# Patient Record
Sex: Female | Born: 1955
Health system: Southern US, Community
[De-identification: ages and names within clinical notes are randomized; demographics above are authoritative.]

## PROBLEM LIST (undated history)

## (undated) DIAGNOSIS — M1710 Unilateral primary osteoarthritis, unspecified knee: Secondary | ICD-10-CM

## (undated) DIAGNOSIS — C439 Malignant melanoma of skin, unspecified: Secondary | ICD-10-CM

## (undated) DIAGNOSIS — E7800 Pure hypercholesterolemia, unspecified: Secondary | ICD-10-CM

## (undated) DIAGNOSIS — R002 Palpitations: Secondary | ICD-10-CM

## (undated) DIAGNOSIS — I10 Essential (primary) hypertension: Secondary | ICD-10-CM

## (undated) DIAGNOSIS — J189 Pneumonia, unspecified organism: Secondary | ICD-10-CM

## (undated) DIAGNOSIS — R011 Cardiac murmur, unspecified: Secondary | ICD-10-CM

## (undated) DIAGNOSIS — N952 Postmenopausal atrophic vaginitis: Secondary | ICD-10-CM

## (undated) DIAGNOSIS — M1990 Unspecified osteoarthritis, unspecified site: Secondary | ICD-10-CM

## (undated) DIAGNOSIS — E538 Deficiency of other specified B group vitamins: Secondary | ICD-10-CM

## (undated) DIAGNOSIS — M8580 Other specified disorders of bone density and structure, unspecified site: Secondary | ICD-10-CM

## (undated) HISTORY — DX: Unilateral primary osteoarthritis, unspecified knee: M17.10

## (undated) HISTORY — DX: Postmenopausal atrophic vaginitis: N95.2

## (undated) HISTORY — DX: Malignant melanoma of skin, unspecified: C43.9

## (undated) HISTORY — DX: Palpitations: R00.2

## (undated) HISTORY — DX: Pure hypercholesterolemia, unspecified: E78.00

## (undated) HISTORY — DX: Essential (primary) hypertension: I10

## (undated) HISTORY — PX: JOINT REPLACEMENT: SHX530

---
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
## 1999-07-10 ENCOUNTER — Other Ambulatory Visit: Admission: RE | Admit: 1999-07-10 | Discharge: 1999-07-10 | Payer: Self-pay | Admitting: Obstetrics and Gynecology

## 2000-12-05 ENCOUNTER — Emergency Department (HOSPITAL_COMMUNITY): Admission: EM | Admit: 2000-12-05 | Discharge: 2000-12-05 | Payer: Self-pay | Admitting: *Deleted

## 2000-12-11 ENCOUNTER — Ambulatory Visit (HOSPITAL_BASED_OUTPATIENT_CLINIC_OR_DEPARTMENT_OTHER): Admission: RE | Admit: 2000-12-11 | Discharge: 2000-12-11 | Payer: Self-pay | Admitting: Orthopedic Surgery

## 2002-12-25 ENCOUNTER — Encounter: Admission: RE | Admit: 2002-12-25 | Discharge: 2002-12-25 | Payer: Self-pay | Admitting: Obstetrics and Gynecology

## 2002-12-25 ENCOUNTER — Encounter: Payer: Self-pay | Admitting: Obstetrics and Gynecology

## 2003-01-04 ENCOUNTER — Encounter: Admission: RE | Admit: 2003-01-04 | Discharge: 2003-01-04 | Payer: Self-pay | Admitting: Obstetrics and Gynecology

## 2003-01-04 ENCOUNTER — Encounter: Payer: Self-pay | Admitting: Obstetrics and Gynecology

## 2004-03-10 ENCOUNTER — Encounter: Admission: RE | Admit: 2004-03-10 | Discharge: 2004-03-10 | Payer: Self-pay | Admitting: Obstetrics and Gynecology

## 2005-12-03 HISTORY — PX: WRIST SURGERY: SHX841

## 2007-01-03 ENCOUNTER — Encounter (INDEPENDENT_AMBULATORY_CARE_PROVIDER_SITE_OTHER): Payer: Self-pay | Admitting: Specialist

## 2007-01-03 ENCOUNTER — Observation Stay (HOSPITAL_COMMUNITY): Admission: EM | Admit: 2007-01-03 | Discharge: 2007-01-04 | Payer: Self-pay | Admitting: Emergency Medicine

## 2007-04-11 DIAGNOSIS — D229 Melanocytic nevi, unspecified: Secondary | ICD-10-CM

## 2007-04-11 HISTORY — DX: Melanocytic nevi, unspecified: D22.9

## 2007-12-04 HISTORY — PX: KNEE SURGERY: SHX244

## 2007-12-04 HISTORY — PX: APPENDECTOMY: SHX54

## 2008-04-07 ENCOUNTER — Emergency Department (HOSPITAL_COMMUNITY): Admission: EM | Admit: 2008-04-07 | Discharge: 2008-04-08 | Payer: Self-pay | Admitting: Emergency Medicine

## 2008-12-02 ENCOUNTER — Ambulatory Visit (HOSPITAL_BASED_OUTPATIENT_CLINIC_OR_DEPARTMENT_OTHER): Admission: RE | Admit: 2008-12-02 | Discharge: 2008-12-02 | Payer: Self-pay | Admitting: Orthopedic Surgery

## 2010-08-08 ENCOUNTER — Ambulatory Visit: Payer: Self-pay | Admitting: Cardiology

## 2010-10-10 ENCOUNTER — Ambulatory Visit: Payer: Self-pay | Admitting: Cardiology

## 2010-11-22 ENCOUNTER — Ambulatory Visit: Payer: Self-pay | Admitting: Cardiology

## 2011-04-17 NOTE — Op Note (Signed)
NAMESHALIAH, WANN           ACCOUNT NO.:  [id number]   MEDICAL RECORD NO.:  [id number]          PATIENT TYPE:  AMB   LOCATION:  DSC                          FACILITY:  [facility]   PHYSICIAN:  Loreta Ave, M.D. DATE OF BIRTH:  [birth year]   DATE OF PROCEDURE:  12/02/2008  DATE OF DISCHARGE:                               OPERATIVE REPORT   PREOPERATIVE DIAGNOSIS:  Left knee medial meniscus tear with  tricompartmental chondromalacia.   POSTOPERATIVE DIAGNOSIS:  Left knee medial meniscus tear with  tricompartmental chondromalacia with grade 4 changes over half of the  patellofemoral joint, portions of the medial compartment as well as  lateral tibial plateau.   PROCEDURE:  Left knee exam under anesthesia, arthroscopy, partial medial  meniscectomy, and tricompartmental chondroplasty.   SURGEON:  Loreta Ave, MD   ASSISTANT:  Genene Churn. Barry Dienes, [place]   ANESTHESIA:  Knee block with sedation.   SPECIMENS:  None.   CULTURES:  None.   COMPLICATIONS:  None.   DRESSING:  Sterile compressive.   PROCEDURE:  The patient was brought to the operating room and placed on  the operating table in the supine position.  After adequate anesthesia  had been obtained, knee was examined.  Good motion, good stability, good  patellofemoral tracking.  Tourniquet and leg holder were applied.  Leg  was prepped and draped in the usual sterile fashion.  Three portals were  created, one superolateral, one each medial and lateral parapatellar.  Inflow catheter was introduced.  The standard arthroscope was introduced  and knee inspected.  Good patellofemoral tracking.  Diffuse grade 3  changes.  Chondroplasty.  Unfortunately, grade 4 over half of the  patellofemoral joint, medial trochlea, and medial patella.  Cruciate  ligaments were intact.  Lateral meniscus was not torn.  Lateral femoral  condyle looked good.  Some focal deep grade 3, almost grade 4 changes on  half of the plateau was debrided.   Medial compartment revealed complete  detachment of posterior horn of medial meniscus.  Once all stress out,  we removed the most of posterior half, tapered into remaining meniscus.  Grade 3 change throughout compartment and debrided.  Some focal grade 4  changes on the tibia and femur.  Because of the diffuse nature,  microfracture was not indicated.  At completion, all recesses were  examined and all loose fragments were removed.  Instruments and fluid  were  removed.  Portals of knee were injected with Marcaine.  Portals were  closed with 4-0 nylon.  Sterile compressive dressing was applied.  Anesthesia was reversed.  Brought to the recovery room.  Tolerated the  surgery well.  No complications.      Loreta Ave, M.D.  Electronically Signed     DFM/MEDQ  D:  12/02/2008  T:  12/03/2008  Job:  [id number]

## 2011-04-20 NOTE — Op Note (Signed)
Elizabeth Armstrong, Elizabeth Armstrong           ACCOUNT NO.:  [id number]   MEDICAL RECORD NO.:  [id number]          PATIENT TYPE:  INP   LOCATION:  0098                         FACILITY:  [facility]   PHYSICIAN:  Velora Heckler, MD      DATE OF BIRTH:  01-08-1956   DATE OF PROCEDURE:  01/03/2007  DATE OF DISCHARGE:                               OPERATIVE REPORT   PREOPERATIVE DIAGNOSIS:  Acute appendicitis.   POSTOPERATIVE DIAGNOSIS:  Acute appendicitis.   PROCEDURE:  Laparoscopic appendectomy.   SURGEON:  Velora Heckler, M.D., FACS   ANESTHESIA:  General per Dr. Sherrian Divers   ESTIMATED BLOOD LOSS:  Minimal.   PREPARATION:  Betadine.   COMPLICATIONS:  None.   INDICATIONS:  The patient is a 55 year old white female from [place],  [place].  She presents to the emergency department with a five  day history of abdominal discomfort.  The patient had been previously  evaluated on the day of admission by her primary care physician.  She  was sent to [facility] Radiology where a CT scan of the abdomen and  pelvis showed signs of acute appendicitis.  The patient was referred to  [facility] Emergency Department for general surgical evaluation and  management.  The patient was seen and evaluated in the emergency  department.  Physical examination and radiographic findings were  consistent with acute appendicitis.  The patient was prepared and  brought to the operating room.   BODY OF REPORT:  The procedure is done in OR number 1 at the [facility].  The patient is brought to the operating room and placed in a  supine position on the operating room table.  Following administration  of general anesthesia, the patient is prepped and draped in the usual  strict aseptic fashion.  After ascertaining that an adequate level of  anesthesia had been obtained, an infraumbilical incision was made with a  #15 blade.  Dissection was carried down to the fascia.  The fascia is  incised in  the midline and the peritoneal cavity is entered cautiously.  A 0 Vicryl pursestring suture is placed in the fascia.  A Hassan cannula  was introduced and secured with a pursestring suture.  The abdomen was  insufflated with carbon dioxide.  The laparoscope was introduced.  An  operative port is placed in the epigastrium and a second operative port  in the left lower quadrant.  Adhesions to the anterior abdominal wall  were transected with the harmonic scalpel.  The cecum was mobilized.  The small bowel was mobilized off of an inflammatory mass in the right  lower quadrant.  The appendix is exposed.  The appendix was turgid,  distended, consistent with acute appendicitis.  There is no sign of  perforation.  The appendix is mobilized from its lateral peritoneal  adhesions.  It is gently elevated.  The mesoappendix is taken down using  the harmonic scalpel.  Dissection was carried down to the base of the  appendix.  Good visualization of the base of the appendix at its  junction with the cecal wall was obtained.  An endo-GIA stapler was then  inserted and placed across the base of the appendix and fired.  The  appendix was retrieved and placed into an EndoCatch bag and withdrawn  through the left lower quadrant port without difficulty.  The right  lower quadrant was copiously irrigated with warm saline which was  evacuated.  The staple line was inspected.  It is properly positioned on  the cecal wall.  There is no residual appendix.  There is good  hemostasis along the staple line.  Fluid was evacuated from the  peritoneal cavity.  The ports were removed under direct vision and good  hemostasis was noted at all port sites.  Pneumoperitoneum was released  when all ports were removed.  The 0 Vicryl pursestring suture is tied  securely.  The port sites are anesthetized with local anesthetic.  The  wounds were closed with interrupted 4-0 Vicryl subcuticular sutures.  The wounds were washed and  dried and Benzoin and Steri-Strips are  applied.  Sterile dressings were applied.  The patient is awakened from  anesthesia and brought to the recovery room in stable condition.  The  patient tolerated the procedure well.      Velora Heckler, MD  Electronically Signed     TMG/MEDQ  D:  01/03/2007  T:  01/03/2007  Job:  [id number]   cc:   Gloriajean Dell. Andrey Campanile, M.D.  Fax: [phone]

## 2011-04-20 NOTE — Consult Note (Signed)
NAMEJADAH, BOBAK NO.:  [id number]   MEDICAL RECORD NO.:  [id number]          PATIENT TYPE:  EMS   LOCATION:  ED                           FACILITY:  [facility]   PHYSICIAN:  Velora Heckler, MD      DATE OF BIRTH:  [birth year]   DATE OF CONSULTATION:  01/03/2007  DATE OF DISCHARGE:                                 CONSULTATION   CHIEF COMPLAINT:  Abdominal pain, acute appendicitis.   HISTORY OF PRESENT ILLNESS:  Elizabeth Armstrong is a 55 year old white  female from [place], [place].  She presents at the request of  Dr. Benedetto Goad for evaluation of acute appendicitis.  The patient  reports a 5-day illness with mild abdominal discomfort and mild  diarrhea.  She was evaluated, today, by her primary physician.  She was  sent to [facility] Radiology where a CT scan abdomen and pelvis was  obtained.  By report, findings were consistent with acute appendicitis  with a thickened appendix measuring up to 18 mm in diameter.  The  patient was directed to [facility] Emergency Department for general  surgical evaluation and management.  The patient denies any nausea or  vomiting.  She denies any fever or chills.  She has had mild diarrhea.  She localizes significant pain to the right lower quadrant.   PAST MEDICAL HISTORY:  1. Status post cesarean section.  2. Status post ORIF right wrist by Dr. Mckinley Jewel.   MEDICATIONS:  None.   ALLERGIES:  None known.   SOCIAL HISTORY:  The patient is married.  She is to children.  She lives  in [place].  She works as a Transport planner for [employer].  She does not smoke.  She drinks a glass of wine a day.   FAMILY HISTORY:  Notable for coronary artery disease in both parents   REVIEW OF SYSTEMS:  A 15-system review discussed with the patient  without significant other findings.   PHYSICAL EXAMINATION:  GENERAL:  A 55 year old thin white female in no  acute distress on a stretcher in the emergency  department.  VITAL SIGNS:  Temperature 97.8, pulse 65, respirations 20, blood  pressure 136/91, O2 saturation 98% room air.  HEENT:  Shows her to be normocephalic, atraumatic.  Sclerae clear  conjunctiva are clear.  Pupils equal and reactive.  Dentition good.  Mucous membranes moist.  Voice normal.  NECK:  Anterior examination of the neck shows it to be symmetric;  palpation shows no thyroid nodules.  No lymphadenopathy, no tenderness.  LUNGS:  Clear to auscultation bilaterally without rales, rhonchi, or  wheeze.  CARDIAC EXAM:  Shows regular rate and rhythm without murmur.  Peripheral  pulses are full.  EXTREMITIES:  Nontender without edema.  Abdomen is soft without  distension bowel sounds are present.  However, there is marked  tenderness to both percussion and palpation of the right lower quadrant.  There is referred tenderness to the right lower quadrant.  There is  guarding.  There is rebound tenderness.  There is no sign of hernia.  There are no prior surgical wounds, except consistent  with cesarean  section.   LABORATORY STUDIES:  White count 9.1, hemoglobin 13.1, hematocrit 37.8%,  platelet count 321,000.  Differential is normal with 73% neutrophils,  19% lymphocytes, electrolytes are normal.  Prothrombin time is normal at  13.9 with an INR of 1.1, and a PTT of 35.   EKG:  Shows normal sinus rhythm without acute changes.   IMPRESSION:  Acute appendicitis.   PLAN:  1. Admission to [facility].  2. Initiation of intravenous antibiotics.  3. To operating room for appendectomy.  4. Routine postoperative care.   I discussed with the patient the indications for surgery.  I explained  laparoscopic technique versus open surgery.  I quoted her an  approximately a 90% chance of success by laparoscopic technique; and  approximately a 10% chance to conversion to open surgery.  I explained  the need for perioperative antibiotics.  We discussed the hospital stay  to be  expected; and her recovery following surgery.  She understands,  and wishes to proceed.  Will make arrangements with the operating room  immediately.      Velora Heckler, MD  Electronically Signed     TMG/MEDQ  D:  01/03/2007  T:  01/03/2007  Job:  [id number]   cc:   Gloriajean Dell. Andrey Campanile, M.D.  Fax: [phone]

## 2011-06-01 ENCOUNTER — Other Ambulatory Visit: Payer: Self-pay | Admitting: *Deleted

## 2011-09-07 LAB — POCT I-STAT, CHEM 8
BUN: 18 mg/dL (ref 6–23)
Calcium, Ion: 1.17 mmol/L (ref 1.12–1.32)
Chloride: 103 mEq/L (ref 96–112)
HCT: 42 % (ref 36.0–46.0)
Sodium: 139 mEq/L (ref 135–145)

## 2011-09-07 LAB — POCT HEMOGLOBIN-HEMACUE: Hemoglobin: 14 g/dL (ref 12.0–15.0)

## 2011-10-04 ENCOUNTER — Other Ambulatory Visit: Payer: Self-pay | Admitting: Cardiology

## 2011-10-31 ENCOUNTER — Telehealth: Payer: Self-pay | Admitting: Cardiology

## 2011-10-31 NOTE — Telephone Encounter (Signed)
Pt Called asking if Records have been sent to Her primary Doc, I have NO ROI,and have Checked [place] Card Chart No ROI in there neither, I have called Pt back LMOVM to return my call so we can get ROI Signed 10/31/11/km

## 2011-10-31 NOTE — Telephone Encounter (Signed)
All Cardiac (LOV,EKG) faxed to [facility] [place] @ [phone]   10/31/11/km

## 2011-11-03 ENCOUNTER — Other Ambulatory Visit: Payer: Self-pay | Admitting: Cardiology

## 2012-01-16 ENCOUNTER — Other Ambulatory Visit: Payer: Self-pay | Admitting: Cardiology

## 2012-02-11 ENCOUNTER — Other Ambulatory Visit: Payer: Self-pay | Admitting: Cardiology

## 2012-04-11 ENCOUNTER — Encounter: Payer: Self-pay | Admitting: *Deleted

## 2012-08-20 ENCOUNTER — Encounter: Payer: Self-pay | Admitting: *Deleted

## 2012-10-25 ENCOUNTER — Encounter (HOSPITAL_BASED_OUTPATIENT_CLINIC_OR_DEPARTMENT_OTHER): Payer: Self-pay | Admitting: *Deleted

## 2012-10-25 ENCOUNTER — Emergency Department (HOSPITAL_BASED_OUTPATIENT_CLINIC_OR_DEPARTMENT_OTHER)
Admission: EM | Admit: 2012-10-25 | Discharge: 2012-10-25 | Disposition: A | Discharge status: Home / Self Care | Payer: PRIVATE HEALTH INSURANCE | Attending: Emergency Medicine | Admitting: Emergency Medicine

## 2012-10-25 DIAGNOSIS — I10 Essential (primary) hypertension: Secondary | ICD-10-CM | POA: Insufficient documentation

## 2012-10-25 DIAGNOSIS — E7800 Pure hypercholesterolemia, unspecified: Secondary | ICD-10-CM | POA: Insufficient documentation

## 2012-10-25 DIAGNOSIS — Z23 Encounter for immunization: Secondary | ICD-10-CM | POA: Insufficient documentation

## 2012-10-25 DIAGNOSIS — Y929 Unspecified place or not applicable: Secondary | ICD-10-CM | POA: Insufficient documentation

## 2012-10-25 DIAGNOSIS — IMO0001 Reserved for inherently not codable concepts without codable children: Secondary | ICD-10-CM

## 2012-10-25 DIAGNOSIS — W268XXA Contact with other sharp object(s), not elsewhere classified, initial encounter: Secondary | ICD-10-CM | POA: Insufficient documentation

## 2012-10-25 DIAGNOSIS — Z79899 Other long term (current) drug therapy: Secondary | ICD-10-CM | POA: Insufficient documentation

## 2012-10-25 DIAGNOSIS — Y9301 Activity, walking, marching and hiking: Secondary | ICD-10-CM | POA: Insufficient documentation

## 2012-10-25 DIAGNOSIS — S61209A Unspecified open wound of unspecified finger without damage to nail, initial encounter: Secondary | ICD-10-CM | POA: Insufficient documentation

## 2012-10-25 MED ORDER — LIDOCAINE-EPINEPHRINE 2 %-1:100000 IJ SOLN
INTRAMUSCULAR | Status: AC
  Administered 2012-10-25: 15:00:00
  Filled 2012-10-25: qty 1

## 2012-10-25 MED ORDER — TETANUS-DIPHTH-ACELL PERTUSSIS 5-2.5-18.5 LF-MCG/0.5 IM SUSP
0.5000 mL | Freq: Once | INTRAMUSCULAR | Status: AC
  Administered 2012-10-25: 0.5 mL via INTRAMUSCULAR
  Filled 2012-10-25: qty 0.5

## 2012-10-25 NOTE — ED Provider Notes (Signed)
History     CSN: [id number]  Arrival date & time 10/25/12  1415   First MD Initiated Contact with Patient 10/25/12 1421      Chief Complaint  Patient presents with  . Laceration    (Consider location/radiation/quality/duration/timing/severity/associated sxs/prior treatment) HPI Pt presents with complaints of a R index finger laceration that occurred within one hour of presentation. Pt states she was walking her dog when she lost control of the dog, the leash slipped, and cut her R index finger. She states pain is minimal at time of assessment. She denies any other complaints. She is unsure of when she last had a tetanus vaccine.   Past Medical History  Diagnosis Date  . Hypercholesteremia   . HTN (hypertension)     Past Surgical History  Procedure Date  . Cesarean section   . Knee surgery 2009    left  . Wrist surgery 2007    right  . Appendectomy 2009    Family History  Problem Relation Age of Onset  . Heart disease      History  Substance Use Topics  . Smoking status: Never Smoker   . Smokeless tobacco: Not on file  . Alcohol Use: 1.8 oz/week    3 Glasses of wine per week    OB History    Grav Para Term Preterm Abortions TAB SAB Ect Mult Living                  Review of Systems  All other systems reviewed and are negative.    Allergies  Review of patient's allergies indicates no known allergies.  Home Medications   Current Outpatient Rx  Name  Route  Sig  Dispense  Refill  . ATORVASTATIN CALCIUM 10 MG PO TABS      TAKE 1 TABLET BY MOUTH EVERY DAY   30 tablet   5   . BYSTOLIC 5 MG PO TABS      TAKE 1 TABLET EVERY DAY   30 tablet   1     FURTHER REFILLS BY PCP   . HYDROCHLOROTHIAZIDE 25 MG PO TABS   Oral   Take 25 mg by mouth daily.         Marland Kitchen ONE-DAILY MULTI VITAMINS PO TABS   Oral   Take 1 tablet by mouth daily.           BP 141/82  Pulse 60  Temp 97.9 F (36.6 C) (Oral)  Resp 20  Ht 5\' 4"  (1.626 m)  Wt 155 lb  (70.308 kg)  BMI 26.61 kg/m2  SpO2 99%  Physical Exam  Constitutional: She is oriented to person, place, and time. She appears well-developed and well-nourished. No distress.  HENT:  Head: Normocephalic and atraumatic.  Eyes: Pupils are equal, round, and reactive to light.  Neck: Normal range of motion. No tracheal deviation present.  Cardiovascular: Normal rate and regular rhythm.   No murmur heard. Pulmonary/Chest: Effort normal. She has no wheezes. She has no rales.  Abdominal: Soft. Bowel sounds are normal. She exhibits no distension. There is no tenderness.  Musculoskeletal: Normal range of motion. She exhibits no edema.  Neurological: She is alert and oriented to person, place, and time.  Skin:       2cm horizontal laceration along PIP of R index finger. No associated erythema or edema. No underlying connective tissue visible.   Psychiatric: She has a normal mood and affect. Her behavior is normal.    ED Course  Procedures (  including critical care time)  LACERATION REPAIR Performed by: Yanira Tolsma R Authorized by: Kaytelynn Scripter R Consent: Verbal consent obtained. Risks and benefits: risks, benefits and alternatives were discussed Consent given by: patient Patient identity confirmed: provided demographic data Prepped and Draped in normal sterile fashion Wound explored  Laceration Location: R index finger at PIP  Laceration Length: 2cm  No Foreign Bodies seen or palpated  Anesthesia: local infiltration  Local anesthetic: lidocaine 2% with epinephrine  Anesthetic total: 5 ml  Irrigation method: syringe Amount of cleaning: standard  Skin closure: 5-0 ethilon  Number of sutures: 5  Technique: simple interrupted stitch  Patient tolerance: Patient tolerated the procedure well with no immediate complications.  Labs Reviewed - No data to display No results found.   1. Laceration of second finger, right       MDM  Pt's laceration was sutured as  described above. Pt received Tdap vaccinatino as she was unsure as to when her last tetanus vaccine took place. Pt instructed to fu with PCP in 7-10 days for suture removal. Pt instructed to return to ED if she experiences significant swelling, erythema, worsening pain, fever, or other signs of infection.         Elfredia Nevins, MD 10/25/12 1520

## 2012-10-25 NOTE — ED Notes (Signed)
Pt states she was walking the dog with a retractable leash and it cut her right index finger. Approx 2 cm lac to same. Bleeding controlled.

## 2012-10-26 NOTE — ED Provider Notes (Signed)
I reviewed the resident's note and I agree with the findings and plan.     Nelia Shi, MD 10/26/12 [phone]

## 2012-11-05 ENCOUNTER — Other Ambulatory Visit: Payer: Self-pay

## 2012-11-05 MED ORDER — ATORVASTATIN CALCIUM 10 MG PO TABS: 10.0000 mg | ORAL_TABLET | Freq: Every day | ORAL | Status: DC

## 2012-11-06 ENCOUNTER — Other Ambulatory Visit: Payer: Self-pay

## 2012-11-06 MED ORDER — ATORVASTATIN CALCIUM 10 MG PO TABS: 10.0000 mg | ORAL_TABLET | Freq: Every day | ORAL | Status: DC

## 2013-11-05 LAB — HM DEXA SCAN

## 2014-01-15 ENCOUNTER — Other Ambulatory Visit: Payer: Self-pay | Admitting: Physician Assistant

## 2014-01-15 DIAGNOSIS — D039 Melanoma in situ, unspecified: Secondary | ICD-10-CM

## 2014-01-15 DIAGNOSIS — C4491 Basal cell carcinoma of skin, unspecified: Secondary | ICD-10-CM

## 2014-01-15 HISTORY — DX: Melanoma in situ, unspecified: D03.9

## 2014-01-15 HISTORY — DX: Basal cell carcinoma of skin, unspecified: C44.91

## 2014-04-01 LAB — HM MAMMOGRAPHY

## 2014-10-20 ENCOUNTER — Encounter: Payer: Self-pay | Admitting: Family

## 2014-11-19 ENCOUNTER — Encounter: Payer: Self-pay | Admitting: Family

## 2014-11-19 ENCOUNTER — Ambulatory Visit (INDEPENDENT_AMBULATORY_CARE_PROVIDER_SITE_OTHER): Payer: PRIVATE HEALTH INSURANCE | Admitting: Family

## 2014-11-19 VITALS — BP 118/80 | HR 67 | Ht 64.0 in | Wt 132.0 lb

## 2014-11-19 DIAGNOSIS — E7800 Pure hypercholesterolemia, unspecified: Secondary | ICD-10-CM

## 2014-11-19 DIAGNOSIS — M179 Osteoarthritis of knee, unspecified: Secondary | ICD-10-CM

## 2014-11-19 DIAGNOSIS — R300 Dysuria: Secondary | ICD-10-CM

## 2014-11-19 DIAGNOSIS — IMO0002 Reserved for concepts with insufficient information to code with codable children: Secondary | ICD-10-CM

## 2014-11-19 DIAGNOSIS — M1990 Unspecified osteoarthritis, unspecified site: Secondary | ICD-10-CM

## 2014-11-19 DIAGNOSIS — Z0000 Encounter for general adult medical examination without abnormal findings: Secondary | ICD-10-CM

## 2014-11-19 DIAGNOSIS — M1710 Unilateral primary osteoarthritis, unspecified knee: Secondary | ICD-10-CM

## 2014-11-19 DIAGNOSIS — I10 Essential (primary) hypertension: Secondary | ICD-10-CM

## 2014-11-19 HISTORY — DX: Essential (primary) hypertension: I10

## 2014-11-19 HISTORY — DX: Reserved for concepts with insufficient information to code with codable children: IMO0002

## 2014-11-19 HISTORY — DX: Unspecified osteoarthritis, unspecified site: M19.90

## 2014-11-19 HISTORY — DX: Pure hypercholesterolemia, unspecified: E78.00

## 2014-11-19 LAB — POCT URINALYSIS DIPSTICK
BILIRUBIN UA: NEGATIVE
Glucose, UA: NEGATIVE
KETONES UA: NEGATIVE
Nitrite, UA: NEGATIVE
PH UA: 5.5
Protein, UA: NEGATIVE
SPEC GRAV UA: 1.025
Urobilinogen, UA: 0.2

## 2014-11-19 LAB — CBC WITH DIFFERENTIAL/PLATELET
BASOS PCT: 0.8 % (ref 0.0–3.0)
Basophils Absolute: 0.1 10*3/uL (ref 0.0–0.1)
EOS PCT: 2.2 % (ref 0.0–5.0)
Eosinophils Absolute: 0.2 10*3/uL (ref 0.0–0.7)
HEMATOCRIT: 41.3 % (ref 36.0–46.0)
HEMOGLOBIN: 13.6 g/dL (ref 12.0–15.0)
LYMPHS ABS: 2.4 10*3/uL (ref 0.7–4.0)
Lymphocytes Relative: 33.7 % (ref 12.0–46.0)
MCHC: 32.9 g/dL (ref 30.0–36.0)
MCV: 99.3 fl (ref 78.0–100.0)
MONOS PCT: 9 % (ref 3.0–12.0)
Monocytes Absolute: 0.7 10*3/uL (ref 0.1–1.0)
NEUTROS ABS: 3.9 10*3/uL (ref 1.4–7.7)
Neutrophils Relative %: 54.3 % (ref 43.0–77.0)
Platelets: 282 10*3/uL (ref 150.0–400.0)
RBC: 4.15 Mil/uL (ref 3.87–5.11)
RDW: 15.2 % (ref 11.5–15.5)
WBC: 7.2 10*3/uL (ref 4.0–10.5)

## 2014-11-19 LAB — COMPREHENSIVE METABOLIC PANEL
ALT: 12 U/L (ref 0–35)
AST: 19 U/L (ref 0–37)
Albumin: 4.4 g/dL (ref 3.5–5.2)
Alkaline Phosphatase: 81 U/L (ref 39–117)
BUN: 20 mg/dL (ref 6–23)
CALCIUM: 9.6 mg/dL (ref 8.4–10.5)
CHLORIDE: 104 meq/L (ref 96–112)
CO2: 26 meq/L (ref 19–32)
CREATININE: 0.7 mg/dL (ref 0.4–1.2)
GFR: 92.06 mL/min (ref >60.00)
Glucose, Bld: 102 mg/dL — ABNORMAL HIGH (ref 70–99)
Potassium: 3.9 mEq/L (ref 3.5–5.1)
Sodium: 139 mEq/L (ref 135–145)
Total Bilirubin: 0.8 mg/dL (ref 0.2–1.2)
Total Protein: 6.8 g/dL (ref 6.0–8.3)

## 2014-11-19 LAB — LIPID PANEL
Cholesterol: 215 mg/dL — ABNORMAL HIGH (ref 0–200)
HDL: 90.7 mg/dL (ref >39.00)
LDL Cholesterol: 112 mg/dL — ABNORMAL HIGH (ref 0–99)
NONHDL: 124.3
Total CHOL/HDL Ratio: 2
Triglycerides: 64 mg/dL (ref 0.0–149.0)
VLDL: 12.8 mg/dL (ref 0.0–40.0)

## 2014-11-19 NOTE — Addendum Note (Signed)
Addended by: Elmer Picker on: 11/19/2014 01:40 PM   Modules accepted: Orders

## 2014-11-19 NOTE — Patient Instructions (Signed)
Exercise to Stay Healthy Exercise helps you become and stay healthy. EXERCISE IDEAS AND TIPS Choose exercises that:  You enjoy.  Fit into your day. You do not need to exercise really hard to be healthy. You can do exercises at a slow or medium level and stay healthy. You can:  Stretch before and after working out.  Try yoga, Pilates, or tai chi.  Lift weights.  Walk fast, swim, jog, run, climb stairs, bicycle, dance, or rollerskate.  Take aerobic classes. Exercises that burn about 150 calories:  Running 1  miles in 15 minutes.  Playing volleyball for 45 to 60 minutes.  Washing and waxing a car for 45 to 60 minutes.  Playing touch football for 45 minutes.  Walking 1  miles in 35 minutes.  Pushing a stroller 1  miles in 30 minutes.  Playing basketball for 30 minutes.  Raking leaves for 30 minutes.  Bicycling 5 miles in 30 minutes.  Walking 2 miles in 30 minutes.  Dancing for 30 minutes.  Shoveling snow for 15 minutes.  Swimming laps for 20 minutes.  Walking up stairs for 15 minutes.  Bicycling 4 miles in 15 minutes.  Gardening for 30 to 45 minutes.  Jumping rope for 15 minutes.  Washing windows or floors for 45 to 60 minutes. Document Released: 12/22/2010 Document Revised: 02/11/2012 Document Reviewed: 12/22/2010 [facility] Patient Information 2015 [place], [place]. This information is not intended to replace advice given to you by your health care provider. Make sure you discuss any questions you have with your health care provider.

## 2014-11-19 NOTE — Progress Notes (Signed)
Pre visit review using our clinic review tool, if applicable. No additional management support is needed unless otherwise documented below in the visit note. 

## 2014-11-19 NOTE — Progress Notes (Signed)
Subjective:    Patient ID: Elizabeth Armstrong, female    DOB: February 09, 1956, 58 y.o.   MRN: [id number]  HPI  58 year old white female, nonsmoker with a history of osteoarthritis of the knees, status post bilateral knee replacement is in today as a new patient for complete physical exam. She has a history of hypertension and hyperlipidemia. Not taking any statin medications currently. Does not routinely exercise. See gynecology for Pap smear and pelvic exams. Mammogram up-to-date.  Review of Systems  Constitutional: Negative.   HENT: Negative.   Eyes: Negative.   Respiratory: Negative.   Cardiovascular: Negative.   Gastrointestinal: Negative.   Endocrine: Negative.   Genitourinary: Negative.   Musculoskeletal: Negative.   Skin: Negative.   Allergic/Immunologic: Negative.   Neurological: Negative.   Hematological: Negative.   Psychiatric/Behavioral: Negative.    Past Medical History  Diagnosis Date  . Hypercholesteremia   . HTN (hypertension)     History   Social History  . Marital Status: Married    Spouse Name: N/A    Number of Children: 1  . Years of Education: N/A   Occupational History  . Tree surgeon    Social History Main Topics  . Smoking status: Never Smoker   . Smokeless tobacco: Not on file  . Alcohol Use: 1.8 oz/week    3 Glasses of wine per week  . Drug Use: Not on file  . Sexual Activity: Not on file   Other Topics Concern  . Not on file   Social History Narrative    Past Surgical History  Procedure Laterality Date  . Cesarean section    . Knee surgery  2009    left  . Wrist surgery  2007    right  . Appendectomy  2009    Family History  Problem Relation Age of Onset  . Heart disease      No Known Allergies  Current Outpatient Prescriptions on File Prior to Visit  Medication Sig Dispense Refill  . BYSTOLIC 5 MG tablet TAKE 1 TABLET EVERY DAY 30 tablet 1   No current facility-administered medications on file prior to visit.     BP 118/80 mmHg  Pulse 67  Ht 5\' 4"  (1.626 m)  Wt 132 lb (59.875 kg)  BMI 22.65 kg/m2chart    Objective:   Physical Exam  Constitutional: She is oriented to person, place, and time. She appears well-developed and well-nourished.  HENT:  Right Ear: External ear normal.  Left Ear: External ear normal.  Nose: Nose normal.  Mouth/Throat: Oropharynx is clear and moist.  Eyes: Pupils are equal, round, and reactive to light.  Neck: Normal range of motion. Neck supple. No thyromegaly present.  Cardiovascular: Normal rate, regular rhythm and normal heart sounds.   Pulmonary/Chest: Effort normal and breath sounds normal.  Abdominal: Soft. Bowel sounds are normal.  Musculoskeletal: Normal range of motion.  Neurological: She is alert and oriented to person, place, and time. She has normal reflexes.  Skin: Skin is warm and dry.  Psychiatric: She has a normal mood and affect.          Assessment & Plan:  Elizabeth Armstrong was seen today for establish care.  Diagnoses and associated orders for this visit:  Preventative health care - CMP - Lipid Panel - CBC with Differential - TSH - POC Urinalysis Dipstick - EKG 12-Lead  Essential hypertension - CMP - Lipid Panel - CBC with Differential - TSH - POC Urinalysis Dipstick - EKG 12-Lead  Pure hypercholesterolemia -  CMP - Lipid Panel - CBC with Differential - TSH - POC Urinalysis Dipstick - EKG 12-Lead  Osteoarthrosis, unspecified whether generalized or localized, involving lower leg - CMP - Lipid Panel - CBC with Differential - TSH - POC Urinalysis Dipstick    encouraged healthy diet and exercise. We'll follow-up  pending labs, in 6 months and sooner as needed.

## 2014-11-21 LAB — URINE CULTURE
Colony Count: NO GROWTH
ORGANISM ID, BACTERIA: NO GROWTH

## 2014-11-22 ENCOUNTER — Telehealth: Payer: Self-pay | Admitting: Family

## 2014-11-22 ENCOUNTER — Other Ambulatory Visit: Payer: PRIVATE HEALTH INSURANCE

## 2014-11-22 DIAGNOSIS — E038 Other specified hypothyroidism: Secondary | ICD-10-CM

## 2014-11-22 NOTE — Telephone Encounter (Signed)
emmi mailed  °

## 2014-11-23 LAB — TSH: TSH: 0.666 u[IU]/mL (ref 0.350–4.500)

## 2015-01-10 ENCOUNTER — Other Ambulatory Visit: Payer: Self-pay | Admitting: Family Medicine

## 2015-01-11 ENCOUNTER — Telehealth: Payer: Self-pay | Admitting: Family

## 2015-01-11 NOTE — Telephone Encounter (Signed)
I reviewed chart and it looks like she is pretty healthy and had complete physical in December so can make exception. Padonda advised follow up in 6 months at CPE in december. Ok to schedule follow up/transfer visit with me in June if she is ok with this or if no concerns can schedule CPE/new pt visit in December 2016 if she prefers.

## 2015-01-11 NOTE — Telephone Encounter (Signed)
Pt would like to est with dr Maudie Mercury however she does not want to make an appt until she has a problem or needs a cpx. Pt pays out of pocket. Pt is aware must est by April 2016. Please advise

## 2015-01-12 NOTE — Telephone Encounter (Signed)
Pt has been sch

## 2015-08-01 ENCOUNTER — Other Ambulatory Visit: Payer: Self-pay | Admitting: Family

## 2015-08-25 ENCOUNTER — Ambulatory Visit (INDEPENDENT_AMBULATORY_CARE_PROVIDER_SITE_OTHER): Payer: BLUE CROSS/BLUE SHIELD | Admitting: Family Medicine

## 2015-08-25 ENCOUNTER — Encounter: Payer: Self-pay | Admitting: Family Medicine

## 2015-08-25 VITALS — BP 132/88 | HR 68 | Temp 98.1°F | Ht 65.0 in | Wt 142.2 lb

## 2015-08-25 DIAGNOSIS — E7800 Pure hypercholesterolemia, unspecified: Secondary | ICD-10-CM

## 2015-08-25 DIAGNOSIS — I10 Essential (primary) hypertension: Secondary | ICD-10-CM

## 2015-08-25 DIAGNOSIS — R002 Palpitations: Secondary | ICD-10-CM

## 2015-08-25 DIAGNOSIS — C439 Malignant melanoma of skin, unspecified: Secondary | ICD-10-CM

## 2015-08-25 DIAGNOSIS — Z0000 Encounter for general adult medical examination without abnormal findings: Secondary | ICD-10-CM | POA: Diagnosis not present

## 2015-08-25 DIAGNOSIS — M1710 Unilateral primary osteoarthritis, unspecified knee: Secondary | ICD-10-CM

## 2015-08-25 DIAGNOSIS — N952 Postmenopausal atrophic vaginitis: Secondary | ICD-10-CM

## 2015-08-25 DIAGNOSIS — IMO0002 Reserved for concepts with insufficient information to code with codable children: Secondary | ICD-10-CM

## 2015-08-25 DIAGNOSIS — M179 Osteoarthritis of knee, unspecified: Secondary | ICD-10-CM | POA: Diagnosis not present

## 2015-08-25 HISTORY — DX: Postmenopausal atrophic vaginitis: N95.2

## 2015-08-25 HISTORY — DX: Palpitations: R00.2

## 2015-08-25 HISTORY — DX: Malignant melanoma of skin, unspecified: C43.9

## 2015-08-25 LAB — BASIC METABOLIC PANEL
BUN: 15 mg/dL (ref 6–23)
CALCIUM: 9.4 mg/dL (ref 8.4–10.5)
CO2: 26 meq/L (ref 19–32)
CREATININE: 0.79 mg/dL (ref 0.40–1.20)
Chloride: 103 mEq/L (ref 96–112)
GFR: 78.55 mL/min (ref >60.00)
GLUCOSE: 95 mg/dL (ref 70–99)
Potassium: 4.3 mEq/L (ref 3.5–5.1)
Sodium: 139 mEq/L (ref 135–145)

## 2015-08-25 LAB — LIPID PANEL
CHOLESTEROL: 215 mg/dL — AB (ref 0–200)
HDL: 100.6 mg/dL (ref >39.00)
LDL Cholesterol: 103 mg/dL — ABNORMAL HIGH (ref 0–99)
NONHDL: 114.51
TRIGLYCERIDES: 56 mg/dL (ref 0.0–149.0)
Total CHOL/HDL Ratio: 2
VLDL: 11.2 mg/dL (ref 0.0–40.0)

## 2015-08-25 MED ORDER — ESTROGENS, CONJUGATED 0.625 MG/GM VA CREA: 1.0000 | TOPICAL_CREAM | VAGINAL | Status: DC

## 2015-08-25 MED ORDER — NEBIVOLOL HCL 5 MG PO TABS: 5.0000 mg | ORAL_TABLET | Freq: Every day | ORAL | Status: DC

## 2015-08-25 NOTE — Progress Notes (Signed)
Pre visit review using our clinic review tool, if applicable. No additional management support is needed unless otherwise documented below in the visit note. 

## 2015-08-25 NOTE — Progress Notes (Signed)
HPI:  Here to establish a care and for CPE. Insisted on CPE even though done in last 1 year and reports not an issue for her insurance.  -Concerns and/or follow up today:  HTN/Palpitations/Hx mild HLD: -chronic -meds: bystolic, used to see cardiologist with remote eval with normal echo and stress test per her report -great diet and regular exercise -denies: CP, SOB, DOE, swelling, palpitations  Vaginal Atrophy: -uses premarin 1x per month  Hx Melanoma skin ca: -sees dermatologist at least 2x per year for this  OA knees and back: -sees Dr. Arvella Nigh at [facility] per her report -s/p bilat knee replacement -takes celebrex daily but doesn't think needs to  -Diet: variety of foods, balance and well rounded  -Exercise: regular exercise  -Taking folic acid, vitamin D or calcium: no  -Diabetes and Dyslipidemia Screening: FASTING today  -Vaccines: wants flu shot today, wants to check on cost of shingles vaccine  -pap history: done 10/2014 - reports normal  -FDLMP: n/a  -sexual activity: yes, female partner, no new partners  -wants STI testing (Hep C if born [birth year]): no, agreed to hep c screening  -FH breast, colon or ovarian ca: see FH Last mammogram: in last year, does yearly, had breast exam less then 1 year ago Last colon cancer screening: done, reports told to repeat in 5 year   -Alcohol, Tobacco, drug use: see social history  Review of Systems - no fevers, unintentional weight loss, vision loss, hearing loss, chest pain, sob, hemoptysis, melena, hematochezia, hematuria, genital discharge, changing or concerning skin lesions, bleeding, bruising, loc, thoughts of self harm or SI  Past Medical History  Diagnosis Date  . Hypercholesteremia   . HTN (hypertension)   . Osteoarthrosis, unspecified whether generalized or localized, involving lower leg 11/19/2014    of knees, s/p knee replacements bilat, sees [facility]  . Melanoma of skin 08/25/2015    sees dermatologist at  least twice per year, Dr. Denna Haggard  . Palpitations 08/25/2015  . Vaginal atrophy 08/25/2015    Past Surgical History  Procedure Laterality Date  . Cesarean section    . Knee surgery  2009    left  . Wrist surgery  2007    right  . Appendectomy  2009    Family History  Problem Relation Age of Onset  . Heart disease      Social History   Social History  . Marital Status: Married    Spouse Name: N/A  . Number of Children: 1  . Years of Education: N/A   Occupational History  . Tree surgeon    Social History Main Topics  . Smoking status: Never Smoker   . Smokeless tobacco: None  . Alcohol Use: 1.8 oz/week    3 Glasses of wine per week  . Drug Use: None  . Sexual Activity: Not Asked   Other Topics Concern  . 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  conjugated estrogens (PREMARIN) vaginal cream, Place 1 Applicatorful vaginally every 30 (thirty) days., Disp: 30 g, Rfl: 0 .  Magnesium 500 MG TABS, Take by mouth daily., Disp: , Rfl:  .  nebivolol (BYSTOLIC) 5 MG tablet, Take 1 tablet (5 mg total) by mouth  daily., Disp: 90 tablet, Rfl: 3  EXAM:  Filed Vitals:   08/25/15 0810  BP: 132/88  Pulse: 68  Temp: 98.1 F (36.7 C)    GENERAL: vitals reviewed and listed below, alert, oriented, appears well hydrated and in no acute distress  HEENT: head atraumatic, PERRLA, normal appearance of eyes, ears, nose and mouth. moist mucus membranes.  NECK: supple, no masses or lymphadenopathy  LUNGS: clear to auscultation bilaterally, no rales, rhonchi or wheeze  CV: HRRR, no peripheral edema or cyanosis, normal pedal pulses  BREAST: declined, done in last 1 year  ABDOMEN: bowel sounds normal, soft, non tender to palpation, no masses, no rebound or guarding  GU:  declined, done in last 1 year  RECTAL: declined  SKIN: declined, seeing derm this week  MS: normal gait, moves all extremities normally  NEURO: CN II-XII grossly intact, normal muscle strength and sensation to light touch on extremities  PSYCH: normal affect, pleasant and cooperative  ASSESSMENT AND PLAN:  Discussed the following assessment and plan:  Visit for preventive health examination - Plan: Hep C Antibody -Discussed and advised all [place] preventive services health task force level A and B recommendations for age, sex and risks.  -Advised at least 150 minutes of exercise per week and a healthy diet low in saturated fats and sweets and consisting of fresh fruits and vegetables, lean meats such as fish and white chicken and whole grains.  -FASTING labs, studies and vaccines per orders this encounter  Essential hypertension - Plan: Basic metabolic panel  Pure hypercholesterolemia - Plan: Lipid Panel  Osteoarthrosis, unspecified whether generalized or localized, involving lower leg  Melanoma of skin  Vaginal atrophy  Palpitations    Orders Placed This Encounter  Procedures  . Lipid Panel  . Basic metabolic panel  . Hep C Antibody    Patient advised to return to clinic immediately if symptoms worsen or persist or new concerns.  Patient Instructions  BEFORE YOU LEAVE: -flu shot -labs -schedule physical exam next year  -We have ordered labs or studies at this visit. It can take up to 1-2 weeks for results and processing. We will contact you with instructions IF your results are abnormal. Normal results will be released to your [facility]. If you have not heard from [place] or can not find your results in [facility] in 2 weeks please contact our office.  We recommend the following healthy lifestyle measures: - eat a healthy diet consisting of lots of vegetables, fruits, beans, nuts, seeds, healthy meats such as white chicken and fish and whole grains.  - avoid fried foods,  fast food, processed foods, sodas, red meet and other fattening foods.  - get a least 150 minutes of aerobic exercise per week.       No Follow-up on file.  Colin Benton R.

## 2015-08-25 NOTE — Patient Instructions (Addendum)
BEFORE YOU LEAVE: -flu shot -labs -schedule physical exam next year  -We have ordered labs or studies at this visit. It can take up to 1-2 weeks for results and processing. We will contact you with instructions IF your results are abnormal. Normal results will be released to your [facility]. If you have not heard from [place] or can not find your results in [facility] in 2 weeks please contact our office.  We recommend the following healthy lifestyle measures: - eat a healthy diet consisting of lots of vegetables, fruits, beans, nuts, seeds, healthy meats such as white chicken and fish and whole grains.  - avoid fried foods, fast food, processed foods, sodas, red meet and other fattening foods.  - get a least 150 minutes of aerobic exercise per week.

## 2015-08-26 LAB — HEPATITIS C ANTIBODY: HCV Ab: NEGATIVE

## 2015-08-29 ENCOUNTER — Other Ambulatory Visit: Payer: Self-pay | Admitting: Family

## 2015-08-31 NOTE — Telephone Encounter (Signed)
Received a refill request for Celebrex. I only see this medication on the patient's historical med list? Okay to refill?

## 2015-09-20 ENCOUNTER — Other Ambulatory Visit: Payer: Self-pay | Admitting: *Deleted

## 2015-11-15 ENCOUNTER — Other Ambulatory Visit: Payer: Self-pay | Admitting: Physician Assistant

## 2015-11-22 ENCOUNTER — Ambulatory Visit: Payer: PRIVATE HEALTH INSURANCE | Admitting: Family Medicine

## 2015-12-15 ENCOUNTER — Other Ambulatory Visit: Payer: Self-pay | Admitting: Physician Assistant

## 2016-02-04 ENCOUNTER — Other Ambulatory Visit: Payer: Self-pay | Admitting: Family Medicine

## 2016-02-27 ENCOUNTER — Ambulatory Visit (INDEPENDENT_AMBULATORY_CARE_PROVIDER_SITE_OTHER): Payer: BLUE CROSS/BLUE SHIELD | Admitting: *Deleted

## 2016-02-27 DIAGNOSIS — Z23 Encounter for immunization: Secondary | ICD-10-CM | POA: Diagnosis not present

## 2016-04-02 ENCOUNTER — Other Ambulatory Visit: Payer: Self-pay | Admitting: *Deleted

## 2016-04-02 NOTE — Telephone Encounter (Signed)
[facility] [phone] Rx request Celecoxib 200 mg, take one capsule by mouth twice daily, #60

## 2016-04-03 NOTE — Telephone Encounter (Signed)
Notification faxed to pharmacy

## 2016-04-03 NOTE — Telephone Encounter (Signed)
I believe her orthopedic doc prescribes this? I do not usually advise for regular long term use due to side effects and risks. Would suggest tylenol [id number] mg up to 3 times daily as needed for pain.

## 2016-08-18 ENCOUNTER — Other Ambulatory Visit: Payer: Self-pay | Admitting: Family Medicine

## 2016-09-05 ENCOUNTER — Ambulatory Visit (INDEPENDENT_AMBULATORY_CARE_PROVIDER_SITE_OTHER): Payer: BLUE CROSS/BLUE SHIELD

## 2016-09-05 DIAGNOSIS — Z23 Encounter for immunization: Secondary | ICD-10-CM

## 2016-09-17 ENCOUNTER — Other Ambulatory Visit: Payer: Self-pay | Admitting: Family Medicine

## 2016-09-18 ENCOUNTER — Other Ambulatory Visit: Payer: Self-pay | Admitting: *Deleted

## 2016-09-18 MED ORDER — NEBIVOLOL HCL 5 MG PO TABS: 5.0000 mg | ORAL_TABLET | Freq: Every day | ORAL | 0 refills | Status: DC

## 2016-09-18 NOTE — Telephone Encounter (Signed)
Rx done. 

## 2016-10-18 ENCOUNTER — Other Ambulatory Visit: Payer: Self-pay | Admitting: Family Medicine

## 2016-11-12 ENCOUNTER — Encounter: Payer: BLUE CROSS/BLUE SHIELD | Admitting: Family Medicine

## 2016-11-16 ENCOUNTER — Other Ambulatory Visit: Payer: Self-pay | Admitting: Family Medicine

## 2016-12-07 ENCOUNTER — Other Ambulatory Visit: Payer: Self-pay | Admitting: Physician Assistant

## 2016-12-12 ENCOUNTER — Other Ambulatory Visit: Payer: Self-pay | Admitting: Family Medicine

## 2016-12-31 ENCOUNTER — Encounter: Payer: Self-pay | Admitting: Family Medicine

## 2016-12-31 ENCOUNTER — Ambulatory Visit (INDEPENDENT_AMBULATORY_CARE_PROVIDER_SITE_OTHER): Payer: BLUE CROSS/BLUE SHIELD | Admitting: Family Medicine

## 2016-12-31 VITALS — BP 132/88 | HR 74 | Temp 98.1°F | Ht 65.0 in | Wt 148.3 lb

## 2016-12-31 DIAGNOSIS — J069 Acute upper respiratory infection, unspecified: Secondary | ICD-10-CM | POA: Diagnosis not present

## 2016-12-31 MED ORDER — BENZONATATE 100 MG PO CAPS: 100.0000 mg | ORAL_CAPSULE | Freq: Two times a day (BID) | ORAL | 0 refills | Status: DC | PRN

## 2016-12-31 NOTE — Progress Notes (Signed)
HPI:  URI: -started: 6 days ago -symptoms:nasal congestion, sore throat, cough, mild diarrhea -denies:fever, SOB, NVD, tooth pain -has tried: tylenol and sudafed -sick contacts/travel/risks: no reported flu, strep or tick exposure -has physical in a few days  ROS: See pertinent positives and negatives per HPI.  Past Medical History:  Diagnosis Date  . HTN (hypertension)   . Hypercholesteremia   . Melanoma of skin ([place]) 08/25/2015   sees dermatologist at least twice per year, Dr. Denna Haggard  . Osteoarthrosis, unspecified whether generalized or localized, involving lower leg 11/19/2014   of knees, s/p knee replacements bilat, sees [facility]  . Palpitations 08/25/2015  . Vaginal atrophy 08/25/2015    Past Surgical History:  Procedure Laterality Date  . APPENDECTOMY  2009  . CESAREAN SECTION    . KNEE SURGERY  2009   left  . WRIST SURGERY  2007   right    Family History  Problem Relation Age of Onset  . Heart disease      Social History   Social History  . Marital status: Married    Spouse name: N/A  . Number of children: 1  . Years of education: N/A   Occupational History  . Tree surgeon    Social History Main Topics  . Smoking status: Never Smoker  . Smokeless tobacco: Never Used  . Alcohol use 1.8 oz/week    3 Glasses of wine per week  . Drug use: Unknown  . Sexual activity: Not Asked   Other Topics Concern  . 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  BYSTOLIC 5 MG tablet, TAKE 1 TABLET (5 MG TOTAL) BY MOUTH DAILY., Disp: 30 tablet, Rfl: 0 .  Magnesium 500 MG TABS, Take by mouth daily., Disp: , Rfl:  .  PREMARIN vaginal cream, INSERT 1 APPLICATORFUL VAGINALLY EVERY 30 (THIRTY) DAYS., Disp: 30 g, Rfl: 1 .  benzonatate (TESSALON) 100 MG capsule,  Take 1 capsule (100 mg total) by mouth 2 (two) times daily as needed for cough., Disp: 20 capsule, Rfl: 0  EXAM:  Vitals:   12/31/16 1105  BP: 132/88  Pulse: 74  Temp: 98.1 F (36.7 C)    Body mass index is 24.68 kg/m.  GENERAL: vitals reviewed and listed above, alert, oriented, appears well hydrated and in no acute distress  HEENT: atraumatic, conjunttiva clear, no obvious abnormalities on inspection of external nose and ears, normal appearance of ear canals and TMs, clear nasal congestion, mild post oropharyngeal erythema with PND, no tonsillar edema or exudate, no sinus TTP  NECK: no obvious masses on inspection  LUNGS: clear to auscultation bilaterally, no wheezes, rales or rhonchi, good air movement  CV: HRRR, no peripheral edema  MS: moves all extremities without noticeable abnormality  PSYCH: pleasant and cooperative, no obvious depression or anxiety  ASSESSMENT AND PLAN:  Discussed the following assessment and plan:  Upper respiratory tract infection, unspecified type  -given HPI and exam findings today, a serious infection or illness is unlikely. We discussed potential etiologies, with VURI being most likely, and advised supportive care and monitoring. We discussed treatment side effects, likely course, antibiotic misuse, transmission, and signs of developing a serious illness. -of course, we advised  to return or notify a doctor immediately if symptoms worsen or persist or new concerns arise.    Patient Instructions  INSTRUCTIONS FOR UPPER RESPIRATORY INFECTION:  -plenty of rest and fluids  -nasal saline wash 2-3 times daily (use prepackaged nasal saline or bottled/distilled water if making your own)   -can use tylenol (in no history of liver disease) or ibuprofen (if no history of kidney disease, bowel bleeding or significant heart disease) as directed for aches and sorethroat  -in the winter time, using a humidifier at night is helpful (please follow  cleaning instructions)  -if you are taking a cough medication - use only as directed, may also try a teaspoon of honey to coat the throat and throat lozenges.   -for sore throat, salt water gargles can help  -follow up if you have fevers, facial pain, tooth pain, difficulty breathing or are worsening or symptoms persist longer then expected  Upper Respiratory Infection, Adult An upper respiratory infection (URI) is also known as the common cold. It is often caused by a type of germ (virus). Colds are easily spread (contagious). You can pass it to others by kissing, coughing, sneezing, or drinking out of the same glass. Usually, you get better in 1 to 3  weeks.  However, the cough can last for even longer. HOME CARE   Only take medicine as told by your doctor. Follow instructions provided above.  Drink enough water and fluids to keep your pee (urine) clear or pale yellow.  Get plenty of rest.  Return to work when your temperature is < 100 for 24 hours or as told by your doctor. You may use a face mask and wash your hands to stop your cold from spreading. GET HELP RIGHT AWAY IF:   After the first few days, you feel you are getting worse.  You have questions about your medicine.  You have chills, shortness of breath, or red spit (mucus).  You have pain in the face for more then 1-2 days, especially when you bend forward.  You have a fever, puffy (swollen) neck, pain when you swallow, or white spots in the back of your throat.  You have a bad headache, ear pain, sinus pain, or chest pain.  You have a high-pitched whistling sound when you breathe in and out (wheezing).  You cough up blood.  You have sore muscles or a stiff neck. MAKE SURE YOU:   Understand these instructions.  Will watch your condition.  Will get help right away if you are not doing well or get worse. Document Released: 05/07/2008 Document Revised: 02/11/2012 Document Reviewed: 02/24/2014 [facility] Patient  Information 2015 [place], [place]. This information is not intended to replace advice given to you by your health care provider. Make sure you discuss any questions you have with your health care provider.    Colin Benton R., DO

## 2016-12-31 NOTE — Patient Instructions (Signed)
INSTRUCTIONS FOR UPPER RESPIRATORY INFECTION:  -plenty of rest and fluids  -nasal saline wash 2-3 times daily (use prepackaged nasal saline or bottled/distilled water if making your own)   -can use tylenol (in no history of liver disease) or ibuprofen (if no history of kidney disease, bowel bleeding or significant heart disease) as directed for aches and sorethroat  -in the winter time, using a humidifier at night is helpful (please follow cleaning instructions)  -if you are taking a cough medication - use only as directed, may also try a teaspoon of honey to coat the throat and throat lozenges.   -for sore throat, salt water gargles can help  -follow up if you have fevers, facial pain, tooth pain, difficulty breathing or are worsening or symptoms persist longer then expected  Upper Respiratory Infection, Adult An upper respiratory infection (URI) is also known as the common cold. It is often caused by a type of germ (virus). Colds are easily spread (contagious). You can pass it to others by kissing, coughing, sneezing, or drinking out of the same glass. Usually, you get better in 1 to 3  weeks.  However, the cough can last for even longer. HOME CARE   Only take medicine as told by your doctor. Follow instructions provided above.  Drink enough water and fluids to keep your pee (urine) clear or pale yellow.  Get plenty of rest.  Return to work when your temperature is < 100 for 24 hours or as told by your doctor. You may use a face mask and wash your hands to stop your cold from spreading. GET HELP RIGHT AWAY IF:   After the first few days, you feel you are getting worse.  You have questions about your medicine.  You have chills, shortness of breath, or red spit (mucus).  You have pain in the face for more then 1-2 days, especially when you bend forward.  You have a fever, puffy (swollen) neck, pain when you swallow, or white spots in the back of your throat.  You have a bad  headache, ear pain, sinus pain, or chest pain.  You have a high-pitched whistling sound when you breathe in and out (wheezing).  You cough up blood.  You have sore muscles or a stiff neck. MAKE SURE YOU:   Understand these instructions.  Will watch your condition.  Will get help right away if you are not doing well or get worse. Document Released: 05/07/2008 Document Revised: 02/11/2012 Document Reviewed: 02/24/2014 [employer] Patient Information 2015 [employer]. This information is not intended to replace advice given to you by your health care provider. Make sure you discuss any questions you have with your health care provider.  

## 2016-12-31 NOTE — Progress Notes (Signed)
Pre visit review using our clinic review tool, if applicable. No additional management support is needed unless otherwise documented below in the visit note. 

## 2017-01-04 ENCOUNTER — Encounter: Payer: Self-pay | Admitting: Family Medicine

## 2017-01-04 ENCOUNTER — Ambulatory Visit (INDEPENDENT_AMBULATORY_CARE_PROVIDER_SITE_OTHER)
Admission: RE | Admit: 2017-01-04 | Discharge: 2017-01-04 | Disposition: A | Discharge status: Home / Self Care | Payer: BLUE CROSS/BLUE SHIELD | Source: Ambulatory Visit | Attending: Family Medicine | Admitting: Family Medicine

## 2017-01-04 ENCOUNTER — Ambulatory Visit (INDEPENDENT_AMBULATORY_CARE_PROVIDER_SITE_OTHER): Payer: BLUE CROSS/BLUE SHIELD | Admitting: Family Medicine

## 2017-01-04 VITALS — BP 122/80 | HR 52 | Temp 97.9°F | Ht 65.25 in | Wt 149.4 lb

## 2017-01-04 DIAGNOSIS — R059 Cough, unspecified: Secondary | ICD-10-CM

## 2017-01-04 DIAGNOSIS — M8580 Other specified disorders of bone density and structure, unspecified site: Secondary | ICD-10-CM | POA: Insufficient documentation

## 2017-01-04 DIAGNOSIS — R05 Cough: Secondary | ICD-10-CM

## 2017-01-04 DIAGNOSIS — J988 Other specified respiratory disorders: Secondary | ICD-10-CM | POA: Diagnosis not present

## 2017-01-04 HISTORY — DX: Other specified disorders of bone density and structure, unspecified site: M85.80

## 2017-01-04 MED ORDER — DOXYCYCLINE HYCLATE 100 MG PO CAPS: 100.0000 mg | ORAL_CAPSULE | Freq: Two times a day (BID) | ORAL | 0 refills | Status: DC

## 2017-01-04 NOTE — Patient Instructions (Signed)
BEFORE YOU LEAVE: -xray sheet -follow up: 1) reschedule physical to any time that is good for her in next 2 months  Go get the chest xray  Start the doxycycline (antibiotic)  Seek care if worsening, new concerns or symptoms persist despite treatment.

## 2017-01-04 NOTE — Progress Notes (Signed)
HPI:  Here for physical but prefers to change to sick visit as is not feeling well: -started: 10 days ago -symptoms:nasal congestion, sore throat, cough - now worsening with thick yellow mucus, sinus pain, upper tooth pain, bilat lowe rrib pain with coughing and deep breathing, wheezy feeling at times -denies:fever, SOB, NVD -has tried: tessalon, nyquil, delsum -sick contacts/travel/risks: no reported flu, strep or tick exposure  ROS: See pertinent positives and negatives per HPI.  Past Medical History:  Diagnosis Date  . HTN (hypertension)   . Hypercholesteremia   . Melanoma of skin ([place]) 08/25/2015   sees dermatologist at least twice per year, Dr. Denna Haggard  . Osteoarthrosis, unspecified whether generalized or localized, involving lower leg 11/19/2014   of knees, s/p knee replacements bilat, sees [facility]  . Palpitations 08/25/2015  . Vaginal atrophy 08/25/2015    Past Surgical History:  Procedure Laterality Date  . APPENDECTOMY  2009  . CESAREAN SECTION    . KNEE SURGERY  2009   left  . WRIST SURGERY  2007   right    Family History  Problem Relation Age of Onset  . Heart disease      Social History   Social History  . Marital status: Married    Spouse name: N/A  . Number of children: 1  . Years of education: N/A   Occupational History  . Tree surgeon    Social History Main Topics  . Smoking status: Never Smoker  . Smokeless tobacco: Never Used  . Alcohol use 1.8 oz/week    3 Glasses of wine per week  . Drug use: Unknown  . Sexual activity: Not Asked   Other Topics Concern  . 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  benzonatate (TESSALON) 100 MG capsule, Take 1 capsule (100 mg total) by mouth 2 (two) times daily as needed for cough., Disp: 20  capsule, Rfl: 0 .  BYSTOLIC 5 MG tablet, TAKE 1 TABLET (5 MG TOTAL) BY MOUTH DAILY., Disp: 30 tablet, Rfl: 0 .  Magnesium 500 MG TABS, Take by mouth daily., Disp: , Rfl:  .  PREMARIN vaginal cream, INSERT 1 APPLICATORFUL VAGINALLY EVERY 30 (THIRTY) DAYS., Disp: 30 g, Rfl: 1 .  doxycycline (VIBRAMYCIN) 100 MG capsule, Take 1 capsule (100 mg total) by mouth 2 (two) times daily., Disp: 20 capsule, Rfl: 0  EXAM:  Vitals:   01/04/17 1130  BP: 122/80  Pulse: (!) 52  Temp: 97.9 F (36.6 C)    Body mass index is 24.67 kg/m.  GENERAL: vitals reviewed and listed above, alert, oriented, appears well hydrated and in no acute distress  HEENT: atraumatic, conjunttiva clear, no obvious abnormalities on inspection of external nose and ears, normal appearance of ear canals and TMs, thick nasal congestion, mild post oropharyngeal erythema with PND, no tonsillar edema or exudate, no sinus TTP  NECK: no obvious masses on inspection  LUNGS: clear to auscultation bilaterally, no wheezes, rales or rhonchi, good air movement  CV: HRRR, no peripheral edema  MS: moves all extremities without noticeable abnormality  PSYCH: pleasant and cooperative, no obvious depression or anxiety  ASSESSMENT AND PLAN:  Discussed the following assessment and plan:  Cough -  Plan: DG Chest 2 View  Respiratory infection  We discussed potential etiologies, with VURI and now with 2ndary bacterial sinusitis and/or bronchitis being most likely. We discussed treatment side effects, likely course, return precuations and signs of developing a serious illness. Opted for CXR to eval for LRI. Doxy should cover this as well - may add prednisone if bronchitis or wheezing continues - no wheezing on exam. -she plans to reschedule physical. -of course, we advised to return or notify a doctor immediately if symptoms worsen or persist or new concerns arise.    Patient Instructions  BEFORE YOU LEAVE: -xray sheet -follow up: 1)  reschedule physical to any time that is good for her in next 2 months  Go get the chest xray  Start the doxycycline (antibiotic)  Seek care if worsening, new concerns or symptoms persist despite treatment.    Colin Benton R., DO

## 2017-01-04 NOTE — Progress Notes (Signed)
Pre visit review using our clinic review tool, if applicable. No additional management support is needed unless otherwise documented below in the visit note. 

## 2017-01-10 ENCOUNTER — Other Ambulatory Visit: Payer: Self-pay | Admitting: *Deleted

## 2017-01-10 DIAGNOSIS — M8580 Other specified disorders of bone density and structure, unspecified site: Secondary | ICD-10-CM

## 2017-01-13 ENCOUNTER — Other Ambulatory Visit: Payer: Self-pay | Admitting: Family Medicine

## 2017-03-06 NOTE — Progress Notes (Deleted)
HPI:  Here for CPE:  -Concerns and/or follow up today: last CPE 08/2015 HTN/Palpitations/Hx mild HLD: -chronic -meds: bystolic, used to see cardiologist with remote eval with normal echo and stress test per her report  Vaginal Atrophy: -uses premarin 1x per month  Hx Melanoma skin ca: -sees dermatologist at least 2x per year for this  OA knees and back: -sees Dr. Arvella Nigh at [facility] per her report -s/p bilat knee replacement  -Diet: variety of foods, balance and well rounded, larger portion sizes  -Exercise: no regular exercise  -Taking folic acid, vitamin D or calcium: no  -Diabetes and Dyslipidemia Screening:  -Hx of HTN: no  -Vaccines: UTD  -pap history:  -FDLMP:  -sexual activity: yes, female partner, no new partners  -wants STI testing (Hep C if born [birth year]-65): no  -FH breast, colon or ovarian ca: see FH Last mammogram: Last colon cancer screening:  Breast Ca Risk Assessment: -[email or web address].it  Genetic Counseling Screen: Http://www.breastcancergenesscreen.org/startScreen.aspx  FRAX (50-65):  DEXA (>/= 65):   -Alcohol, Tobacco, drug use: see social history  Review of Systems - no fevers, unintentional weight loss, vision loss, hearing loss, chest pain, sob, hemoptysis, melena, hematochezia, hematuria, genital discharge, changing or concerning skin lesions, bleeding, bruising, loc, thoughts of self harm or SI  Past Medical History:  Diagnosis Date  . HTN (hypertension)   . Hypercholesteremia   . Melanoma of skin ([place]) 08/25/2015   sees dermatologist at least twice per year, Dr. Denna Haggard  . Osteoarthrosis, unspecified whether generalized or localized, involving lower leg 11/19/2014   of knees, s/p knee replacements bilat, sees [facility]  . Palpitations 08/25/2015  . Vaginal atrophy 08/25/2015    Past Surgical History:  Procedure Laterality Date  . APPENDECTOMY  2009  . CESAREAN SECTION    . KNEE SURGERY  2009   left  . WRIST  SURGERY  2007   right    Family History  Problem Relation Age of Onset  . Heart disease      Social History   Social History  . Marital status: Married    Spouse name: N/A  . Number of children: 1  . Years of education: N/A   Occupational History  . Tree surgeon    Social History Main Topics  . Smoking status: Never Smoker  . Smokeless tobacco: Never Used  . Alcohol use 1.8 oz/week    3 Glasses of wine per week  . Drug use: Unknown  . Sexual activity: Not on file   Other Topics Concern  . 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  benzonatate (TESSALON) 100 MG capsule, Take 1 capsule (100 mg total) by mouth 2 (two) times daily as needed for cough., Disp: 20 capsule, Rfl: 0 .  BYSTOLIC 5 MG tablet, TAKE 1 TABLET BY MOUTH DAILY, Disp: 30 tablet, Rfl: 5 .  doxycycline (VIBRAMYCIN) 100 MG capsule, Take 1 capsule (100 mg total) by mouth 2 (two) times daily., Disp: 20 capsule, Rfl: 0 .  Magnesium 500 MG TABS, Take by mouth daily., Disp: , Rfl:  .  PREMARIN vaginal cream, INSERT 1 APPLICATORFUL VAGINALLY EVERY 30 (THIRTY) DAYS., Disp: 30 g, Rfl: 1  EXAM:  There were no  vitals filed for this visit.  GENERAL: vitals reviewed and listed below, alert, oriented, appears well hydrated and in no acute distress  HEENT: head atraumatic, PERRLA, normal appearance of eyes, ears, nose and mouth. moist mucus membranes.  NECK: supple, no masses or lymphadenopathy  LUNGS: clear to auscultation bilaterally, no rales, rhonchi or wheeze  CV: HRRR, no peripheral edema or cyanosis, normal pedal pulses  ABDOMEN: bowel sounds normal, soft, non tender to palpation, no masses, no rebound or guarding  SKIN: no rash or abnormal lesions  MS: normal gait, moves all extremities  normally  NEURO: normal gait, speech and thought processing grossly intact, muscle tone grossly intact throughout  PSYCH: normal affect, pleasant and cooperative  ASSESSMENT AND PLAN:  Discussed the following assessment and plan:  There are no diagnoses linked to this encounter.  -Discussed and advised all [place] preventive services health task force level A and B recommendations for age, sex and risks.  -Advised at least 150 minutes of exercise per week and a healthy diet with avoidance of (less then 1 serving per week) processed foods, white starches, red meat, fast foods and sweets and consisting of: * 5-9 servings of fresh fruits and vegetables (not corn or potatoes) *nuts and seeds, beans *olives and olive oil *lean meats such as fish and white chicken  *whole grains  -labs, studies and vaccines per orders this encounter  No orders of the defined types were placed in this encounter.   Patient advised to return to clinic immediately if symptoms worsen or persist or new concerns.  There are no Patient Instructions on file for this visit.  No Follow-up on file.  Colin Benton R., DO

## 2017-03-07 ENCOUNTER — Other Ambulatory Visit: Payer: Self-pay | Admitting: Physician Assistant

## 2017-03-07 ENCOUNTER — Encounter: Payer: BLUE CROSS/BLUE SHIELD | Admitting: Family Medicine

## 2017-03-07 DIAGNOSIS — C4491 Basal cell carcinoma of skin, unspecified: Secondary | ICD-10-CM

## 2017-03-07 HISTORY — DX: Basal cell carcinoma of skin, unspecified: C44.91

## 2017-03-07 LAB — HM DEXA SCAN

## 2017-03-07 LAB — HM MAMMOGRAPHY

## 2017-03-12 ENCOUNTER — Encounter: Payer: Self-pay | Admitting: Family Medicine

## 2017-03-12 DIAGNOSIS — M810 Age-related osteoporosis without current pathological fracture: Secondary | ICD-10-CM

## 2017-03-15 ENCOUNTER — Telehealth: Payer: Self-pay | Admitting: Family Medicine

## 2017-03-15 NOTE — Telephone Encounter (Signed)
Received results of bone density.  Now has osteoporosis.  Updated in chart.  Papers given to Dr. Maudie Mercury.  Message left for me that stated to advise the pt to take VitD3 1000 in daily.  Take calcium 1200 mg daily.  If she is willing to consider medication than come in for an appointment to discuss.  Will call patient.

## 2017-03-19 ENCOUNTER — Encounter: Payer: Self-pay | Admitting: Family Medicine

## 2017-03-20 NOTE — Telephone Encounter (Signed)
Spoke to the pt and informed her of results.  She reports that she has been taking Vitamin D 500 units daily.  Advised her to increase to 1000 units daily.  Also advise that she start calcium 1200 mg daily.  Pt has an appt on 03/21/17 and will discuss medication at that time.  Vitamin D and calcium added to the medication list.

## 2017-03-20 NOTE — Progress Notes (Signed)
HPI:  Here for CPE: Rescheduled when I was sick. Sees dermatologist for skin checks  -Concerns and/or follow up today:  VURI illness last week, now much better.  Osteoporosis, mild comp rx: -dexa 03/2017 with osteoporosis -she does not want to take medications -did start Vit D3 1000 and is trying to get more consistent wt bearing exercise -agrees to monitoring and considering medication if not improving  HTN/Palpitations/Hx mild HLD: -chronic -meds: bystolic, used to see cardiologist with remote eval with normal echo and stress test per her report  Vaginal Atrophy: -uses premarin 1x per month  Hx Melanoma skin ca: -sees dermatologist at least 2x per year for this  OA knees and back: -sees [facility]. [facility]ella [facility] at [facility] per her report -s/p bilat knee replacement  -Diabetes and Dyslipidemia Screening: fasting for labs  -Hx of HTN: no  -Vaccines: UTD  -pap history: 10/2014 per her report and normal - wishes to do today  -FDLMP: n/a  -sexual activity: yes, female partner, no new partners  -wants STI testing (Hep C if born 30-65): no  -FH breast, colon or ovarian ca: see FH Last mammogram: done 2018 Last colon cancer screening: due in December per pt  -Alcohol, Tobacco, drug use: see social history  Review of Systems - no fevers, unintentional weight loss, vision loss, hearing loss, chest pain, sob, hemoptysis, melena, hematochezia, hematuria, genital discharge, changing or concerning skin lesions, bleeding, bruising, loc, thoughts of self harm or SI  Past Medical History:  Diagnosis Date  . HTN (hypertension)   . Hypercholesteremia   . Melanoma of skin ([place]) 08/25/2015   sees dermatologist at least twice per year, Dr. Denna Haggard  . Osteoarthrosis, unspecified whether generalized or localized, involving lower leg 11/19/2014   of knees, s/p knee replacements bilat, sees [facility]  . Palpitations 08/25/2015  . Vaginal atrophy 08/25/2015    Past Surgical History:   Procedure Laterality Date  . APPENDECTOMY  2009  . CESAREAN SECTION    . KNEE SURGERY  2009   left  . WRIST SURGERY  2007   right    Family History  Problem Relation Age of Onset  . Heart disease      Social History   Social History  . Marital status: Married    Spouse name: N/A  . Number of children: 1  . Years of education: N/A   Occupational History  . Tree surgeon    Social History Main Topics  . Smoking status: Never Smoker  . Smokeless tobacco: Never Used  . Alcohol use 1.8 oz/week    3 Glasses of wine per week  . Drug use: Unknown  . Sexual activity: Not Asked   Other Topics Concern  . 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  BYSTOLIC 5 MG tablet, TAKE 1 TABLET BY MOUTH DAILY, Disp: 30 tablet, Rfl: 5 .  CALCIUM PO, Take 1,200 mg by mouth daily., Disp: , Rfl:  .  Cholecalciferol (VITAMIN D3 PO), Take 1,000 Units by mouth daily., Disp: , Rfl:  .  Magnesium 500 MG TABS, Take by mouth daily., Disp: , Rfl:  .  PREMARIN vaginal cream, INSERT 1 APPLICATORFUL VAGINALLY EVERY 30 (THIRTY) DAYS., Disp: 30 g,  Rfl: 1  EXAM:  Vitals:   03/21/17 0727  BP: 120/82  Pulse: 62  Temp: 98.2 F (36.8 C)    GENERAL: vitals reviewed and listed below, alert, oriented, appears well hydrated and in no acute distress  HEENT: head atraumatic, PERRLA, normal appearance of eyes, ears, nose and mouth. moist mucus membranes.  NECK: supple, no masses or lymphadenopathy  LUNGS: clear to auscultation bilaterally, no rales, rhonchi or wheeze  CV: HRRR, no peripheral edema or cyanosis, normal pedal pulses  BREAST: normal appearance - no skin lesions or discharge noted on inspection of both breasts, on palpation of both breast and axillary region no suspicious lesions  appreciated today  GU: normal appearance of external genitalia - no lesions or masses appreciated, normal appearing vaginal mucosa - no abnormal discharge, normal appearance of cervix - no lesions or abnormal discharge observed  RECTAL: deferred  ABDOMEN: bowel sounds normal, soft, non tender to palpation, no masses, no rebound or guarding  SKIN: no rash or abnormal lesions  MS: normal gait, moves all extremities normally  NEURO: normal gait, speech and thought processing grossly intact, muscle tone grossly intact throughout  PSYCH: normal affect, pleasant and cooperative  ASSESSMENT AND PLAN:  Discussed the following assessment and plan:  Encounter for preventive health examination - Plan: Hemoglobin A1c  Essential hypertension - Plan: Basic metabolic panel, CBC  Pure hypercholesterolemia - Plan: Lipid panel  Melanoma of skin (HCC)  Osteoporosis, unspecified osteoporosis type, unspecified pathological fracture presence  -discussed dexa and tx/eval options. She prefers to do vit d, adequate cal and exercise. Advised recheck in 2 years. She declined medications at this time.  -Discussed and advised all [place] preventive services health task force level A and B recommendations for age, sex and risks.  -Advised at least 150 minutes of exercise per week and a healthy diet with avoidance of (less then 1 serving per week) processed foods, white starches, red meat, fast foods and sweets and consisting of: * 5-9 servings of fresh fruits and vegetables (not corn or potatoes) *nuts and seeds, beans *olives and olive oil *lean meats such as fish and white chicken  *whole grains  -pap today  -fasting labs, studies and vaccines per orders this encounter  Orders Placed This Encounter  Procedures  . Lipid panel  . Hemoglobin A1c  . Basic metabolic panel  . CBC    Patient advised to return to clinic immediately if symptoms worsen or persist or new concerns.  Patient Instructions   BEFORE YOU LEAVE: -follow up: 3-4 months -labs  Vit D3 1000 IU daily. Adequate dietary calcium - 1200mg  daily. Wt bearing exercise.  We have ordered labs and a pap smear. It can take up to 1-2 weeks for results and processing. IF results require follow up or explanation, we will call you with instructions. Clinically stable results will be released to your [facility]. If you have not heard from [place] or cannot find your results in [facility] in 2 weeks please contact our office at [phone].  If you are not yet signed up for [facility], please consider signing up.   We recommend the following healthy lifestyle for LIFE: 1) Small portions.   Tip: eat off of a salad plate instead of a dinner plate.  Tip: if you need more or a snack choose fruits, veggies and/or a handful of nuts or seeds.  2) Eat a healthy clean diet.  * Tip: Avoid (less then 1 serving per week): processed foods, sweets, sweetened drinks, white  starches (rice, flour, bread, potatoes, pasta, etc), red meat, fast foods, butter  *Tip: CHOOSE instead   * 5-9 servings per day of fresh or frozen fruits and vegetables (but not corn, potatoes, bananas, canned or dried fruit)   *nuts and seeds, beans   *olives and olive oil   *small portions of lean meats such as fish and white chicken    *small portions of whole grains  3)Get at least 150 minutes of sweaty aerobic exercise per week.  4)Reduce stress - consider counseling, meditation and relaxation to balance other aspects of your life.  WE NOW OFFER   [facility] FAST TRACK!!!  SAME DAY Appointments for ACUTE CARE  Such as: Sprains, Injuries, cuts, abrasions, rashes, muscle pain, joint pain, back pain Colds, flu, sore throats, headache, allergies, cough, fever  Ear pain, sinus and eye infections Abdominal pain, nausea, vomiting, diarrhea, upset stomach Animal/insect bites  3 Easy Ways to Schedule: Walk-In Scheduling Call in scheduling Mychart Sign-up:  [email or web address]                  No Follow-up on file.  Colin Benton R., DO

## 2017-03-21 ENCOUNTER — Other Ambulatory Visit (HOSPITAL_COMMUNITY)
Admission: RE | Admit: 2017-03-21 | Discharge: 2017-03-21 | Disposition: A | Discharge status: Home / Self Care | Payer: BLUE CROSS/BLUE SHIELD | Source: Ambulatory Visit | Attending: Family Medicine | Admitting: Family Medicine

## 2017-03-21 ENCOUNTER — Ambulatory Visit (INDEPENDENT_AMBULATORY_CARE_PROVIDER_SITE_OTHER): Payer: BLUE CROSS/BLUE SHIELD | Admitting: Family Medicine

## 2017-03-21 ENCOUNTER — Encounter: Payer: Self-pay | Admitting: Family Medicine

## 2017-03-21 VITALS — BP 120/82 | HR 62 | Temp 98.2°F | Ht 65.0 in | Wt 147.6 lb

## 2017-03-21 DIAGNOSIS — C439 Malignant melanoma of skin, unspecified: Secondary | ICD-10-CM | POA: Diagnosis not present

## 2017-03-21 DIAGNOSIS — I10 Essential (primary) hypertension: Secondary | ICD-10-CM

## 2017-03-21 DIAGNOSIS — Z0000 Encounter for general adult medical examination without abnormal findings: Secondary | ICD-10-CM | POA: Diagnosis not present

## 2017-03-21 DIAGNOSIS — E7800 Pure hypercholesterolemia, unspecified: Secondary | ICD-10-CM | POA: Diagnosis not present

## 2017-03-21 DIAGNOSIS — M810 Age-related osteoporosis without current pathological fracture: Secondary | ICD-10-CM

## 2017-03-21 DIAGNOSIS — R7989 Other specified abnormal findings of blood chemistry: Secondary | ICD-10-CM

## 2017-03-21 DIAGNOSIS — Z124 Encounter for screening for malignant neoplasm of cervix: Secondary | ICD-10-CM | POA: Insufficient documentation

## 2017-03-21 LAB — LIPID PANEL
Cholesterol: 225 mg/dL — ABNORMAL HIGH (ref 0–200)
HDL: 77.4 mg/dL (ref >39.00)
LDL Cholesterol: 122 mg/dL — ABNORMAL HIGH (ref 0–99)
NonHDL: 147.22
Total CHOL/HDL Ratio: 3
Triglycerides: 125 mg/dL (ref 0.0–149.0)
VLDL: 25 mg/dL (ref 0.0–40.0)

## 2017-03-21 LAB — CBC
HCT: 43 % (ref 36.0–46.0)
Hemoglobin: 14.4 g/dL (ref 12.0–15.0)
MCHC: 33.4 g/dL (ref 30.0–36.0)
MCV: 100.6 fl — AB (ref 78.0–100.0)
PLATELETS: 201 10*3/uL (ref 150.0–400.0)
RBC: 4.27 Mil/uL (ref 3.87–5.11)
RDW: 13.8 % (ref 11.5–15.5)
WBC: 3 10*3/uL — ABNORMAL LOW (ref 4.0–10.5)

## 2017-03-21 LAB — BASIC METABOLIC PANEL
BUN: 13 mg/dL (ref 6–23)
CALCIUM: 9.7 mg/dL (ref 8.4–10.5)
CHLORIDE: 102 meq/L (ref 96–112)
CO2: 29 meq/L (ref 19–32)
CREATININE: 0.81 mg/dL (ref 0.40–1.20)
GFR: 75.93 mL/min (ref >60.00)
Glucose, Bld: 96 mg/dL (ref 70–99)
Potassium: 4.2 mEq/L (ref 3.5–5.1)
Sodium: 139 mEq/L (ref 135–145)

## 2017-03-21 LAB — HEMOGLOBIN A1C: HEMOGLOBIN A1C: 5.2 % (ref 4.6–6.5)

## 2017-03-21 MED ORDER — ESTROGENS, CONJUGATED 0.625 MG/GM VA CREA: TOPICAL_CREAM | VAGINAL | 11 refills | Status: DC

## 2017-03-21 MED ORDER — NEBIVOLOL HCL 5 MG PO TABS: 5.0000 mg | ORAL_TABLET | Freq: Every day | ORAL | 11 refills | Status: DC

## 2017-03-21 NOTE — Patient Instructions (Signed)
BEFORE YOU LEAVE: -follow up: 3-4 months -labs  Vit D3 1000 IU daily. Adequate dietary calcium - 1200mg  daily. Wt bearing exercise.  We have ordered labs and a pap smear. It can take up to 1-2 weeks for results and processing. IF results require follow up or explanation, we will call you with instructions. Clinically stable results will be released to your [facility]. If you have not heard from [place] or cannot find your results in [facility] in 2 weeks please contact our office at [phone].  If you are not yet signed up for [facility], please consider signing up.   We recommend the following healthy lifestyle for LIFE: 1) Small portions.   Tip: eat off of a salad plate instead of a dinner plate.  Tip: if you need more or a snack choose fruits, veggies and/or a handful of nuts or seeds.  2) Eat a healthy clean diet.  * Tip: Avoid (less then 1 serving per week): processed foods, sweets, sweetened drinks, white starches (rice, flour, bread, potatoes, pasta, etc), red meat, fast foods, butter  *Tip: CHOOSE instead   * 5-9 servings per day of fresh or frozen fruits and vegetables (but not corn, potatoes, bananas, canned or dried fruit)   *nuts and seeds, beans   *olives and olive oil   *small portions of lean meats such as fish and white chicken    *small portions of whole grains  3)Get at least 150 minutes of sweaty aerobic exercise per week.  4)Reduce stress - consider counseling, meditation and relaxation to balance other aspects of your life.  WE NOW OFFER   [facility] FAST TRACK!!!  SAME DAY Appointments for ACUTE CARE  Such as: Sprains, Injuries, cuts, abrasions, rashes, muscle pain, joint pain, back pain Colds, flu, sore throats, headache, allergies, cough, fever  Ear pain, sinus and eye infections Abdominal pain, nausea, vomiting, diarrhea, upset stomach Animal/insect bites  3 Easy Ways to Schedule: Walk-In Scheduling Call in scheduling Mychart Sign-up:  [email or web address]

## 2017-03-21 NOTE — Progress Notes (Signed)
Pre visit review using our clinic review tool, if applicable. No additional management support is needed unless otherwise documented below in the visit note. 

## 2017-03-21 NOTE — Addendum Note (Signed)
Addended by: Agnes Lawrence on: 03/21/2017 08:13 AM   Modules accepted: Orders

## 2017-03-22 ENCOUNTER — Ambulatory Visit (INDEPENDENT_AMBULATORY_CARE_PROVIDER_SITE_OTHER): Payer: BLUE CROSS/BLUE SHIELD | Admitting: Family Medicine

## 2017-03-22 ENCOUNTER — Encounter: Payer: Self-pay | Admitting: Family Medicine

## 2017-03-22 VITALS — BP 120/80 | HR 89 | Temp 98.1°F | Ht 65.0 in | Wt 150.5 lb

## 2017-03-22 DIAGNOSIS — R059 Cough, unspecified: Secondary | ICD-10-CM

## 2017-03-22 DIAGNOSIS — R05 Cough: Secondary | ICD-10-CM | POA: Diagnosis not present

## 2017-03-22 LAB — CYTOLOGY - PAP
Adequacy: ABSENT
Diagnosis: NEGATIVE
HPV: NOT DETECTED

## 2017-03-22 MED ORDER — BENZONATATE 100 MG PO CAPS: 100.0000 mg | ORAL_CAPSULE | Freq: Two times a day (BID) | ORAL | 0 refills | Status: DC

## 2017-03-22 MED ORDER — PREDNISONE 10 MG PO TABS: ORAL_TABLET | ORAL | 0 refills | Status: DC

## 2017-03-22 NOTE — Patient Instructions (Signed)
BEFORE YOU LEAVE: -xray sheet  Start the prednisone and the cough medication (tessalon) as needed.  Get the CXR and let us know if not improving over the next few days.  I hope you are feeling better soon! Seek care promptly if worsening or new concerns.

## 2017-03-22 NOTE — Progress Notes (Signed)
Pre visit review using our clinic review tool, if applicable. No additional management support is needed unless otherwise documented below in the visit note. 

## 2017-03-22 NOTE — Progress Notes (Signed)
HPI:  Acute visit for cough and congestion: -started: 1 week ago with low grade fever, congestion, cough - fevers resolved, persistent fatigue and cough - clear sputum, clear nasal congestion, intermittent SOB -denies:persistent fever, body aches, NVD, tooth pain -has tried: nothing -sick contacts/travel/risks: no reported flu, strep or tick exposure  ROS: See pertinent positives and negatives per HPI.  Past Medical History:  Diagnosis Date  . HTN (hypertension)   . Hypercholesteremia   . Melanoma of skin ([place]) 08/25/2015   sees dermatologist at least twice per year, Dr. Denna Haggard  . Osteoarthrosis, unspecified whether generalized or localized, involving lower leg 11/19/2014   of knees, s/p knee replacements bilat, sees [facility]  . Palpitations 08/25/2015  . Vaginal atrophy 08/25/2015    Past Surgical History:  Procedure Laterality Date  . APPENDECTOMY  2009  . CESAREAN SECTION    . KNEE SURGERY  2009   left  . WRIST SURGERY  2007   right    Family History  Problem Relation Age of Onset  . Heart disease      Social History   Social History  . Marital status: Married    Spouse name: N/A  . Number of children: 1  . Years of education: N/A   Occupational History  . Tree surgeon    Social History Main Topics  . Smoking status: Never Smoker  . Smokeless tobacco: Never Used  . Alcohol use 1.8 oz/week    3 Glasses of wine per week  . Drug use: Unknown  . Sexual activity: Not Asked   Other Topics Concern  . 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  CALCIUM PO, Take 1,200 mg by mouth daily., Disp: , Rfl:  .  Cholecalciferol (VITAMIN D3 PO), Take 1,000 Units by mouth daily., Disp: , Rfl:  .  conjugated estrogens (PREMARIN) vaginal cream, INSERT 1  APPLICATORFUL VAGINALLY EVERY 30 (THIRTY) DAYS., Disp: 30 g, Rfl: 11 .  Magnesium 500 MG TABS, Take by mouth daily., Disp: , Rfl:  .  nebivolol (BYSTOLIC) 5 MG tablet, Take 1 tablet (5 mg total) by mouth daily., Disp: 30 tablet, Rfl: 11 .  benzonatate (TESSALON) 100 MG capsule, Take 1 capsule (100 mg total) by mouth 2 (two) times daily., Disp: 20 capsule, Rfl: 0 .  predniSONE (DELTASONE) 10 MG tablet, 40mg  (4 tabs) x 2 days, then 20mg  (2 tabs) x 2 days, then 10mg  (1 tab) x 2 days, Disp: 14 tablet, Rfl: 0  EXAM:  Vitals:   03/22/17 1318  BP: 120/80  Pulse: 89  Temp: 98.1 F (36.7 C)    Body mass index is 25.04 kg/m.  GENERAL: vitals reviewed and listed above, alert, oriented, appears well hydrated and in no acute distress  HEENT: atraumatic, conjunttiva clear, no obvious abnormalities on inspection of external nose and ears, normal appearance of ear canals and TMs, clear nasal congestion, mild post oropharyngeal erythema with PND, no tonsillar edema or exudate, no sinus TTP  NECK: no obvious masses on inspection  LUNGS: clear to auscultation bilaterally, no wheezes, rales or rhonchi, good air movement  CV: HRRR, no peripheral edema  MS: moves all extremities without noticeable abnormality  PSYCH: pleasant and  cooperative, no obvious depression or anxiety  ASSESSMENT AND PLAN:  Discussed the following assessment and plan:  Cough - Plan: DG Chest 2 View  -given HPI and exam findings today, a serious infection or illness is unlikely. We discussed potential etiologies, with VURI being most likely, and possible bronchitis/RAD vs other. Opted for prednisone, cough medication and CXR if not improving. We discussed treatment side effects, likely course, antibiotic misuse, transmission, and signs of developing a serious illness. -of course, we advised to return or notify a doctor immediately if symptoms worsen or persist or new concerns arise.    Patient Instructions  BEFORE YOU  LEAVE: -xray sheet  Start the prednisone and the cough medication (tessalon) as needed.  Get the CXR and let us know if not improving over the next few days.  I hope you are feeling better soon! Seek care promptly if worsening or new concerns.      Colin Benton R., DO

## 2017-03-22 NOTE — Addendum Note (Signed)
Addended by: Agnes Lawrence on: 03/22/2017 12:01 PM   Modules accepted: Orders

## 2017-05-01 ENCOUNTER — Other Ambulatory Visit: Payer: BLUE CROSS/BLUE SHIELD

## 2017-07-08 ENCOUNTER — Other Ambulatory Visit: Payer: Self-pay | Admitting: Family Medicine

## 2017-08-22 ENCOUNTER — Encounter: Payer: Self-pay | Admitting: Family Medicine

## 2017-09-16 DIAGNOSIS — Z23 Encounter for immunization: Secondary | ICD-10-CM | POA: Diagnosis not present

## 2017-11-13 ENCOUNTER — Other Ambulatory Visit: Payer: Self-pay | Admitting: Physician Assistant

## 2017-11-13 DIAGNOSIS — C4492 Squamous cell carcinoma of skin, unspecified: Secondary | ICD-10-CM

## 2017-11-13 HISTORY — DX: Squamous cell carcinoma of skin, unspecified: C44.92

## 2017-12-12 ENCOUNTER — Encounter: Payer: Self-pay | Admitting: Family Medicine

## 2017-12-30 ENCOUNTER — Other Ambulatory Visit: Payer: Self-pay | Admitting: Family Medicine

## 2018-02-17 NOTE — Progress Notes (Signed)
HPI:  Using dictation device. Unfortunately this device frequently misinterprets words/phrases.  Elizabeth Armstrong is a pleasant 62 y.o. here for follow up and acute issue. Chronic medical problems summarized below were reviewed for changes.  She has not been in some time, and she has her physical set up next month.  Her main concern is a rash on her breast, that has been going on for a few weeks.  It is itchy.  She has tried hydrocortisone cream on it.  She also has noticed that her blood pressure has been running higher than usual in the 170s-180s over 90s.  She occasionally feels mild palpitations.  She is on Bystolic for this.  Her insurance is telling her she may need to change his medication, but for now she wishes to continue it.  Initially she prefers to only treat her blood pressure with lifestyle changes and no changes to her medicines.  However she then agrees to add a medication after discussion of risk and benefits and also work on lifestyle changes.  She has had increased stress, poor diet and poor activity level.  She feels she is doing better now and can get back on track with a healthier lifestyle.  Denies HA, vision changes, CP, SOB, DOE, treatment intolerance or new symptoms. Due for labs and colon ca screening, CPE april  Osteoporosis, mild comp rx: -dexa 03/2017 with osteoporosis -she does not want to take medications -tx:Vit D3 1000 and is trying to get more consistent wt bearing exercise  HTN/Palpitations/Hx mild HLD: -chronic -meds: bystolic, used to see cardiologist with remote eval with normal echo and stress test per her report  Vaginal Atrophy: -uses premarin 1x per month  Hx Melanoma skin ca: -sees dermatologist at least 2x per year for this  OA knees and back: -se[facility]. [facility]ella [facility] per her report -s/p bilat knee replacement  ROS: See pertinent positives and negatives per HPI.  Past Medical History:  Diagnosis Date  . HTN (hypertension)   .  Hypercholesteremia   . Melanoma of skin ([place]) 08/25/2015   sees dermatologist at least twice per year, Dr. Denna Haggard  . Osteoarthrosis, unspecified whether generalized or localized, involving lower leg 11/19/2014   of knees, s/p knee replacements bilat, sees [facility]  . Palpitations 08/25/2015  . Vaginal atrophy 08/25/2015    Past Surgical History:  Procedure Laterality Date  . APPENDECTOMY  2009  . CESAREAN SECTION    . KNEE SURGERY  2009   left  . WRIST SURGERY  2007   right    Family History  Problem Relation Age of Onset  . Heart disease Unknown     SOCIAL HX: See HPI   Current Outpatient Medications:  .  BYSTOLIC 5 MG tablet, TAKE 1 TABLET BY MOUTH EVERY DAY, Disp: 30 tablet, Rfl: 3 .  Cholecalciferol (VITAMIN D3 PO), Take 1,000 Units by mouth daily., Disp: , Rfl:  .  conjugated estrogens (PREMARIN) vaginal cream, INSERT 1 APPLICATORFUL VAGINALLY EVERY 30 (THIRTY) DAYS., Disp: 30 g, Rfl: 11 .  Magnesium 500 MG TABS, Take by mouth daily., Disp: , Rfl:  .  nebivolol (BYSTOLIC) 5 MG tablet, Take 1 tablet (5 mg total) by mouth daily., Disp: 30 tablet, Rfl: 11 .  hydrochlorothiazide (HYDRODIURIL) 25 MG tablet, Take 1 tablet (25 mg total) by mouth daily., Disp: 90 tablet, Rfl: 3  EXAM:  Vitals:   02/18/18 0817 02/18/18 0826  BP: (!) 140/102 (!) 150/90  Pulse: 61   Temp: 97.6 F (  36.4 C)   SpO2: 98%     Body mass index is 25.16 kg/m.  GENERAL: vitals reviewed and listed above, alert, oriented, appears well hydrated and in no acute distress  HEENT: atraumatic, conjunttiva clear, no obvious abnormalities on inspection of external nose and ears  NECK: no obvious masses on inspection  LUNGS: clear to auscultation bilaterally, no wheezes, rales or rhonchi, good air movement  CV: HRRR, no peripheral edema  MS: moves all extremities without noticeable abnormality  SKIN: Erythematous papular rash in the inframamillary creases bilaterally  PSYCH: pleasant and  cooperative, no obvious depression or anxiety  ASSESSMENT AND PLAN:  Discussed the following assessment and plan:  Essential hypertension - Plan: Basic metabolic panel Palpitations -discussed tx options -she wants to cont the bystolic at current dose -discussed other medication options and risks and she opted to try adding hctz, check bmp today -lifestyle recs discussed at length and advise stress management, healthy diet and regular aerobic ecuers -labs recheck and follow up at CPE in 1 month  Rash -looks like mild yeast rash -opted to try treatment with topical antifungal    -Patient advised to return or notify a doctor immediately if symptoms worsen or persist or new concerns arise.  Patient Instructions  BEFORE YOU LEAVE: -lab -follow up: AS SCHEDULED for physical in April  START the new blood pressure medication - hydrochlorothiazide and take every day, once daily in the morning  Continue the bystolic  Clotrimazole or lotrimin cream twice daily for the rash  We have ordered labs or studies at this visit. It can take up to 1-2 weeks for results and processing. IF results require follow up or explanation, we will call you with instructions. Clinically stable results will be released to your [facility]. If you have not heard from [place] or cannot find your results in [facility] in 2 weeks please contact our office at [phone].  If you are not yet signed up for [facility], please consider signing up.   We recommend the following healthy lifestyle for LIFE: 1) Small portions. But, make sure to get regular (at least 3 per day), healthy meals and small healthy snacks if needed.  2) Eat a healthy clean diet.   TRY TO EAT: -at least 5-7 servings of low sugar, colorful, and nutrient rich vegetables per day (not corn, potatoes or bananas.) -berries are the best choice if you wish to eat fruit (only eat small amounts if trying to reduce weight)  -lean meets (fish, white meat of chicken  or [place]) -vegan proteins for some meals - beans or tofu, whole grains, nuts and seeds -Replace bad fats with good fats - good fats include: fish, nuts and seeds, canola oil, olive oil -small amounts of low fat or non fat dairy -small amounts of100 % whole grains - check the lables -drink plenty of water  AVOID: -SUGAR, sweets, anything with added sugar, corn syrup or sweeteners - must read labels as even foods advertised as "healthy" often are loaded with sugar -if you must have a sweetener, small amounts of stevia may be best -sweetened beverages and artificially sweetened beverages -simple starches (rice, bread, potatoes, pasta, chips, etc - small amounts of 100% whole grains are ok) -red meat, pork, butter -fried foods, fast food, processed food, excessive dairy, eggs and coconut.  3)Get at least 150 minutes of sweaty aerobic exercise per week.  4)Reduce stress - consider counseling, meditation and relaxation to balance other aspects of your life.  Hannah R Kim, DO   

## 2018-02-18 ENCOUNTER — Ambulatory Visit (INDEPENDENT_AMBULATORY_CARE_PROVIDER_SITE_OTHER): Payer: BLUE CROSS/BLUE SHIELD | Admitting: Family Medicine

## 2018-02-18 ENCOUNTER — Encounter: Payer: Self-pay | Admitting: Family Medicine

## 2018-02-18 VITALS — BP 150/90 | HR 61 | Temp 97.6°F | Ht 65.0 in | Wt 151.2 lb

## 2018-02-18 DIAGNOSIS — R002 Palpitations: Secondary | ICD-10-CM | POA: Diagnosis not present

## 2018-02-18 DIAGNOSIS — R21 Rash and other nonspecific skin eruption: Secondary | ICD-10-CM | POA: Diagnosis not present

## 2018-02-18 DIAGNOSIS — I10 Essential (primary) hypertension: Secondary | ICD-10-CM | POA: Diagnosis not present

## 2018-02-18 LAB — BASIC METABOLIC PANEL
BUN: 12 mg/dL (ref 6–23)
CHLORIDE: 98 meq/L (ref 96–112)
CO2: 29 mEq/L (ref 19–32)
Calcium: 9.9 mg/dL (ref 8.4–10.5)
Creatinine, Ser: 0.72 mg/dL (ref 0.40–1.20)
GFR: 86.72 mL/min (ref >60.00)
Glucose, Bld: 120 mg/dL — ABNORMAL HIGH (ref 70–99)
POTASSIUM: 4.1 meq/L (ref 3.5–5.1)
SODIUM: 135 meq/L (ref 135–145)

## 2018-02-18 MED ORDER — HYDROCHLOROTHIAZIDE 25 MG PO TABS: 25.0000 mg | ORAL_TABLET | Freq: Every day | ORAL | 3 refills | Status: DC

## 2018-02-18 NOTE — Patient Instructions (Signed)
BEFORE YOU LEAVE: -lab -follow up: AS SCHEDULED for physical in April  START the new blood pressure medication - hydrochlorothiazide and take every day, once daily in the morning  Continue the bystolic  Clotrimazole or lotrimin cream twice daily for the rash  We have ordered labs or studies at this visit. It can take up to 1-2 weeks for results and processing. IF results require follow up or explanation, we will call you with instructions. Clinically stable results will be released to your [facility]. If you have not heard from [place] or cannot find your results in [facility] in 2 weeks please contact our office at [phone].  If you are not yet signed up for [facility], please consider signing up.   We recommend the following healthy lifestyle for LIFE: 1) Small portions. But, make sure to get regular (at least 3 per day), healthy meals and small healthy snacks if needed.  2) Eat a healthy clean diet.   TRY TO EAT: -at least 5-7 servings of low sugar, colorful, and nutrient rich vegetables per day (not corn, potatoes or bananas.) -berries are the best choice if you wish to eat fruit (only eat small amounts if trying to reduce weight)  -lean meets (fish, white meat of chicken or [place]) -vegan proteins for some meals - beans or tofu, whole grains, nuts and seeds -Replace bad fats with good fats - good fats include: fish, nuts and seeds, canola oil, olive oil -small amounts of low fat or non fat dairy -small amounts of100 % whole grains - check the lables -drink plenty of water  AVOID: -SUGAR, sweets, anything with added sugar, corn syrup or sweeteners - must read labels as even foods advertised as "healthy" often are loaded with sugar -if you must have a sweetener, small amounts of stevia may be best -sweetened beverages and artificially sweetened beverages -simple starches (rice, bread, potatoes, pasta, chips, etc - small amounts of 100% whole grains are ok) -red meat, pork, butter -fried  foods, fast food, processed food, excessive dairy, eggs and coconut.  3)Get at least 150 minutes of sweaty aerobic exercise per week.  4)Reduce stress - consider counseling, meditation and relaxation to balance other aspects of your life.

## 2018-03-11 ENCOUNTER — Ambulatory Visit (INDEPENDENT_AMBULATORY_CARE_PROVIDER_SITE_OTHER): Payer: BLUE CROSS/BLUE SHIELD | Admitting: Family Medicine

## 2018-03-11 ENCOUNTER — Encounter: Payer: Self-pay | Admitting: Family Medicine

## 2018-03-11 VITALS — BP 120/72 | HR 66 | Temp 98.3°F | Ht 64.0 in | Wt 155.3 lb

## 2018-03-11 DIAGNOSIS — Z1331 Encounter for screening for depression: Secondary | ICD-10-CM

## 2018-03-11 DIAGNOSIS — I10 Essential (primary) hypertension: Secondary | ICD-10-CM | POA: Diagnosis not present

## 2018-03-11 DIAGNOSIS — M810 Age-related osteoporosis without current pathological fracture: Secondary | ICD-10-CM | POA: Diagnosis not present

## 2018-03-11 DIAGNOSIS — Z0000 Encounter for general adult medical examination without abnormal findings: Secondary | ICD-10-CM | POA: Diagnosis not present

## 2018-03-11 DIAGNOSIS — E7800 Pure hypercholesterolemia, unspecified: Secondary | ICD-10-CM

## 2018-03-11 DIAGNOSIS — C439 Malignant melanoma of skin, unspecified: Secondary | ICD-10-CM | POA: Diagnosis not present

## 2018-03-11 DIAGNOSIS — R002 Palpitations: Secondary | ICD-10-CM

## 2018-03-11 NOTE — Patient Instructions (Signed)
BEFORE YOU LEAVE: -labs -follow up: 3-4 months  Talk with your dermatologist about pelvic exams for your history of melanoma.  We can do these, or we can have a gynecologist see you.  Get your colon cancer screening as planned.  We have ordered labs or studies at this visit. It can take up to 1-2 weeks for results and processing. IF results require follow up or explanation, we will call you with instructions. Clinically stable results will be released to your [facility]. If you have not heard from [place] or cannot find your results in [facility] in 2 weeks please contact our office at [phone].  If you are not yet signed up for [facility], please consider signing up.   Preventive Care 40-64 Years, Female Preventive care refers to lifestyle choices and visits with your health care provider that can promote health and wellness. What does preventive care include?  A yearly physical exam. This is also called an annual well check.  Dental exams once or twice a year.  Routine eye exams. Ask your health care provider how often you should have your eyes checked.  Personal lifestyle choices, including: ? Daily care of your teeth and gums. ? Regular physical activity. ? Eating a healthy diet. ? Avoiding tobacco and drug use. ? Limiting alcohol use. ? Practicing safe sex. ? Taking low-dose aspirin daily starting at age 44. ? Taking vitamin and mineral supplements as recommended by your health care provider. What happens during an annual well check? The services and screenings done by your health care provider during your annual well check will depend on your age, overall health, lifestyle risk factors, and family history of disease. Counseling Your health care provider may ask you questions about your:  Alcohol use.  Tobacco use.  Drug use.  Emotional well-being.  Home and relationship well-being.  Sexual activity.  Eating habits.  Work and work Statistician.  Method of birth  control.  Menstrual cycle.  Pregnancy history.  Screening You may have the following tests or measurements:  Height, weight, and BMI.  Blood pressure.  Lipid and cholesterol levels. These may be checked every 5 years, or more frequently if you are over 40 years old.  Skin check.  Lung cancer screening. You may have this screening every year starting at age 82 if you have a 30-pack-year history of smoking and currently smoke or have quit within the past 15 years.  Fecal occult blood test (FOBT) of the stool. You may have this test every year starting at age 86.  Flexible sigmoidoscopy or colonoscopy. You may have a sigmoidoscopy every 5 years or a colonoscopy every 10 years starting at age 24.  Hepatitis C blood test.  Hepatitis B blood test.  Sexually transmitted disease (STD) testing.  Diabetes screening. This is done by checking your blood sugar (glucose) after you have not eaten for a while (fasting). You may have this done every 1-3 years.  Mammogram. This may be done every 1-2 years. Talk to your health care provider about when you should start having regular mammograms. This may depend on whether you have a family history of breast cancer.  BRCA-related cancer screening. This may be done if you have a family history of breast, ovarian, tubal, or peritoneal cancers.  Pelvic exam and Pap test. This may be done every 3 years starting at age 64. Starting at age 96, this may be done every 5 years if you have a Pap test in combination with an HPV test.  Bone density scan. This is done to screen for osteoporosis. You may have this scan if you are at high risk for osteoporosis.  Discuss your test results, treatment options, and if necessary, the need for more tests with your health care provider. Vaccines Your health care provider may recommend certain vaccines, such as:  Influenza vaccine. This is recommended every year.  Tetanus, diphtheria, and acellular pertussis (Tdap,  Td) vaccine. You may need a Td booster every 10 years.  Varicella vaccine. You may need this if you have not been vaccinated.  Zoster vaccine. You may need this after age 65.  Measles, mumps, and rubella (MMR) vaccine. You may need at least one dose of MMR if you were born in 1957 or later. You may also need a second dose.  Pneumococcal 13-valent conjugate (PCV13) vaccine. You may need this if you have certain conditions and were not previously vaccinated.  Pneumococcal polysaccharide (PPSV23) vaccine. You may need one or two doses if you smoke cigarettes or if you have certain conditions.  Meningococcal vaccine. You may need this if you have certain conditions.  Hepatitis A vaccine. You may need this if you have certain conditions or if you travel or work in places where you may be exposed to hepatitis A.  Hepatitis B vaccine. You may need this if you have certain conditions or if you travel or work in places where you may be exposed to hepatitis B.  Haemophilus influenzae type b (Hib) vaccine. You may need this if you have certain conditions.  Talk to your health care provider about which screenings and vaccines you need and how often you need them. This information is not intended to replace advice given to you by your health care provider. Make sure you discuss any questions you have with your health care provider. Document Released: 12/16/2015 Document Revised: 08/08/2016 Document Reviewed: 09/20/2015 Elsevier Interactive Patient Education  [employer].

## 2018-03-11 NOTE — Progress Notes (Signed)
HPI:  Using dictation device. Unfortunately this device frequently misinterprets words/phrases.  Here for CPE: Due for ? Pelvic/breast for melanoma, colon ca screening?, HIV, labs, ROS  -Concerns and/or follow up today:  Chronic medical problems summarized below were reviewed for changes.  Reports she has been doing well for the most part.  She is getting some physical therapy for a posterior tibialis tendinitis.  The rash that she had has mostly cleared up, still mild, but she had been at the beach.  She plans to see her dermatologist if this does not resolve in the next few weeks.  She is getting at least 150 minutes of aerobic exercise per week and is eating a healthy diet per her report.  She sees [facility], Dr. Onalee Hua APP for her melanoma and sees them on a regular basis.  She will be seeing her later this week.   Osteoporosis, mild comp rx: -dexa 03/2017 with osteoporosis -she does not want to take medications -did start Vit D3 1000 and is trying to get more consistent wt bearing exercise -agrees to monitoring and considering medication if not improving  HTN/Palpitations/Hx mild HLD: -chronic -meds: bystolic, used to see cardiologist with remote eval with normal echo and stress test per her report  Vaginal Atrophy: -uses premarin 1x per month  Hx Melanoma skin ca: -sees dermatologist at least 2x per year for this  OA knees and back: -sees Dr. Arvella Nigh at [facility] per her report -s/p bilat knee replacement   -Diet: variety of foods, balance and well rounded, larger portion sizes -Exercise: no regular exercise -Taking folic acid, vitamin D or calcium: no -Diabetes and Dyslipidemia Screening: Not fasting, but opted to do labs today -Vaccines: see vaccine section EPIC -pap history: 03/2017 normal -FDLMP: see nursing notes -sexual activity: yes, female partner, no new partners -wants STI testing (Hep C if born [birth year]): no -FH breast, colon or ovarian ca: see FH Last  mammogram: 03/2017 Last colon cancer screening: This was supposed to happen in December, however her gastroenterology office rescheduled the appointment to next month, may 2019 Breast Ca Risk Assessment: see family history and pt history DEXA (>/= 65): 2018, osteopenia  -Alcohol, Tobacco, drug use: see social history  Review of Systems - no fevers, unintentional weight loss, vision loss, hearing loss, chest pain, sob, hemoptysis, melena, hematochezia, hematuria, genital discharge, changing or concerning skin lesions, bleeding, bruising, loc, thoughts of self harm or SI  Past Medical History:  Diagnosis Date  . HTN (hypertension)   . Hypercholesteremia   . Melanoma of skin ([place]) 08/25/2015   sees dermatologist at least twice per year, Dr. Denna Haggard  . Osteoarthrosis, unspecified whether generalized or localized, involving lower leg 11/19/2014   of knees, s/p knee replacements bilat, sees [facility]  . Palpitations 08/25/2015  . Vaginal atrophy 08/25/2015    Past Surgical History:  Procedure Laterality Date  . APPENDECTOMY  2009  . CESAREAN SECTION    . KNEE SURGERY  2009   left  . WRIST SURGERY  2007   right    Family History  Problem Relation Age of Onset  . Heart disease Unknown     Social History   Socioeconomic History  . Marital status: Married    Spouse name: Not on file  . Number of children: 1  . Years of education: Not on file  . Highest education level: Not on file  Occupational History  . Occupation: Tree surgeon  Social Needs  . Financial resource strain: Not on file  .  Food insecurity:    Worry: Not on file    Inability: Not on file  . Transportation needs:    Medical: Not on file    Non-medical: Not on file  Tobacco Use  . Smoking status: Never Smoker  . Smokeless tobacco: Never Used  Substance and Sexual Activity  . Alcohol use: Yes    Alcohol/week: 1.8 oz    Types: 3 Glasses of wine per week  . Drug use: Not on file  . Sexual activity: Not on  file  Lifestyle  . Physical activity:    Days per week: Not on file    Minutes per session: Not on file  . Stress: Not on file  Relationships  . Social connections:    Talks on phone: Not on file    Gets together: Not on file    Attends religious service: Not on file    Active member of club or organization: Not on file    Attends meetings of clubs or organizations: Not on file    Relationship status: Not on file  Other Topics Concern  . Not on file  Social History Narrative   Work or School: Nurse, adult Situation: lives with husband      Spiritual Beliefs: catholic      Lifestyle: very active with cycling and tennis; healthy diet        Current Outpatient Medications:  .  BYSTOLIC 5 MG tablet, TAKE 1 TABLET BY MOUTH EVERY DAY, Disp: 30 tablet, Rfl: 3 .  Cholecalciferol (VITAMIN D3 PO), Take 1,000 Units by mouth daily., Disp: , Rfl:  .  conjugated estrogens (PREMARIN) vaginal cream, INSERT 1 APPLICATORFUL VAGINALLY EVERY 30 (THIRTY) DAYS., Disp: 30 g, Rfl: 11 .  hydrochlorothiazide (HYDRODIURIL) 25 MG tablet, Take 1 tablet (25 mg total) by mouth daily., Disp: 90 tablet, Rfl: 3 .  Magnesium 500 MG TABS, Take by mouth daily., Disp: , Rfl:  .  nebivolol (BYSTOLIC) 5 MG tablet, Take 1 tablet (5 mg total) by mouth daily., Disp: 30 tablet, Rfl: 11  EXAM:  Vitals:   03/11/18 1452  BP: 120/72  Pulse: 66  Temp: 98.3 F (36.8 C)    GENERAL: vitals reviewed and listed below, alert, oriented, appears well hydrated and in no acute distress  HEENT: head atraumatic, PERRLA, normal appearance of eyes, ears, nose and mouth. moist mucus membranes.  NECK: supple, no masses or lymphadenopathy  LUNGS: clear to auscultation bilaterally, no rales, rhonchi or wheeze  CV: HRRR, no peripheral edema or cyanosis, normal pedal pulses  ABDOMEN: bowel sounds normal, soft, non tender to palpation, no masses, no rebound or guarding  GU/BREAST: Declined, offered pelvic given her history  of melanoma, she prefers to discuss with her dermatologist and not do this today  SKIN: no rash or abnormal lesions  MS: normal gait, moves all extremities normally  NEURO: normal gait, speech and thought processing grossly intact, muscle tone grossly intact throughout  PSYCH: normal affect, pleasant and cooperative  ASSESSMENT AND PLAN:  Discussed the following assessment and plan:  PREVENTIVE EXAM: -Discussed and advised all [place] preventive services health task force level A and B recommendations for age, sex and risks. -Advised at least 150 minutes of exercise per week and a healthy diet with avoidance of (less then 1 serving per week) processed foods, white starches, red meat, fast foods and sweets and consisting of: * 5-9 servings of fresh fruits and vegetables (not corn or potatoes) *nuts and seeds,  beans *olives and olive oil *lean meats such as fish and white chicken  *whole grains -labs, studies and vaccines per orders this encounter - Hemoglobin A1c -form completed   2. Screening for depression -negative  3. Essential hypertension -much better, discussed options and she prefers to continue current treatment - Basic metabolic panel - CBC  4. Palpitations -she prefers to continue the bystolic  5. Pure hypercholesterolemia - HDL cholesterol - Cholesterol, total  6. Osteoporosis, unspecified osteoporosis type, unspecified pathological fracture presence -she has opted for vit d and exercise  7. Melanoma of skin ([place]) -sees dermatology -offered/advised pelvic, she opted to discuss with her dermatologist  Patient advised to return to clinic immediately if symptoms worsen or persist or new concerns.  Patient Instructions  BEFORE YOU LEAVE: -labs -follow up: 3-4 months  Talk with your dermatologist about pelvic exams for your history of melanoma.  We can do these, or we can have a gynecologist see you.  Get your colon cancer screening as planned.  We have  ordered labs or studies at this visit. It can take up to 1-2 weeks for results and processing. IF results require follow up or explanation, we will call you with instructions. Clinically stable results will be released to your [facility]. If you have not heard from [place] or cannot find your results in [facility] in 2 weeks please contact our office at [phone].  If you are not yet signed up for [facility], please consider signing up.   Preventive Care 40-64 Years, Female Preventive care refers to lifestyle choices and visits with your health care provider that can promote health and wellness. What does preventive care include?  A yearly physical exam. This is also called an annual well check.  Dental exams once or twice a year.  Routine eye exams. Ask your health care provider how often you should have your eyes checked.  Personal lifestyle choices, including: ? Daily care of your teeth and gums. ? Regular physical activity. ? Eating a healthy diet. ? Avoiding tobacco and drug use. ? Limiting alcohol use. ? Practicing safe sex. ? Taking low-dose aspirin daily starting at age 46. ? Taking vitamin and mineral supplements as recommended by your health care provider. What happens during an annual well check? The services and screenings done by your health care provider during your annual well check will depend on your age, overall health, lifestyle risk factors, and family history of disease. Counseling Your health care provider may ask you questions about your:  Alcohol use.  Tobacco use.  Drug use.  Emotional well-being.  Home and relationship well-being.  Sexual activity.  Eating habits.  Work and work Statistician.  Method of birth control.  Menstrual cycle.  Pregnancy history.  Screening You may have the following tests or measurements:  Height, weight, and BMI.  Blood pressure.  Lipid and cholesterol levels. These may be checked every 5 years, or more frequently if  you are over 25 years old.  Skin check.  Lung cancer screening. You may have this screening every year starting at age 29 if you have a 30-pack-year history of smoking and currently smoke or have quit within the past 15 years.  Fecal occult blood test (FOBT) of the stool. You may have this test every year starting at age 11.  Flexible sigmoidoscopy or colonoscopy. You may have a sigmoidoscopy every 5 years or a colonoscopy every 10 years starting at age 28.  Hepatitis C blood test.  Hepatitis B blood test.  Sexually transmitted disease (STD) testing.  Diabetes screening. This is done by checking your blood sugar (glucose) after you have not eaten for a while (fasting). You may have this done every 1-3 years.  Mammogram. This may be done every 1-2 years. Talk to your health care provider about when you should start having regular mammograms. This may depend on whether you have a family history of breast cancer.  BRCA-related cancer screening. This may be done if you have a family history of breast, ovarian, tubal, or peritoneal cancers.  Pelvic exam and Pap test. This may be done every 3 years starting at age 40. Starting at age 8, this may be done every 5 years if you have a Pap test in combination with an HPV test.  Bone density scan. This is done to screen for osteoporosis. You may have this scan if you are at high risk for osteoporosis.  Discuss your test results, treatment options, and if necessary, the need for more tests with your health care provider. Vaccines Your health care provider may recommend certain vaccines, such as:  Influenza vaccine. This is recommended every year.  Tetanus, diphtheria, and acellular pertussis (Tdap, Td) vaccine. You may need a Td booster every 10 years.  Varicella vaccine. You may need this if you have not been vaccinated.  Zoster vaccine. You may need this after age 34.  Measles, mumps, and rubella (MMR) vaccine. You may need at least one  dose of MMR if you were born in 1957 or later. You may also need a second dose.  Pneumococcal 13-valent conjugate (PCV13) vaccine. You may need this if you have certain conditions and were not previously vaccinated.  Pneumococcal polysaccharide (PPSV23) vaccine. You may need one or two doses if you smoke cigarettes or if you have certain conditions.  Meningococcal vaccine. You may need this if you have certain conditions.  Hepatitis A vaccine. You may need this if you have certain conditions or if you travel or work in places where you may be exposed to hepatitis A.  Hepatitis B vaccine. You may need this if you have certain conditions or if you travel or work in places where you may be exposed to hepatitis B.  Haemophilus influenzae type b (Hib) vaccine. You may need this if you have certain conditions.  Talk to your health care provider about which screenings and vaccines you need and how often you need them. This information is not intended to replace advice given to you by your health care provider. Make sure you discuss any questions you have with your health care provider. Document Released: 12/16/2015 Document Revised: 08/08/2016 Document Reviewed: 09/20/2015 Elsevier Interactive Patient Education  2018 [employer].          No follow-ups on file.  Lucretia Kern, DO

## 2018-03-12 LAB — HDL CHOLESTEROL: HDL: 96.6 mg/dL (ref >39.00)

## 2018-03-12 LAB — BASIC METABOLIC PANEL
BUN: 17 mg/dL (ref 6–23)
CALCIUM: 9.6 mg/dL (ref 8.4–10.5)
CHLORIDE: 94 meq/L — AB (ref 96–112)
CO2: 31 meq/L (ref 19–32)
Creatinine, Ser: 0.81 mg/dL (ref 0.40–1.20)
GFR: 75.69 mL/min (ref >60.00)
GLUCOSE: 88 mg/dL (ref 70–99)
Potassium: 4.1 mEq/L (ref 3.5–5.1)
SODIUM: 134 meq/L — AB (ref 135–145)

## 2018-03-12 LAB — CBC
HEMATOCRIT: 39.6 % (ref 36.0–46.0)
HEMOGLOBIN: 13.7 g/dL (ref 12.0–15.0)
MCHC: 34.6 g/dL (ref 30.0–36.0)
MCV: 101 fl — AB (ref 78.0–100.0)
PLATELETS: 243 10*3/uL (ref 150.0–400.0)
RBC: 3.92 Mil/uL (ref 3.87–5.11)
RDW: 12.9 % (ref 11.5–15.5)
WBC: 6.8 10*3/uL (ref 4.0–10.5)

## 2018-03-12 LAB — CHOLESTEROL, TOTAL: Cholesterol: 245 mg/dL — ABNORMAL HIGH (ref 0–200)

## 2018-03-12 LAB — HEMOGLOBIN A1C: Hgb A1c MFr Bld: 5.2 % (ref 4.6–6.5)

## 2018-03-19 ENCOUNTER — Other Ambulatory Visit (INDEPENDENT_AMBULATORY_CARE_PROVIDER_SITE_OTHER): Payer: BLUE CROSS/BLUE SHIELD

## 2018-03-19 DIAGNOSIS — R7989 Other specified abnormal findings of blood chemistry: Secondary | ICD-10-CM | POA: Diagnosis not present

## 2018-03-19 LAB — CBC WITH DIFFERENTIAL/PLATELET
BASOS ABS: 0.1 10*3/uL (ref 0.0–0.1)
Basophils Relative: 1 % (ref 0.0–3.0)
EOS ABS: 0.1 10*3/uL (ref 0.0–0.7)
Eosinophils Relative: 2.1 % (ref 0.0–5.0)
HEMATOCRIT: 38.9 % (ref 36.0–46.0)
Hemoglobin: 13.4 g/dL (ref 12.0–15.0)
LYMPHS PCT: 34.4 % (ref 12.0–46.0)
Lymphs Abs: 1.9 10*3/uL (ref 0.7–4.0)
MCHC: 34.4 g/dL (ref 30.0–36.0)
MCV: 99.5 fl (ref 78.0–100.0)
MONO ABS: 0.7 10*3/uL (ref 0.1–1.0)
Monocytes Relative: 12.6 % — ABNORMAL HIGH (ref 3.0–12.0)
NEUTROS ABS: 2.7 10*3/uL (ref 1.4–7.7)
Neutrophils Relative %: 49.9 % (ref 43.0–77.0)
PLATELETS: 233 10*3/uL (ref 150.0–400.0)
RBC: 3.91 Mil/uL (ref 3.87–5.11)
RDW: 13 % (ref 11.5–15.5)
WBC: 5.5 10*3/uL (ref 4.0–10.5)

## 2018-03-19 LAB — VITAMIN B12: Vitamin B-12: 138 pg/mL — ABNORMAL LOW (ref 211–911)

## 2018-03-24 ENCOUNTER — Telehealth: Payer: Self-pay | Admitting: Family Medicine

## 2018-03-24 ENCOUNTER — Ambulatory Visit: Payer: Self-pay | Admitting: *Deleted

## 2018-03-24 NOTE — Telephone Encounter (Signed)
Copied from [place] [phone]. Topic: Quick Communication - See Telephone Encounter >> Mar 24, 2018 12:15 PM Rutherford Nail, NT wrote: CRM for notification. See Telephone encounter for: 03/24/18. Patient calling and states that she received her MyChart results and she has a few questions about those.

## 2018-03-24 NOTE — Telephone Encounter (Signed)
Lab  Results  And  Plan of   Care  With   b12   Results   Given to patient    By    Leandra Kern      V[address]  Conference

## 2018-03-24 NOTE — Telephone Encounter (Signed)
Pt notified of results/instructions and verbalized understanding. Discussed B12 levels and possible causes of low B12 including medications, GI surgery, etc. She does not appear to have h/o this, so I am not sure what is causing her B12 deficiency at this time. Patient also asked if any additional workup was indicated at this time for possible cause of B12 deficiency such as Crohn's disease. I advised that in the absence of symptoms, no additional workup is indicated at this time. Patient verbalized understanding and agreed to plan.

## 2018-03-24 NOTE — Telephone Encounter (Signed)
  Reason for Disposition . MODERATE-SEVERE tinnitus (i.e., interferes with work, school, or sleep)  Answer Assessment - Initial Assessment Questions 1. DESCRIPTION: "Describe the sound you are hearing." (e.g., hissing, humming, pounding, ringing)        Ringing in  Ears    2. LOCATION: "One or both ears?" If one, ask: "Which ear?"     r  Ear   3. SEVERITY: "How bad is it?"    - MILD - doesn't interfere with normal activities, only can hear in a quiet room    - MODERATE-SEVERE (Bothersome): interferes with work, school, sleep, or other activities       Bothersome  Mod   4. ONSET: "When did this begin?" "Did it start suddenly or come on gradually?"    Has  Had   For  Years  Sudden increase in last  Several  Weeks    5. PATTERN: "Does this come and go, or has it been constant since it started?"       Always  Constant  Know  Worse   6. HEARING LOSS: "Is your hearing decreased?" (e.g., normal, decreased)        Hearing loss  r  Ear   7. OTHER SYMPTOMS: "Do you have any other symptoms?" (e.g., dizziness, earache)        no 8. PREGNANCY: "Is there any chance you are pregnant?" "When was your last menstrual period?"    n/a  Protocols used: [facility]

## 2018-03-25 ENCOUNTER — Ambulatory Visit (INDEPENDENT_AMBULATORY_CARE_PROVIDER_SITE_OTHER): Payer: BLUE CROSS/BLUE SHIELD | Admitting: Family Medicine

## 2018-03-25 ENCOUNTER — Encounter: Payer: Self-pay | Admitting: Family Medicine

## 2018-03-25 VITALS — BP 100/80 | HR 58 | Temp 98.2°F | Ht 64.0 in | Wt 153.5 lb

## 2018-03-25 DIAGNOSIS — E538 Deficiency of other specified B group vitamins: Secondary | ICD-10-CM

## 2018-03-25 DIAGNOSIS — H6981 Other specified disorders of Eustachian tube, right ear: Secondary | ICD-10-CM

## 2018-03-25 DIAGNOSIS — H9311 Tinnitus, right ear: Secondary | ICD-10-CM

## 2018-03-25 DIAGNOSIS — J302 Other seasonal allergic rhinitis: Secondary | ICD-10-CM

## 2018-03-25 DIAGNOSIS — H6991 Unspecified Eustachian tube disorder, right ear: Secondary | ICD-10-CM

## 2018-03-25 MED ORDER — CYANOCOBALAMIN 1000 MCG/ML IJ SOLN
1000.0000 ug | Freq: Once | INTRAMUSCULAR | Status: AC
  Administered 2018-03-25: 1000 ug via INTRAMUSCULAR

## 2018-03-25 NOTE — Progress Notes (Signed)
HPI:  Using dictation device. Unfortunately this device frequently misinterprets words/phrases.  Acute visit for tinnitus: -R ear -reports for many years, saw ENT in the past, reports was from damage to inner ear from prior infection -tinnitus high pitched, constant, a little worse and she is worried given the b12 def -denies fevers, pain, pulsatile tinnitus -mild allergy issues  ROS: See pertinent positives and negatives per HPI.  Past Medical History:  Diagnosis Date  . HTN (hypertension)   . Hypercholesteremia   . Melanoma of skin ([place]) 08/25/2015   sees dermatologist at least twice per year, Dr. Denna Haggard  . Osteoarthrosis, unspecified whether generalized or localized, involving lower leg 11/19/2014   of knees, s/p knee replacements bilat, sees [facility]  . Palpitations 08/25/2015  . Vaginal atrophy 08/25/2015    Past Surgical History:  Procedure Laterality Date  . APPENDECTOMY  2009  . CESAREAN SECTION    . KNEE SURGERY  2009   left  . WRIST SURGERY  2007   right    Family History  Problem Relation Age of Onset  . Heart disease Unknown     SOCIAL HX: see hpi   Current Outpatient Medications:  .  BYSTOLIC 5 MG tablet, TAKE 1 TABLET BY MOUTH EVERY DAY, Disp: 30 tablet, Rfl: 3 .  Cholecalciferol (VITAMIN D3 PO), Take 1,000 Units by mouth daily., Disp: , Rfl:  .  conjugated estrogens (PREMARIN) vaginal cream, INSERT 1 APPLICATORFUL VAGINALLY EVERY 30 (THIRTY) DAYS., Disp: 30 g, Rfl: 11 .  hydrochlorothiazide (HYDRODIURIL) 25 MG tablet, Take 1 tablet (25 mg total) by mouth daily., Disp: 90 tablet, Rfl: 3 .  Magnesium 500 MG TABS, Take by mouth daily., Disp: , Rfl:  .  nebivolol (BYSTOLIC) 5 MG tablet, Take 1 tablet (5 mg total) by mouth daily., Disp: 30 tablet, Rfl: 11  Current Facility-Administered Medications:  .  cyanocobalamin ((VITAMIN B-12)) injection 1,000 mcg, 1,000 mcg, Intramuscular, Once, Colin Benton R, DO  EXAM:  Vitals:   03/25/18 1628  BP: 100/80   Pulse: (!) 58  Temp: 98.2 F (36.8 C)  SpO2: 98%    Body mass index is 26.35 kg/m.  GENERAL: vitals reviewed and listed above, alert, oriented, appears well hydrated and in no acute distress  HEENT: atraumatic, conjunttiva clear, no obvious abnormalities on inspection of external nose and ears, normal appearance of ear canals and TMs except for scarring R TM with clear effusion, clear nasal congestion, boggy turbinates, mild post oropharyngeal erythema with PND, no tonsillar edema or exudate, no sinus TTP  NECK: no obvious masses on inspection  LUNGS: clear to auscultation bilaterally, no wheezes, rales or rhonchi, good air movement  CV: HRRR, no peripheral edema  MS: moves all extremities without noticeable abnormality  PSYCH: pleasant and cooperative, no obvious depression or anxiety  ASSESSMENT AND PLAN:  Discussed the following assessment and plan:  B12 deficiency - Plan: cyanocobalamin ((VITAMIN B-12)) injection 1,000 mcg  Tinnitus of right ear  Dysfunction of right eustachian tube  Seasonal allergies  -clear MEE on exam, perhaps tinnitus worsened from this, likely 2ndary to ETD/seasonal allergies -opted to try flonase with ENT eval if does not resolve or worsens -discussed b12 deficiency and tx --> injections, followed by oral supplementation, she is getting injection today -Patient advised to return or notify a doctor immediately if symptoms worsen or persist or new concerns arise.  Patient Instructions  flonase 2 sprays each nostril daily for 1 month  See Ear, nose and throat specialist if ear  issues persist  b12 injection     Lucretia Kern, DO

## 2018-03-25 NOTE — Patient Instructions (Addendum)
flonase 2 sprays each nostril daily for 1 month  See Ear, nose and throat specialist if ear issues persist  b12 injection

## 2018-04-04 ENCOUNTER — Ambulatory Visit: Payer: BLUE CROSS/BLUE SHIELD

## 2018-04-07 ENCOUNTER — Ambulatory Visit (INDEPENDENT_AMBULATORY_CARE_PROVIDER_SITE_OTHER): Payer: BLUE CROSS/BLUE SHIELD | Admitting: *Deleted

## 2018-04-07 DIAGNOSIS — E538 Deficiency of other specified B group vitamins: Secondary | ICD-10-CM

## 2018-04-07 MED ORDER — CYANOCOBALAMIN 1000 MCG/ML IJ SOLN
1000.0000 ug | Freq: Once | INTRAMUSCULAR | Status: AC
  Administered 2018-04-07: 1000 ug via INTRAMUSCULAR

## 2018-04-07 NOTE — Progress Notes (Signed)
Per orders of Dr. Kim, injection of B12 given by Chrisette Man J Journey Ratterman. Patient tolerated injection well. 

## 2018-04-08 ENCOUNTER — Encounter: Payer: Self-pay | Admitting: Family Medicine

## 2018-04-08 LAB — HM COLONOSCOPY

## 2018-04-12 ENCOUNTER — Encounter (HOSPITAL_COMMUNITY): Payer: Self-pay

## 2018-04-12 ENCOUNTER — Emergency Department (HOSPITAL_COMMUNITY)
Admission: EM | Admit: 2018-04-12 | Discharge: 2018-04-12 | Disposition: A | Discharge status: Home / Self Care | Payer: BLUE CROSS/BLUE SHIELD | Attending: Emergency Medicine | Admitting: Emergency Medicine

## 2018-04-12 ENCOUNTER — Emergency Department (HOSPITAL_COMMUNITY): Payer: BLUE CROSS/BLUE SHIELD

## 2018-04-12 ENCOUNTER — Other Ambulatory Visit: Payer: Self-pay

## 2018-04-12 DIAGNOSIS — S60222A Contusion of left hand, initial encounter: Secondary | ICD-10-CM | POA: Diagnosis not present

## 2018-04-12 DIAGNOSIS — E7800 Pure hypercholesterolemia, unspecified: Secondary | ICD-10-CM | POA: Diagnosis not present

## 2018-04-12 DIAGNOSIS — I10 Essential (primary) hypertension: Secondary | ICD-10-CM | POA: Diagnosis not present

## 2018-04-12 DIAGNOSIS — X58XXXA Exposure to other specified factors, initial encounter: Secondary | ICD-10-CM | POA: Insufficient documentation

## 2018-04-12 DIAGNOSIS — Y929 Unspecified place or not applicable: Secondary | ICD-10-CM | POA: Diagnosis not present

## 2018-04-12 DIAGNOSIS — Z23 Encounter for immunization: Secondary | ICD-10-CM | POA: Diagnosis not present

## 2018-04-12 DIAGNOSIS — Z4800 Encounter for change or removal of nonsurgical wound dressing: Secondary | ICD-10-CM | POA: Diagnosis present

## 2018-04-12 DIAGNOSIS — S0181XD Laceration without foreign body of other part of head, subsequent encounter: Secondary | ICD-10-CM | POA: Insufficient documentation

## 2018-04-12 DIAGNOSIS — Z5189 Encounter for other specified aftercare: Secondary | ICD-10-CM

## 2018-04-12 DIAGNOSIS — Z79899 Other long term (current) drug therapy: Secondary | ICD-10-CM | POA: Insufficient documentation

## 2018-04-12 DIAGNOSIS — Y9355 Activity, bike riding: Secondary | ICD-10-CM | POA: Diagnosis not present

## 2018-04-12 DIAGNOSIS — Y998 Other external cause status: Secondary | ICD-10-CM | POA: Insufficient documentation

## 2018-04-12 DIAGNOSIS — S0990XA Unspecified injury of head, initial encounter: Secondary | ICD-10-CM | POA: Insufficient documentation

## 2018-04-12 DIAGNOSIS — S0181XA Laceration without foreign body of other part of head, initial encounter: Secondary | ICD-10-CM | POA: Insufficient documentation

## 2018-04-12 MED ORDER — TETANUS-DIPHTH-ACELL PERTUSSIS 5-2.5-18.5 LF-MCG/0.5 IM SUSP
0.5000 mL | Freq: Once | INTRAMUSCULAR | Status: AC
  Administered 2018-04-12: 0.5 mL via INTRAMUSCULAR
  Filled 2018-04-12: qty 0.5

## 2018-04-12 MED ORDER — ACETAMINOPHEN 325 MG PO TABS
650.0000 mg | ORAL_TABLET | Freq: Once | ORAL | Status: AC
  Administered 2018-04-12: 650 mg via ORAL
  Filled 2018-04-12: qty 2
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
## 2018-04-12 NOTE — ED Triage Notes (Addendum)
Pt seen here today. Lac on chin started "gushing" blood while eating dinner. Bleeding controlled in triage.. Pt states she got bleeding to stop with compress.

## 2018-04-12 NOTE — ED Notes (Signed)
Verbalized understanding discharge instructions. In no acute distress.   

## 2018-04-12 NOTE — ED Notes (Signed)
Bed: WA03 Expected date:  Expected time:  Means of arrival:  Comments: 

## 2018-04-12 NOTE — Discharge Instructions (Addendum)
Return for any signs of infection

## 2018-04-12 NOTE — ED Notes (Signed)
Bed: WTR6 Expected date:  Expected time:  Means of arrival:  Comments: 
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
## 2018-04-14 ENCOUNTER — Ambulatory Visit (INDEPENDENT_AMBULATORY_CARE_PROVIDER_SITE_OTHER): Payer: BLUE CROSS/BLUE SHIELD | Admitting: Family Medicine

## 2018-04-14 DIAGNOSIS — E538 Deficiency of other specified B group vitamins: Secondary | ICD-10-CM | POA: Diagnosis not present

## 2018-04-14 MED ORDER — CYANOCOBALAMIN 1000 MCG/ML IJ SOLN
1000.0000 ug | Freq: Once | INTRAMUSCULAR | Status: AC
  Administered 2018-04-14: 1000 ug via INTRAMUSCULAR

## 2018-04-14 NOTE — Progress Notes (Signed)
Per orders of Dr. Banks, injection of Vitamin B 12 given by Megham Dwyer ANN. Patient tolerated injection well. 

## 2018-04-22 ENCOUNTER — Other Ambulatory Visit: Payer: Self-pay | Admitting: Family Medicine

## 2018-04-25 ENCOUNTER — Ambulatory Visit (INDEPENDENT_AMBULATORY_CARE_PROVIDER_SITE_OTHER): Payer: BLUE CROSS/BLUE SHIELD | Admitting: Family Medicine

## 2018-04-25 DIAGNOSIS — E538 Deficiency of other specified B group vitamins: Secondary | ICD-10-CM | POA: Diagnosis not present

## 2018-04-25 MED ORDER — CYANOCOBALAMIN 1000 MCG/ML IJ SOLN
1000.0000 ug | Freq: Once | INTRAMUSCULAR | Status: AC
  Administered 2018-04-25: 1000 ug via INTRAMUSCULAR

## 2018-04-25 NOTE — Progress Notes (Signed)
Per orders of Dr. Volanda Napoleon, injection of Vitamin B 12 given by Aggie Hacker ANN. Patient tolerated injection well.   Dr. Maudie Mercury out of the office today.

## 2018-05-02 ENCOUNTER — Ambulatory Visit (INDEPENDENT_AMBULATORY_CARE_PROVIDER_SITE_OTHER): Payer: BLUE CROSS/BLUE SHIELD | Admitting: Family Medicine

## 2018-05-02 DIAGNOSIS — E538 Deficiency of other specified B group vitamins: Secondary | ICD-10-CM | POA: Diagnosis not present

## 2018-05-02 MED ORDER — CYANOCOBALAMIN 1000 MCG/ML IJ SOLN
1000.0000 ug | Freq: Once | INTRAMUSCULAR | Status: AC
  Administered 2018-05-02: 1000 ug via INTRAMUSCULAR

## 2018-05-02 NOTE — Progress Notes (Signed)
Per orders of Dr. Volanda Napoleon, injection of Vitamin B 12 given by Aggie Hacker ANN. Patient tolerated injection well.   Dr. Maudie Mercury is out of the office today.

## 2018-05-21 ENCOUNTER — Telehealth: Payer: Self-pay | Admitting: *Deleted

## 2018-05-21 NOTE — Telephone Encounter (Signed)
PA for Bystolic initiated via CoverMyMeds. Awaiting determination. Key: [id number]

## 2018-05-26 NOTE — Telephone Encounter (Signed)
PA denied per CoverMyMeds. Additional information will be provided by fax.

## 2018-05-27 NOTE — Telephone Encounter (Signed)
Fax received stating the request was denied as the medication may be covered when two alternative medications on the pts formularly have been tried and did not work.  Alternatives include: Atenolol, Bisoprolol, Carvedilol, Metoprolol Succinate,etc and this letter was given to Dr Maudie Mercury.

## 2018-05-27 NOTE — Telephone Encounter (Signed)
Please schedule appointment to address.  Initially prescribed by a cardiologist to help with palpitations.  She should not stop this medication in the interim.

## 2018-05-27 NOTE — Telephone Encounter (Signed)
Copied from [place]. Topic: Quick Communication - Office Called Patient >> May 27, 2018  4:02 PM Valla Leaver wrote: Patient would liek a cal back from Denice Paradise to find out the name of the medication she can take instead of Bystolic?

## 2018-05-27 NOTE — Telephone Encounter (Signed)
I called the pt and she stated she showed Dr Maudie Mercury a letter previously stating Bystolic was denied and other options were listed but Dr Maudie Mercury did not make a recommendation at that time.  Patient stated she has been taking this daily, she paid out of pocket for this and has a 3 month supply and questioned if an alternative could be given instead.   Message sent to Dr Maudie Mercury.

## 2018-05-27 NOTE — Telephone Encounter (Signed)
I left a detailed message with the information below at the pts cell number.  CRM also created.

## 2018-05-28 NOTE — Telephone Encounter (Signed)
We discussed this > 3 months ago and she wanted to continue bystolic despite the cost. If she has now changed her mind, I do recommend a visit to see where her BP is and to also discuss options and transition as it is not a simple decision. Thanks.

## 2018-05-29 NOTE — Telephone Encounter (Signed)
I called the pt and informed her of the message below.  Patient stated she will be out of town for the next few weeks and an appt was scheduled for 7/22.

## 2018-06-16 ENCOUNTER — Other Ambulatory Visit: Payer: BLUE CROSS/BLUE SHIELD

## 2018-06-16 ENCOUNTER — Other Ambulatory Visit: Payer: Self-pay

## 2018-06-16 ENCOUNTER — Other Ambulatory Visit: Payer: Self-pay | Admitting: Orthopedic Surgery

## 2018-06-16 ENCOUNTER — Encounter (HOSPITAL_BASED_OUTPATIENT_CLINIC_OR_DEPARTMENT_OTHER): Payer: Self-pay | Admitting: *Deleted

## 2018-06-16 ENCOUNTER — Encounter (HOSPITAL_BASED_OUTPATIENT_CLINIC_OR_DEPARTMENT_OTHER)
Admission: RE | Admit: 2018-06-16 | Discharge: 2018-06-16 | Disposition: A | Discharge status: Home / Self Care | Payer: BLUE CROSS/BLUE SHIELD | Source: Ambulatory Visit | Attending: Orthopedic Surgery | Admitting: Orthopedic Surgery

## 2018-06-16 DIAGNOSIS — Z01818 Encounter for other preprocedural examination: Secondary | ICD-10-CM | POA: Insufficient documentation

## 2018-06-16 DIAGNOSIS — I517 Cardiomegaly: Secondary | ICD-10-CM | POA: Insufficient documentation

## 2018-06-16 DIAGNOSIS — R001 Bradycardia, unspecified: Secondary | ICD-10-CM | POA: Diagnosis not present

## 2018-06-16 DIAGNOSIS — Z01810 Encounter for preprocedural cardiovascular examination: Secondary | ICD-10-CM | POA: Diagnosis present

## 2018-06-16 LAB — BASIC METABOLIC PANEL
Anion gap: 10 (ref 5–15)
BUN: 17 mg/dL (ref 8–23)
CALCIUM: 10.1 mg/dL (ref 8.9–10.3)
CO2: 28 mmol/L (ref 22–32)
CREATININE: 0.76 mg/dL (ref 0.44–1.00)
Chloride: 97 mmol/L — ABNORMAL LOW (ref 98–111)
GFR calc non Af Amer: >60 mL/min (ref >60)
GLUCOSE: 106 mg/dL — AB (ref 70–99)
Potassium: 3.8 mmol/L (ref 3.5–5.1)
Sodium: 135 mmol/L (ref 135–145)

## 2018-06-16 NOTE — Progress Notes (Signed)
Dr. Royce Macadamia reviewed EKG and compared to previous EKG - ok for surgery.

## 2018-06-18 ENCOUNTER — Encounter (HOSPITAL_BASED_OUTPATIENT_CLINIC_OR_DEPARTMENT_OTHER): Payer: Self-pay | Admitting: Emergency Medicine

## 2018-06-19 ENCOUNTER — Ambulatory Visit (HOSPITAL_BASED_OUTPATIENT_CLINIC_OR_DEPARTMENT_OTHER)
Admission: RE | Admit: 2018-06-19 | Discharge: 2018-06-19 | Disposition: A | Discharge status: Home / Self Care | Payer: BLUE CROSS/BLUE SHIELD | Source: Ambulatory Visit | Attending: Orthopedic Surgery | Admitting: Orthopedic Surgery

## 2018-06-19 ENCOUNTER — Encounter (HOSPITAL_BASED_OUTPATIENT_CLINIC_OR_DEPARTMENT_OTHER)
Admission: RE | Disposition: A | Discharge status: Home / Self Care | Payer: Self-pay | Source: Ambulatory Visit | Attending: Orthopedic Surgery

## 2018-06-19 ENCOUNTER — Encounter (HOSPITAL_BASED_OUTPATIENT_CLINIC_OR_DEPARTMENT_OTHER): Payer: Self-pay

## 2018-06-19 ENCOUNTER — Ambulatory Visit (HOSPITAL_BASED_OUTPATIENT_CLINIC_OR_DEPARTMENT_OTHER): Payer: BLUE CROSS/BLUE SHIELD | Admitting: Certified Registered"

## 2018-06-19 ENCOUNTER — Other Ambulatory Visit: Payer: Self-pay

## 2018-06-19 DIAGNOSIS — R002 Palpitations: Secondary | ICD-10-CM | POA: Insufficient documentation

## 2018-06-19 DIAGNOSIS — I10 Essential (primary) hypertension: Secondary | ICD-10-CM | POA: Insufficient documentation

## 2018-06-19 DIAGNOSIS — S63055A Dislocation of other carpometacarpal joint of left hand, initial encounter: Secondary | ICD-10-CM | POA: Diagnosis present

## 2018-06-19 DIAGNOSIS — Z85820 Personal history of malignant melanoma of skin: Secondary | ICD-10-CM | POA: Insufficient documentation

## 2018-06-19 DIAGNOSIS — E7800 Pure hypercholesterolemia, unspecified: Secondary | ICD-10-CM | POA: Diagnosis not present

## 2018-06-19 DIAGNOSIS — X58XXXA Exposure to other specified factors, initial encounter: Secondary | ICD-10-CM | POA: Insufficient documentation

## 2018-06-19 DIAGNOSIS — Z8249 Family history of ischemic heart disease and other diseases of the circulatory system: Secondary | ICD-10-CM | POA: Insufficient documentation

## 2018-06-19 DIAGNOSIS — M1990 Unspecified osteoarthritis, unspecified site: Secondary | ICD-10-CM | POA: Insufficient documentation

## 2018-06-19 DIAGNOSIS — Z79899 Other long term (current) drug therapy: Secondary | ICD-10-CM | POA: Insufficient documentation

## 2018-06-19 DIAGNOSIS — Z96653 Presence of artificial knee joint, bilateral: Secondary | ICD-10-CM | POA: Insufficient documentation

## 2018-06-19 HISTORY — PX: OPEN REDUCTION INTERNAL FIXATION (ORIF) METACARPAL: SHX6234

## 2018-06-19 SURGERY — OPEN REDUCTION INTERNAL FIXATION (ORIF) METACARPAL
Anesthesia: General | Site: Hand | Laterality: Left

## 2018-06-19 MED ORDER — BUPIVACAINE HCL (PF) 0.25 % IJ SOLN
INTRAMUSCULAR | Status: DC | PRN
  Administered 2018-06-19: 5 mL

## 2018-06-19 MED ORDER — EPHEDRINE SULFATE 50 MG/ML IJ SOLN
INTRAMUSCULAR | Status: DC | PRN
  Administered 2018-06-19 (×2): 10 mg via INTRAVENOUS

## 2018-06-19 MED ORDER — LACTATED RINGERS IV SOLN
INTRAVENOUS | Status: DC
  Administered 2018-06-19 (×2): via INTRAVENOUS

## 2018-06-19 MED ORDER — LIDOCAINE HCL (CARDIAC) PF 100 MG/5ML IV SOSY
PREFILLED_SYRINGE | INTRAVENOUS | Status: DC | PRN
  Administered 2018-06-19: 100 mg via INTRAVENOUS

## 2018-06-19 MED ORDER — SCOPOLAMINE 1 MG/3DAYS TD PT72: 1.0000 | MEDICATED_PATCH | Freq: Once | TRANSDERMAL | Status: DC | PRN

## 2018-06-19 MED ORDER — DEXAMETHASONE SODIUM PHOSPHATE 10 MG/ML IJ SOLN
INTRAMUSCULAR | Status: AC
  Filled 2018-06-19: qty 1

## 2018-06-19 MED ORDER — MEPERIDINE HCL 25 MG/ML IJ SOLN: 6.2500 mg | INTRAMUSCULAR | Status: DC | PRN

## 2018-06-19 MED ORDER — CEFAZOLIN SODIUM-DEXTROSE 2-4 GM/100ML-% IV SOLN
2.0000 g | INTRAVENOUS | Status: AC
  Administered 2018-06-19: 2 g via INTRAVENOUS

## 2018-06-19 MED ORDER — FENTANYL CITRATE (PF) 100 MCG/2ML IJ SOLN
INTRAMUSCULAR | Status: AC
  Filled 2018-06-19: qty 2

## 2018-06-19 MED ORDER — ONDANSETRON HCL 4 MG/2ML IJ SOLN
INTRAMUSCULAR | Status: AC
  Filled 2018-06-19: qty 2

## 2018-06-19 MED ORDER — OXYCODONE HCL 5 MG/5ML PO SOLN: 5.0000 mg | Freq: Once | ORAL | Status: DC | PRN

## 2018-06-19 MED ORDER — FENTANYL CITRATE (PF) 100 MCG/2ML IJ SOLN
50.0000 ug | INTRAMUSCULAR | Status: AC | PRN
  Administered 2018-06-19 (×3): 50 ug via INTRAVENOUS
  Administered 2018-06-19 (×2): 25 ug via INTRAVENOUS

## 2018-06-19 MED ORDER — PROPOFOL 10 MG/ML IV BOLUS
INTRAVENOUS | Status: DC | PRN
  Administered 2018-06-19: 170 mg via INTRAVENOUS
  Administered 2018-06-19: 20 mg via INTRAVENOUS

## 2018-06-19 MED ORDER — CHLORHEXIDINE GLUCONATE 4 % EX LIQD: 60.0000 mL | Freq: Once | CUTANEOUS | Status: DC

## 2018-06-19 MED ORDER — MIDAZOLAM HCL 2 MG/2ML IJ SOLN
INTRAMUSCULAR | Status: AC
  Filled 2018-06-19: qty 2

## 2018-06-19 MED ORDER — PROMETHAZINE HCL 25 MG/ML IJ SOLN: 6.2500 mg | INTRAMUSCULAR | Status: DC | PRN

## 2018-06-19 MED ORDER — DEXAMETHASONE SODIUM PHOSPHATE 10 MG/ML IJ SOLN
INTRAMUSCULAR | Status: DC | PRN
  Administered 2018-06-19: 10 mg via INTRAVENOUS

## 2018-06-19 MED ORDER — HYDROMORPHONE HCL 1 MG/ML IJ SOLN
0.2500 mg | INTRAMUSCULAR | Status: DC | PRN
  Administered 2018-06-19: 0.25 mg via INTRAVENOUS
  Administered 2018-06-19 (×2): 0.5 mg via INTRAVENOUS

## 2018-06-19 MED ORDER — OXYCODONE HCL 5 MG PO TABS: 5.0000 mg | ORAL_TABLET | Freq: Once | ORAL | Status: DC | PRN

## 2018-06-19 MED ORDER — HYDROMORPHONE HCL 1 MG/ML IJ SOLN
INTRAMUSCULAR | Status: AC
  Filled 2018-06-19: qty 0.5

## 2018-06-19 MED ORDER — ONDANSETRON HCL 4 MG/2ML IJ SOLN
INTRAMUSCULAR | Status: DC | PRN
  Administered 2018-06-19: 4 mg via INTRAVENOUS

## 2018-06-19 MED ORDER — CEFAZOLIN SODIUM-DEXTROSE 2-4 GM/100ML-% IV SOLN
INTRAVENOUS | Status: AC
  Filled 2018-06-19: qty 100

## 2018-06-19 MED ORDER — HYDROMORPHONE HCL 1 MG/ML IJ SOLN
INTRAMUSCULAR | Status: AC
Start: 2018-06-19 — End: ?
  Filled 2018-06-19: qty 0.5

## 2018-06-19 MED ORDER — MIDAZOLAM HCL 2 MG/2ML IJ SOLN
1.0000 mg | INTRAMUSCULAR | Status: DC | PRN
  Administered 2018-06-19: 2 mg via INTRAVENOUS

## 2018-06-19 MED ORDER — LIDOCAINE HCL (CARDIAC) PF 100 MG/5ML IV SOSY
PREFILLED_SYRINGE | INTRAVENOUS | Status: AC
  Filled 2018-06-19: qty 5

## 2018-06-19 MED ORDER — HYDROCODONE-ACETAMINOPHEN 5-325 MG PO TABS: ORAL_TABLET | ORAL | 0 refills | Status: DC

## 2018-06-19 SURGICAL SUPPLY — 44 items
BANDAGE ACE 3X5.8 VEL STRL LF (GAUZE/BANDAGES/DRESSINGS) ×2 IMPLANT
BLADE SURG 15 STRL LF DISP TIS (BLADE) ×2 IMPLANT
BLADE SURG 15 STRL SS (BLADE) ×2
BNDG ESMARK 4X9 LF (GAUZE/BANDAGES/DRESSINGS) ×2 IMPLANT
BNDG GAUZE ELAST 4 BULKY (GAUZE/BANDAGES/DRESSINGS) ×2 IMPLANT
CHLORAPREP W/TINT 26ML (MISCELLANEOUS) ×2 IMPLANT
CORD BIPOLAR FORCEPS 12FT (ELECTRODE) ×2 IMPLANT
COVER BACK TABLE 60X90IN (DRAPES) ×2 IMPLANT
COVER MAYO STAND STRL (DRAPES) ×2 IMPLANT
CUFF TOURNIQUET SINGLE 18IN (TOURNIQUET CUFF) ×2 IMPLANT
DRAPE EXTREMITY T 121X128X90 (DRAPE) ×2 IMPLANT
DRAPE OEC MINIVIEW 54X84 (DRAPES) ×2 IMPLANT
DRAPE SURG 17X23 STRL (DRAPES) ×2 IMPLANT
GAUZE SPONGE 4X4 12PLY STRL (GAUZE/BANDAGES/DRESSINGS) ×2 IMPLANT
GAUZE XEROFORM 1X8 LF (GAUZE/BANDAGES/DRESSINGS) ×2 IMPLANT
GLOVE BIO SURGEON STRL SZ7.5 (GLOVE) ×2 IMPLANT
GLOVE BIOGEL PI IND STRL 8 (GLOVE) ×2 IMPLANT
GLOVE BIOGEL PI INDICATOR 8 (GLOVE) ×2
GLOVE SURG SS PI 7.0 STRL IVOR (GLOVE) ×2 IMPLANT
GOWN STRL REUS W/ TWL LRG LVL3 (GOWN DISPOSABLE) ×1 IMPLANT
GOWN STRL REUS W/TWL LRG LVL3 (GOWN DISPOSABLE) ×1
GOWN STRL REUS W/TWL XL LVL3 (GOWN DISPOSABLE) ×2 IMPLANT
NEEDLE HYPO 25X1 1.5 SAFETY (NEEDLE) ×2 IMPLANT
NS IRRIG 1000ML POUR BTL (IV SOLUTION) ×2 IMPLANT
PACK BASIN DAY SURGERY FS (CUSTOM PROCEDURE TRAY) ×2 IMPLANT
PAD CAST 4YDX4 CTTN HI CHSV (CAST SUPPLIES) ×1 IMPLANT
PADDING CAST COTTON 4X4 STRL (CAST SUPPLIES) ×1
SLEEVE SCD COMPRESS KNEE MED (MISCELLANEOUS) ×2 IMPLANT
SPLINT PLASTER CAST XFAST 3X15 (CAST SUPPLIES) IMPLANT
SPLINT PLASTER CAST XFAST 4X15 (CAST SUPPLIES) IMPLANT
SPLINT PLASTER XTRA FAST SET 4 (CAST SUPPLIES)
SPLINT PLASTER XTRA FASTSET 3X (CAST SUPPLIES)
STOCKINETTE 4X48 STRL (DRAPES) ×2 IMPLANT
SUT CHROMIC 4 0 PS 2 18 (SUTURE) ×2 IMPLANT
SUT ETHILON 3 0 PS 1 (SUTURE) IMPLANT
SUT ETHILON 4 0 PS 2 18 (SUTURE) ×2 IMPLANT
SUT MERSILENE 4 0 P 3 (SUTURE) IMPLANT
SUT VIC AB 3-0 PS1 18 (SUTURE)
SUT VIC AB 3-0 PS1 18XBRD (SUTURE) IMPLANT
SUT VICRYL 4-0 PS2 18IN ABS (SUTURE) ×2 IMPLANT
SYR BULB 3OZ (MISCELLANEOUS) ×2 IMPLANT
SYR CONTROL 10ML LL (SYRINGE) ×2 IMPLANT
TOWEL GREEN STERILE FF (TOWEL DISPOSABLE) ×4 IMPLANT
UNDERPAD 30X30 (UNDERPADS AND DIAPERS) ×2 IMPLANT

## 2018-06-19 NOTE — Op Note (Signed)
I assisted Surgeon(s) and Role:    * Leanora Cover, MD - Primary    Daryll Brod, MD - Assisting on the Procedure(s): LEFT SMALL FINGER OPEN REDUCTION INTERNAL FIXATION (ORIF) CARPOMETACARPAL FUSION on 06/19/2018.  I provided assistance on this case as follows: setup, a[pproach, debridement, reduction, stabilization,fusion and fixation of the bones and closure of the wound and application of the dressings and splint.  Electronically signed by: Wynonia Sours, MD Date: 06/19/2018 Time: 11:47 AM

## 2018-06-19 NOTE — Discharge Instructions (Addendum)

## 2018-06-19 NOTE — Transfer of Care (Signed)
Immediate Anesthesia Transfer of Care Note  Patient: Elizabeth Armstrong [facility]  Procedure(s) Performed: LEFT SMALL FINGER OPEN REDUCTION INTERNAL FIXATION (ORIF) CARPOMETACARPAL FUSION (Left Hand)  Patient Location: PACU  Anesthesia Type:General  Level of Consciousness: awake, alert  and oriented  Airway & Oxygen Therapy: Patient Spontanous Breathing and Patient connected to face mask oxygen  Post-op Assessment: Report given to RN and Post -op Vital signs reviewed and stable  Post vital signs: Reviewed and stable  Last Vitals:  Vitals Value Taken Time  BP    Temp    Pulse 86 06/19/2018 11:52 AM  Resp 16 06/19/2018 11:52 AM  SpO2 96 % 06/19/2018 11:52 AM  Vitals shown include unvalidated device data.  Last Pain:  Vitals:   06/19/18 0802  TempSrc: Oral  PainSc: 0-No pain         Complications: No apparent anesthesia complications

## 2018-06-19 NOTE — Anesthesia Postprocedure Evaluation (Signed)
Anesthesia Post Note  Patient: Kendrea Cerritos Pomeroy  Procedure(s) Performed: LEFT SMALL FINGER OPEN REDUCTION INTERNAL FIXATION (ORIF) CARPOMETACARPAL FUSION (Left Hand)     Patient location during evaluation: PACU Anesthesia Type: General Level of consciousness: awake and alert Pain management: pain level controlled Vital Signs Assessment: post-procedure vital signs reviewed and stable Respiratory status: spontaneous breathing, nonlabored ventilation and respiratory function stable Cardiovascular status: blood pressure returned to baseline and stable Postop Assessment: no apparent nausea or vomiting Anesthetic complications: no    Last Vitals:  Vitals:   06/19/18 1300 06/19/18 1315  BP: 127/72 122/77  Pulse: 66 68  Resp: 18 12  Temp:    SpO2: 100% 98%    Last Pain:  Vitals:   06/19/18 1315  TempSrc:   PainSc: Cylinder Caidan Hubbert

## 2018-06-19 NOTE — Anesthesia Preprocedure Evaluation (Signed)
Anesthesia Evaluation  Patient identified by MRN, date of birth, ID band Patient awake    Reviewed: Allergy & Precautions, NPO status , Patient's Chart, lab work & pertinent test results  Airway Mallampati: II  TM Distance: >3 FB Neck ROM: Full    Dental no notable dental hx.    Pulmonary neg pulmonary ROS,    Pulmonary exam normal breath sounds clear to auscultation       Cardiovascular hypertension, Pt. on medications negative cardio ROS Normal cardiovascular exam Rhythm:Regular Rate:Normal     Neuro/Psych negative neurological ROS  negative psych ROS   GI/Hepatic negative GI ROS, Neg liver ROS,   Endo/Other  negative endocrine ROS  Renal/GU negative Renal ROS  negative genitourinary   Musculoskeletal negative musculoskeletal ROS (+) Arthritis , Osteoarthritis,    Abdominal   Peds negative pediatric ROS (+)  Hematology negative hematology ROS (+)   Anesthesia Other Findings   Reproductive/Obstetrics negative OB ROS                             Anesthesia Physical Anesthesia Plan  ASA: II  Anesthesia Plan: General   Post-op Pain Management:    Induction: Intravenous  PONV Risk Score and Plan: 3 and Ondansetron, Dexamethasone and Midazolam  Airway Management Planned: LMA  Additional Equipment:   Intra-op Plan:   Post-operative Plan: Extubation in OR  Informed Consent: I have reviewed the patients History and Physical, chart, labs and discussed the procedure including the risks, benefits and alternatives for the proposed anesthesia with the patient or authorized representative who has indicated his/her understanding and acceptance.   Dental advisory given  Plan Discussed with: CRNA  Anesthesia Plan Comments:         Anesthesia Quick Evaluation

## 2018-06-19 NOTE — H&P (Signed)
  Elizabeth Armstrong is an 62 y.o. female.   Chief Complaint: left small finger CMC dislocation HPI: 62 yo female sustained left small finger CMC dislocation ~2 months ago.  Not initially noted on radiographs.  Followed up in office early July.  She wishes to have operative reduction and fixation with possible fusion [place] if necessary.  Allergies: No Known Allergies  Past Medical History:  Diagnosis Date  . HTN (hypertension)   . Hypercholesteremia   . Melanoma of skin ([place]) 08/25/2015   sees dermatologist at least twice per year, Dr. Denna Armstrong  . Osteoarthrosis, unspecified whether generalized or localized, involving lower leg 11/19/2014   of knees, s/p knee replacements bilat, sees [facility]  . Palpitations 08/25/2015  . Vaginal atrophy 08/25/2015    Past Surgical History:  Procedure Laterality Date  . APPENDECTOMY  2009  . CESAREAN SECTION    . JOINT REPLACEMENT Bilateral    knees  . KNEE SURGERY  2009   left  . WRIST SURGERY  2007   right    Family History: Family History  Problem Relation Age of Onset  . Heart disease Unknown     Social History:   reports that she has never smoked. She has never used smokeless tobacco. She reports that she drinks about 1.8 oz of alcohol per week. She reports that she does not use drugs.  Medications: Medications Prior to Admission  Medication Sig Dispense Refill  . BYSTOLIC 5 MG tablet TAKE 1 TABLET BY MOUTH EVERY DAY 30 tablet 5  . Cholecalciferol (VITAMIN D3 PO) Take 1,000 Units by mouth daily.    . hydrochlorothiazide (HYDRODIURIL) 25 MG tablet Take 1 tablet (25 mg total) by mouth daily. 90 tablet 3  . Magnesium 500 MG TABS Take by mouth daily.    . vitamin B-12 (CYANOCOBALAMIN) 1000 MCG tablet Take 1,000 mcg by mouth daily.      No results found for this or any previous visit (from the past 48 hour(s)).  No results found.   A comprehensive review of systems was negative.  Blood pressure (!) 150/91, pulse 81, temperature 98  F (36.7 C), temperature source Oral, resp. rate 16, height 5\' 4"  (1.626 m), weight 69.4 kg (153 lb), SpO2 100 %.  General appearance: alert, cooperative and appears stated age Head: Normocephalic, without obvious abnormality, atraumatic Neck: supple, symmetrical, trachea midline Cardio: regular rate and rhythm Resp: clear to auscultation bilaterally Extremities: Intact sensation and capillary refill all digits.  +epl/fpl/io.  No wounds.  Pulses: 2+ and symmetric Skin: Skin color, texture, turgor normal. No rashes or lesions Neurologic: Grossly normal Incision/Wound: none  Assessment/Plan Left small finger CMC dislocation.  Non operative and operative treatment options were discussed with the patient and patient wishes to proceed with operative treatment. Risks, benefits, and alternatives of surgery were discussed and the patient agrees with the plan of care.   Elizabeth Armstrong R 06/19/2018, 10:24 AM

## 2018-06-19 NOTE — Anesthesia Procedure Notes (Signed)
Procedure Name: LMA Insertion Date/Time: 06/19/2018 10:40 AM Performed by: Lieutenant Diego, CRNA Pre-anesthesia Checklist: Patient identified, Emergency Drugs available, Suction available and Patient being monitored Patient Re-evaluated:Patient Re-evaluated prior to induction Oxygen Delivery Method: Circle system utilized Preoxygenation: Pre-oxygenation with 100% oxygen Induction Type: IV induction Ventilation: Mask ventilation without difficulty LMA: LMA inserted LMA Size: 4.0 Number of attempts: 1 Placement Confirmation: positive ETCO2 and breath sounds checked- equal and bilateral Tube secured with: Tape Dental Injury: Teeth and Oropharynx as per pre-operative assessment

## 2018-06-19 NOTE — Op Note (Signed)
NAME: Elizabeth Armstrong [facility] MEDICAL RECORD NO: [id number] DATE OF BIRTH: [birth year] FACILITY[facility] LOCATION: [facility] PHYSICIAN: Tennis Must, MD   OPERATIVE REPORT   DATE OF PROCEDURE: 06/19/18    PREOPERATIVE DIAGNOSIS:   Left small finger CMC dislocation   POSTOPERATIVE DIAGNOSIS:   Left small finger CMC dislocation   PROCEDURE:   Left small finger CMC fusion   SURGEON:  Leanora Cover, M.D.   ASSISTANT: Daryll Brod, MD   ANESTHESIA:  General   INTRAVENOUS FLUIDS:  Per anesthesia flow sheet.   ESTIMATED BLOOD LOSS:  Minimal.   COMPLICATIONS:  None.   SPECIMENS:  none   TOURNIQUET TIME:    Total Tourniquet Time Documented: Upper Arm (Left) - 53 minutes Total: Upper Arm (Left) - 53 minutes    DISPOSITION:  Stable to PACU.   INDICATIONS: 62 year old female states she sustained an injury to her left hand proximal me 2 months ago.  She was seen in the emergency department where radiograph were taken.  A dislocation of the [facility] was not noted at that time.  She followed up in the office earlier this month with continued pain in the hand.  She was noted to have continued volar dislocation of the [facility] joint.  We discussed treatment with acceptance, reduction and fixation, versus fusion of the [facility] joint. Risks, benefits and alternatives of surgery were discussed including the risks of blood loss, infection, damage to nerves, vessels, tendons, ligaments, bone for surgery, need for additional surgery, complications with wound healing, continued pain, nonunion, malunion, stiffness.  She voiced understanding of these risks and elected to proceed.  OPERATIVE COURSE:  After being identified preoperatively by myself,  the patient and I agreed on the procedure and site of the procedure.  The surgical site was marked.  Surgical consent had been signed. She was given IV Ancef as preoperative antibiotic prophylaxis. She was transferred to the operating room and placed on the  operating table in supine position with the Left upper extremity on an arm board.  General anesthesia was induced by the anesthesiologist.  Left upper extremity was prepped and draped in normal sterile orthopedic fashion.  A surgical pause was performed between the surgeons, anesthesia, and operating room staff and all were in agreement as to the patient, procedure, and site of procedure.  Tourniquet at the proximal aspect of the extremity was inflated to 250 mmHg after exsanguination of the arm with an Esmarch bandage.    Attempted closed reduction was performed.  This was unsuccessful.  Incision was made at the dorsal ulnar side of the hand and carried in subtenons tissues by spreading technique.  The dorsal branch of the ulnar nerve was identified and protected.  The [facility] joint was located using the C arm.  Incision was made to the capsule.  The [facility] joint was visualized.  There is volar dislocation.  Attempts were made to reduce this under direct visualization.  The soft tissue structures surrounding the base of the metacarpal and on the hamate were freed using a freer elevator.  Care was taken to protect any volar neurovascular structures.  Reduction was not able to be achieved.  There was damage to the articular surface and what appeared to be a malunion of the volar lip of the hamate.  It was felt that a fusion of the [facility] joint would be appropriate to maintain as much stability and decreased pain as possible.  The rongeurs and curette were used to remove the articular  surface and subchondral bone from the base of the small finger metacarpal and from the articular side of the hamate at the small finger metacarpal.  This allowed easier reduction of the metacarpal into appropriate position.  Two 0.045 inch K wires were then used.  One was placed obliquely into the hamate and one across the small finger metacarpal base into the ring finger metacarpal base.  This was adequate to stabilize the fusion site.  C-arm was  used in AP lateral and oblique projections to ensure appropriate reduction position for the hardware which was the case.  Wound was irrigated with sterile saline.  The capsule was repaired as best possible using 4-0 chromic suture.  The skin was closed with 4-0 nylon in a horizontal mattress fashion.  Pins were bent and cut short.  The wounds and pin sites were dressed with sterile Xeroform 4 x 4's and wrapped with a Kerlix bandage.  Volar and dorsal slab splint including index long ring and small fingers was placed and wrapped with Kerlix and Ace bandage.  The tourniquet was deflated at 3 minutes.  Fingertips were pink with brisk capillary refill after deflation of tourniquet.  The operative  drapes were broken down.  The patient was awoken from anesthesia safely.  She was transferred back to the stretcher and taken to PACU in stable condition.  I will see her back in the office in 1 week for postoperative followup.  I will give her a prescription for Norco 5/325 1-2 tabs PO q6 hours prn pain, dispense # 30.   Tennis Must, MD Electronically signed, 06/19/18

## 2018-06-20 ENCOUNTER — Encounter (HOSPITAL_BASED_OUTPATIENT_CLINIC_OR_DEPARTMENT_OTHER): Payer: Self-pay | Admitting: Orthopedic Surgery

## 2018-06-23 ENCOUNTER — Ambulatory Visit: Payer: BLUE CROSS/BLUE SHIELD | Admitting: Family Medicine

## 2018-06-26 NOTE — Progress Notes (Signed)
HPI:  Using dictation device. Unfortunately this device frequently misinterprets words/phrases.  Elizabeth Armstrong is a pleasant 62 y.o. here for follow up. Chronic medical problems summarized below were reviewed for changes.  Reports is doing well for the most part, is really from left hand surgery.  She loves the sun.  Is tolerating the hydrochlorothiazide well.  But, she does not want to stop the Bystolic.  She does not remember ever having palpitations, may be rare.  Denies CP, SOB, DOE, treatment intolerance or new symptoms.  Osteoporosis, mild comp rx: -dexa 03/2017 with osteoporosis -she does not want to take medications -tx:Vit D3 1000 and is trying to get more consistent wt bearing exercise  HTN/Palpitations/Hx mild HLD: -chronic -meds: bystolic, used to see cardiologist with remote eval with normal echo and stress test per her report - wants to get off bystolic as is expensive, reports never had bad palpitations, maybe rare -now on hctz again as had liked it in the past for her BP  Vaginal Atrophy: -uses premarin 1x per month  B12 def: -started b12 supplement 2019  Hx Melanoma skin ca: -sees dermatologist at least 2x per year for this  OA knees and back: -sees [facility] Ar[facility]ella [facility] at [facility] per her report -s/p bilat knee replacement   ROS: See pertinent positives and negatives per HPI.  Past Medical History:  Diagnosis Date  . HTN (hypertension)   . Hypercholesteremia   . Melanoma of skin ([place]) 08/25/2015   sees dermatologist at least twice per year, Dr. Denna Haggard  . Osteoarthrosis, unspecified whether generalized or localized, involving lower leg 11/19/2014   of knees, s/p knee replacements bilat, sees [facility]  . Palpitations 08/25/2015  . Vaginal atrophy 08/25/2015    Past Surgical History:  Procedure Laterality Date  . APPENDECTOMY  2009  . CESAREAN SECTION    . JOINT REPLACEMENT Bilateral    knees  . KNEE SURGERY  2009   left  . OPEN REDUCTION INTERNAL  FIXATION (ORIF) METACARPAL Left 06/19/2018   Procedure: LEFT SMALL FINGER OPEN REDUCTION INTERNAL FIXATION (ORIF) CARPOMETACARPAL FUSION;  Surgeon: Leanora Cover, MD;  Location: [place];  Service: Orthopedics;  Laterality: Left;  . WRIST SURGERY  2007   right    Family History  Problem Relation Age of Onset  . Heart disease Unknown     SOCIAL HX: See HPI   Current Outpatient Medications:  .  Cholecalciferol (VITAMIN D3 PO), Take 1,000 Units by mouth daily., Disp: , Rfl:  .  HYDROcodone-acetaminophen (NORCO) 5-325 MG tablet, 1-2 tabs po q6 hours prn pain, Disp: 30 tablet, Rfl: 0 .  Magnesium 500 MG TABS, Take by mouth daily., Disp: , Rfl:  .  vitamin B-12 (CYANOCOBALAMIN) 1000 MCG tablet, Take 1,000 mcg by mouth daily., Disp: , Rfl:  .  amLODipine (NORVASC) 5 MG tablet, Take 1 tablet (5 mg total) by mouth daily., Disp: 90 tablet, Rfl: 3  EXAM:  Vitals:   06/30/18 0917  BP: 120/78  Pulse: 65  Temp: 97.9 F (36.6 C)    Body mass index is 26.42 kg/m.  GENERAL: vitals reviewed and listed above, alert, oriented, appears well hydrated and in no acute distress  HEENT: atraumatic, conjunttiva clear, no obvious abnormalities on inspection of external nose and ears  NECK: no obvious masses on inspection  LUNGS: clear to auscultation bilaterally, no wheezes, rales or rhonchi, good air movement  CV: HRRR, no peripheral edema  MS: moves all extremities without noticeable abnormality  PSYCH: pleasant  and cooperative, no obvious depression or anxiety  ASSESSMENT AND PLAN:  Discussed the following assessment and plan:  Essential hypertension  Melanoma of skin (HCC)  Palpitations  B12 deficiency - Plan: Vitamin B12  -She wishes to discontinue her beta-blocker secondary to cost issues, taper slowly.  It sounds like if she had any palpitations in the past, that they were mild.  She plans to monitor and if symptoms return, we will discuss with her insurance  company to see what beta-blocker she may be able to take. -Discussed other options for blood pressure medication and after recent studies, recommended hydrochlorothiazide may not be the best option for her given her history of skin cancer and that she loves the sun.  Advised good sun protection and she sees a dermatologist about this.  Do not want to increase her risk of burning and sun damage with the hydrochlorothiazide. -Continue Norvasc instead, Rx sent -She wants to check her B12, as she did not make it in for her lab check, orders placed to check today -Follow-up 1 to 2 months  Patient Instructions  BEFORE YOU LEAVE: -lab to check b12 -follow up: 1-2  Months  STOP the hydrochlorthiazide today  START the Norvasc 5mg  daily tomorrow  TAPER off of the bystolic 2.5 mg for 1-2 weeks, then half of this for 1 week if possible and/or stop  Continue the b12  We have ordered labs or studies at this visit. It can take up to 1-2 weeks for results and processing. IF results require follow up or explanation, we will call you with instructions. Clinically stable results will be released to your [facility]. If you have not heard from [place] or cannot find your results in [facility] in 2 weeks please contact our office at [phone].  If you are not yet signed up for [facility], please consider signing up.           Lucretia Kern, DO

## 2018-06-30 ENCOUNTER — Encounter: Payer: Self-pay | Admitting: Family Medicine

## 2018-06-30 ENCOUNTER — Ambulatory Visit (INDEPENDENT_AMBULATORY_CARE_PROVIDER_SITE_OTHER): Payer: BLUE CROSS/BLUE SHIELD | Admitting: Family Medicine

## 2018-06-30 ENCOUNTER — Other Ambulatory Visit: Payer: BLUE CROSS/BLUE SHIELD

## 2018-06-30 VITALS — BP 120/78 | HR 65 | Temp 97.9°F | Ht 64.0 in | Wt 153.9 lb

## 2018-06-30 DIAGNOSIS — E538 Deficiency of other specified B group vitamins: Secondary | ICD-10-CM

## 2018-06-30 DIAGNOSIS — I10 Essential (primary) hypertension: Secondary | ICD-10-CM

## 2018-06-30 DIAGNOSIS — C439 Malignant melanoma of skin, unspecified: Secondary | ICD-10-CM | POA: Diagnosis not present

## 2018-06-30 DIAGNOSIS — R002 Palpitations: Secondary | ICD-10-CM | POA: Diagnosis not present

## 2018-06-30 LAB — VITAMIN B12: Vitamin B-12: 426 pg/mL (ref 211–911)

## 2018-06-30 MED ORDER — AMLODIPINE BESYLATE 5 MG PO TABS: 5.0000 mg | ORAL_TABLET | Freq: Every day | ORAL | 3 refills | Status: DC

## 2018-06-30 NOTE — Patient Instructions (Signed)
BEFORE YOU LEAVE: -lab to check b12 -follow up: 1-2  Months  STOP the hydrochlorthiazide today  START the Norvasc 5mg  daily tomorrow  TAPER off of the bystolic 2.5 mg for 1-2 weeks, then half of this for 1 week if possible and/or stop  Continue the b12  We have ordered labs or studies at this visit. It can take up to 1-2 weeks for results and processing. IF results require follow up or explanation, we will call you with instructions. Clinically stable results will be released to your [facility]. If you have not heard from [place] or cannot find your results in [facility] in 2 weeks please contact our office at [phone].  If you are not yet signed up for [facility], please consider signing up.

## 2018-09-26 ENCOUNTER — Ambulatory Visit (INDEPENDENT_AMBULATORY_CARE_PROVIDER_SITE_OTHER): Payer: Managed Care, Other (non HMO) | Admitting: Family Medicine

## 2018-09-26 ENCOUNTER — Other Ambulatory Visit: Payer: Self-pay

## 2018-09-26 ENCOUNTER — Encounter: Payer: Self-pay | Admitting: Family Medicine

## 2018-09-26 VITALS — BP 120/78 | HR 81 | Temp 98.0°F | Ht 64.0 in | Wt 157.4 lb

## 2018-09-26 DIAGNOSIS — R309 Painful micturition, unspecified: Secondary | ICD-10-CM | POA: Diagnosis not present

## 2018-09-26 DIAGNOSIS — Z23 Encounter for immunization: Secondary | ICD-10-CM

## 2018-09-26 LAB — POCT URINALYSIS DIPSTICK
Bilirubin, UA: NEGATIVE
GLUCOSE UA: NEGATIVE
Ketones, UA: NEGATIVE
Nitrite, UA: NEGATIVE
PH UA: 6 (ref 5.0–8.0)
Protein, UA: NEGATIVE
RBC UA: POSITIVE
Spec Grav, UA: 1.01 (ref 1.010–1.025)
UROBILINOGEN UA: 0.2 U/dL

## 2018-09-26 MED ORDER — NITROFURANTOIN MONOHYD MACRO 100 MG PO CAPS: 100.0000 mg | ORAL_CAPSULE | Freq: Two times a day (BID) | ORAL | 0 refills | Status: DC

## 2018-09-26 NOTE — Progress Notes (Signed)
  Subjective:     Patient ID: Elizabeth Armstrong, female   DOB: May 02, 1956, 62 y.o.   MRN: [id number]  HPI Patient seen with some burning with urination for the past 5 days or so.  Some mild frequency.  No gross hematuria.  No flank pain.  No nausea or vomiting.  Denies any fevers or chills.  Denies past history of UTI.  No vaginal discharge.  No known drug allergies.  Past Medical History:  Diagnosis Date  . HTN (hypertension)   . Hypercholesteremia   . Melanoma of skin ([place]) 08/25/2015   sees dermatologist at least twice per year, Dr. Denna Haggard  . Osteoarthrosis, unspecified whether generalized or localized, involving lower leg 11/19/2014   of knees, s/p knee replacements bilat, sees [facility]  . Palpitations 08/25/2015  . Vaginal atrophy 08/25/2015   Past Surgical History:  Procedure Laterality Date  . APPENDECTOMY  2009  . CESAREAN SECTION    . JOINT REPLACEMENT Bilateral    knees  . KNEE SURGERY  2009   left  . OPEN REDUCTION INTERNAL FIXATION (ORIF) METACARPAL Left 06/19/2018   Procedure: LEFT SMALL FINGER OPEN REDUCTION INTERNAL FIXATION (ORIF) CARPOMETACARPAL FUSION;  Surgeon: Leanora Cover, MD;  Location: [place];  Service: Orthopedics;  Laterality: Left;  . WRIST SURGERY  2007   right    reports that she has never smoked. She has never used smokeless tobacco. She reports that she drinks about 3.0 standard drinks of alcohol per week. She reports that she does not use drugs. family history includes Heart disease in her unknown relative. No Known Allergies   Review of Systems  Constitutional: Negative for chills and fever.  Genitourinary: Positive for dysuria. Negative for flank pain and hematuria.       Objective:   Physical Exam  Constitutional: She appears well-developed and well-nourished.  Cardiovascular: Normal rate and regular rhythm.  Pulmonary/Chest: Effort normal and breath sounds normal.       Assessment:     Dysuria.  Suspect  uncomplicated cystitis.  Urine dipstick reveals 1+ leukocytes    Plan:     -Urine culture sent -Increase fluid intake -Start Macrobid 1 twice daily pending culture results -Follow-up promptly for any worsening or persistent symptoms  Eulas Post MD [facility]

## 2018-09-26 NOTE — Patient Instructions (Signed)

## 2018-09-26 NOTE — Addendum Note (Signed)
Addended by: Elmer Picker on: 09/26/2018 12:29 PM   Modules accepted: Orders

## 2018-09-26 NOTE — Addendum Note (Signed)
Addended by: Elmer Picker on: 09/26/2018 12:31 PM   Modules accepted: Orders

## 2018-09-28 LAB — URINE CULTURE
MICRO NUMBER: 91286581
SPECIMEN QUALITY:: ADEQUATE

## 2018-09-29 ENCOUNTER — Encounter: Payer: Self-pay | Admitting: Family Medicine

## 2018-12-27 ENCOUNTER — Ambulatory Visit (INDEPENDENT_AMBULATORY_CARE_PROVIDER_SITE_OTHER): Payer: Managed Care, Other (non HMO) | Admitting: Family Medicine

## 2018-12-27 ENCOUNTER — Encounter: Payer: Self-pay | Admitting: Family Medicine

## 2018-12-27 VITALS — BP 120/68 | HR 70 | Temp 97.8°F | Ht 64.0 in | Wt 159.0 lb

## 2018-12-27 DIAGNOSIS — L089 Local infection of the skin and subcutaneous tissue, unspecified: Secondary | ICD-10-CM | POA: Diagnosis not present

## 2018-12-27 MED ORDER — CEPHALEXIN 500 MG PO CAPS: 500.0000 mg | ORAL_CAPSULE | Freq: Four times a day (QID) | ORAL | 0 refills | Status: DC

## 2018-12-27 NOTE — Progress Notes (Signed)
Subjective:    Patient ID: Elizabeth Armstrong, female    DOB: 1956/06/02, 63 y.o.   MRN: [id number]  HPI   Patient presents to clinic complaining of swollen area and tenderness on left middle finger.  Patient believes it is possible her cuticle was cut too deep after her most recent manicure.  Patient states she has been trying to see if she can make area feel better at home by using topical antibiotic ointment and Band-Aid, but it is not improving.  Patient states there has been some puslike drainage that has come from the area as well as some blood.  Denies any fever or chills.  Denies any spreading redness of skin onto hand or up arm.   Patient Active Problem List   Diagnosis Date Noted  . Osteoporosis 03/12/2017  . Melanoma of skin ([place]) 08/25/2015  . Palpitations 08/25/2015  . Vaginal atrophy 08/25/2015  . Essential hypertension 11/19/2014  . Pure hypercholesterolemia 11/19/2014  . Osteoarthrosis, unspecified whether generalized or localized, involving lower leg 11/19/2014   Social History   Tobacco Use  . Smoking status: Never Smoker  . Smokeless tobacco: Never Used  Substance Use Topics  . Alcohol use: Yes    Alcohol/week: 3.0 standard drinks    Types: 3 Glasses of wine per week    Comment: social   Review of Systems  Constitutional: Negative for chills, fatigue and fever.  HENT: Negative for congestion, ear pain, sinus pain and sore throat.   Eyes: Negative.   Respiratory: Negative for cough, shortness of breath and wheezing.   Cardiovascular: Negative for chest pain, palpitations and leg swelling.  Gastrointestinal: Negative for abdominal pain, diarrhea, nausea and vomiting.  Genitourinary: Negative for dysuria, frequency and urgency.  Musculoskeletal: Negative for arthralgias and myalgias.  Skin: +tenderness of skin surrounding left middle finger nail.  Neurological: Negative for syncope, light-headedness and headaches.  Psychiatric/Behavioral: The patient is  not nervous/anxious.       Objective:   Physical Exam Vitals signs and nursing note reviewed.  Constitutional:      General: She is not in acute distress.    Appearance: She is not toxic-appearing.  HENT:     Head: Normocephalic and atraumatic.  Cardiovascular:     Rate and Rhythm: Normal rate and regular rhythm.  Pulmonary:     Effort: Pulmonary effort is normal. No respiratory distress.     Breath sounds: Normal breath sounds.  Musculoskeletal:       Hands:     Comments: Location of skin erythema and tenderness indicated by red line on diagram.  Small overgrown area of skin present.  Area cleansed with alcohol swab.  Verbal consent obtained from patient to allow me to cut off this overgrown skin.  Sterile capital cultures obtained from supply are normal, overgrown skin cut off easily with 1 cutting motion.  Area again cleansed with alcohol swab, bacitracin applied and area was wrapped with Band-Aid.  Skin:    General: Skin is warm and dry.     Findings: Erythema (left middle finger skin surrounding nail (see diagram)) present.  Neurological:     Mental Status: She is alert and oriented to person, place, and time.  Psychiatric:        Mood and Affect: Mood normal.        Behavior: Behavior normal.    Vitals:   12/27/18 1009  BP: 120/68  Pulse: 70  Temp: 97.8 F (36.6 C)  SpO2: 99%  Assessment & Plan:   Skin infection- patient tolerated skin removal procedure very well.  Advised to wash area every day with mild soap and rinse with water.  Advised to apply thin layer of bacitracin ointment and cover with Band-Aid.  After 5 days, advised patient can leave area open to air.  She also will take a course of Keflex to treat skin infection.  Patient will keep regularly scheduled follow up with PCP as planned. She will return to clinic sooner if current symptoms persist or worsen, or if new issues arise.

## 2018-12-29 ENCOUNTER — Ambulatory Visit: Payer: Managed Care, Other (non HMO) | Admitting: Family Medicine
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
## 2019-04-17 ENCOUNTER — Other Ambulatory Visit: Payer: Self-pay

## 2019-04-17 ENCOUNTER — Ambulatory Visit (INDEPENDENT_AMBULATORY_CARE_PROVIDER_SITE_OTHER): Payer: Managed Care, Other (non HMO) | Admitting: Family Medicine

## 2019-04-17 ENCOUNTER — Encounter: Payer: Self-pay | Admitting: Family Medicine

## 2019-04-17 DIAGNOSIS — M810 Age-related osteoporosis without current pathological fracture: Secondary | ICD-10-CM

## 2019-04-17 DIAGNOSIS — N3000 Acute cystitis without hematuria: Secondary | ICD-10-CM | POA: Diagnosis not present

## 2019-04-17 DIAGNOSIS — N952 Postmenopausal atrophic vaginitis: Secondary | ICD-10-CM | POA: Diagnosis not present

## 2019-04-17 DIAGNOSIS — I10 Essential (primary) hypertension: Secondary | ICD-10-CM | POA: Diagnosis not present

## 2019-04-17 DIAGNOSIS — E538 Deficiency of other specified B group vitamins: Secondary | ICD-10-CM

## 2019-04-17 DIAGNOSIS — E7800 Pure hypercholesterolemia, unspecified: Secondary | ICD-10-CM

## 2019-04-17 HISTORY — DX: Deficiency of other specified B group vitamins: E53.8

## 2019-04-17 MED ORDER — ESTROGENS, CONJUGATED 0.625 MG/GM VA CREA: TOPICAL_CREAM | VAGINAL | 5 refills | Status: DC

## 2019-04-17 MED ORDER — CEPHALEXIN 500 MG PO CAPS: 500.0000 mg | ORAL_CAPSULE | Freq: Two times a day (BID) | ORAL | 0 refills | Status: DC

## 2019-04-17 NOTE — Progress Notes (Signed)
Virtual Visit via Video Note  I connected with Elizabeth Armstrong  on 04/17/19 at  4:15 PM EDT by a video enabled telemedicine application and verified that I am speaking with the correct person using two identifiers.  Location patient: home Location provider:work or home office Persons participating in the virtual visit: patient, provider  I discussed the limitations of evaluation and management by telemedicine and the availability of in person appointments. The patient expressed understanding and agreed to proceed.   Elizabeth Armstrong DOB: Jul 10, 1956 Encounter date: 04/17/2019  This is a 63 y.o. female who presents with Chief Complaint  Patient presents with  . Dysuria    patient complains of pain with urination x3 weeks, initially she used AZO and again last weekend with recurrent symptoms    History of present illness:  HPI  Sx have not been constant. Keeps thinking she is over things and then sx return. Has been awhile since infection. Usually AZO resolves discomfort for her.   No fevers, chills. Not felt bad. Improves with water intake. No blood in urine. Just irritated with urination. No abd pain, but there is pressure. No back pain outside her normal.   No definite trigger for infection.   Was using premarin cream once monthly to help with vaginal atrophy. Has run out of this. Does have dryness in this area and interest in restarting.    No Known Allergies Current Meds  Medication Sig  . amLODipine (NORVASC) 5 MG tablet Take 1 tablet (5 mg total) by mouth daily.  . Cholecalciferol (VITAMIN D3 PO) Take 1,000 Units by mouth daily.  . Magnesium 500 MG TABS Take by mouth daily.  . vitamin B-12 (CYANOCOBALAMIN) 1000 MCG tablet Take 1,000 mcg by mouth daily.    Review of Systems  Constitutional: Negative for chills, fatigue and fever.  Respiratory: Negative for cough, chest tightness, shortness of breath and wheezing.   Cardiovascular: Negative for chest pain,  palpitations and leg swelling.  Genitourinary: Positive for dysuria and frequency. Negative for difficulty urinating, flank pain, hematuria and urgency.    Objective:  There were no vitals taken for this visit.      BP Readings from Last 3 Encounters:  12/27/18 120/68  09/26/18 120/78  06/30/18 120/78   Wt Readings from Last 3 Encounters:  12/27/18 159 lb (72.1 kg)  09/26/18 157 lb 6.4 oz (71.4 kg)  06/30/18 153 lb 14.4 oz (69.8 kg)    EXAM:  GENERAL: alert, oriented, appears well and in no acute distress  HEENT: atraumatic, conjunctiva clear, no obvious abnormalities on inspection of external nose and ears  NECK: normal movements of the head and neck  LUNGS: on inspection no signs of respiratory distress, breathing rate appears normal, no obvious gross SOB, gasping or wheezing  CV: no obvious cyanosis  MS: moves all visible extremities without noticeable abnormality  PSYCH/NEURO: pleasant and cooperative, no obvious depression or anxiety, speech and thought processing grossly intact   Assessment/Plan  1. Acute cystitis without hematuria Keflex as directed (has tolerated well in past). If not noting significant improvement in next few days-resolution then let me know and we will switch med/get culture. Discussed potential causes for cystitis (esp since recent sx in October) including vaginal atrophy. Discussed hydrating with water, frequent bladder emptying to help prevent infection.  2. B12 deficiency Taking supplement. Check levels. - Vitamin B12; Future  3. Vaginal atrophy Restart monthly premarin - was helping with dryness and may help with UTI prevention. Will discuss further  at upcoming appointment.  4. Essential hypertension On amlodipine. - CBC with Differential/Platelet; Future - Comprehensive metabolic panel; Future  5. Osteoporosis, unspecified osteoporosis type, unspecified pathological fracture presence - VITAMIN D 25 Hydroxy (Vit-D Deficiency,  Fractures); Future  6. Pure hypercholesterolemia - Lipid panel; Future - TSH; Future  Return for physical exam after bloodwork.    I discussed the assessment and treatment plan with the patient. The patient was provided an opportunity to ask questions and all were answered. The patient agreed with the plan and demonstrated an understanding of the instructions.   The patient was advised to call back or seek an in-person evaluation if the symptoms worsen or if the condition fails to improve as anticipated.  I provided 25 minutes of non-face-to-face time during this encounter.   Elizabeth Rough, MD

## 2019-04-22 LAB — HM MAMMOGRAPHY

## 2019-06-22 ENCOUNTER — Other Ambulatory Visit: Payer: Self-pay | Admitting: Family Medicine

## 2019-06-26 ENCOUNTER — Other Ambulatory Visit (INDEPENDENT_AMBULATORY_CARE_PROVIDER_SITE_OTHER): Payer: Managed Care, Other (non HMO)

## 2019-06-26 ENCOUNTER — Other Ambulatory Visit: Payer: Self-pay

## 2019-06-26 DIAGNOSIS — E538 Deficiency of other specified B group vitamins: Secondary | ICD-10-CM | POA: Diagnosis not present

## 2019-06-26 DIAGNOSIS — I10 Essential (primary) hypertension: Secondary | ICD-10-CM | POA: Diagnosis not present

## 2019-06-26 DIAGNOSIS — M810 Age-related osteoporosis without current pathological fracture: Secondary | ICD-10-CM

## 2019-06-26 DIAGNOSIS — E7800 Pure hypercholesterolemia, unspecified: Secondary | ICD-10-CM

## 2019-06-26 LAB — CBC WITH DIFFERENTIAL/PLATELET
Basophils Absolute: 0 10*3/uL (ref 0.0–0.1)
Basophils Relative: 0.9 % (ref 0.0–3.0)
Eosinophils Absolute: 0.1 10*3/uL (ref 0.0–0.7)
Eosinophils Relative: 2.8 % (ref 0.0–5.0)
HCT: 40.8 % (ref 36.0–46.0)
Hemoglobin: 13.7 g/dL (ref 12.0–15.0)
Lymphocytes Relative: 31.2 % (ref 12.0–46.0)
Lymphs Abs: 1.4 10*3/uL (ref 0.7–4.0)
MCHC: 33.5 g/dL (ref 30.0–36.0)
MCV: 100.7 fl — ABNORMAL HIGH (ref 78.0–100.0)
Monocytes Absolute: 0.5 10*3/uL (ref 0.1–1.0)
Monocytes Relative: 9.9 % (ref 3.0–12.0)
Neutro Abs: 2.5 10*3/uL (ref 1.4–7.7)
Neutrophils Relative %: 55.2 % (ref 43.0–77.0)
Platelets: 219 10*3/uL (ref 150.0–400.0)
RBC: 4.05 Mil/uL (ref 3.87–5.11)
RDW: 13.7 % (ref 11.5–15.5)
WBC: 4.6 10*3/uL (ref 4.0–10.5)

## 2019-06-26 LAB — COMPREHENSIVE METABOLIC PANEL
ALT: 13 U/L (ref 0–35)
AST: 14 U/L (ref 0–37)
Albumin: 4.3 g/dL (ref 3.5–5.2)
Alkaline Phosphatase: 84 U/L (ref 39–117)
BUN: 20 mg/dL (ref 6–23)
CO2: 26 mEq/L (ref 19–32)
Calcium: 9.6 mg/dL (ref 8.4–10.5)
Chloride: 105 mEq/L (ref 96–112)
Creatinine, Ser: 0.7 mg/dL (ref 0.40–1.20)
GFR: 83.93 mL/min (ref >60.00)
Glucose, Bld: 91 mg/dL (ref 70–99)
Potassium: 4.4 mEq/L (ref 3.5–5.1)
Sodium: 140 mEq/L (ref 135–145)
Total Bilirubin: 0.6 mg/dL (ref 0.2–1.2)
Total Protein: 6.2 g/dL (ref 6.0–8.3)

## 2019-06-26 LAB — LIPID PANEL
Cholesterol: 211 mg/dL — ABNORMAL HIGH (ref 0–200)
HDL: 91 mg/dL (ref >39.00)
LDL Cholesterol: 107 mg/dL — ABNORMAL HIGH (ref 0–99)
NonHDL: 120.09
Total CHOL/HDL Ratio: 2
Triglycerides: 66 mg/dL (ref 0.0–149.0)
VLDL: 13.2 mg/dL (ref 0.0–40.0)

## 2019-06-26 LAB — TSH: TSH: 1.31 u[IU]/mL (ref 0.35–4.50)

## 2019-06-26 LAB — VITAMIN D 25 HYDROXY (VIT D DEFICIENCY, FRACTURES): VITD: 44.27 ng/mL (ref 30.00–100.00)

## 2019-06-26 LAB — VITAMIN B12: Vitamin B-12: 543 pg/mL (ref 211–911)

## 2019-06-29 ENCOUNTER — Encounter: Payer: Self-pay | Admitting: Family Medicine

## 2019-06-29 MED ORDER — AMLODIPINE BESYLATE 5 MG PO TABS: 5.0000 mg | ORAL_TABLET | Freq: Every day | ORAL | 0 refills | Status: DC

## 2019-07-03 ENCOUNTER — Encounter: Payer: Self-pay | Admitting: Family Medicine

## 2019-07-03 ENCOUNTER — Other Ambulatory Visit: Payer: Self-pay

## 2019-07-03 ENCOUNTER — Ambulatory Visit (INDEPENDENT_AMBULATORY_CARE_PROVIDER_SITE_OTHER): Payer: Managed Care, Other (non HMO) | Admitting: Family Medicine

## 2019-07-03 VITALS — BP 140/90 | HR 75 | Temp 96.7°F | Ht 64.5 in | Wt 160.9 lb

## 2019-07-03 DIAGNOSIS — Z0000 Encounter for general adult medical examination without abnormal findings: Secondary | ICD-10-CM

## 2019-07-03 DIAGNOSIS — I10 Essential (primary) hypertension: Secondary | ICD-10-CM

## 2019-07-03 DIAGNOSIS — E538 Deficiency of other specified B group vitamins: Secondary | ICD-10-CM

## 2019-07-03 DIAGNOSIS — Z23 Encounter for immunization: Secondary | ICD-10-CM

## 2019-07-03 DIAGNOSIS — C439 Malignant melanoma of skin, unspecified: Secondary | ICD-10-CM

## 2019-07-03 DIAGNOSIS — E7800 Pure hypercholesterolemia, unspecified: Secondary | ICD-10-CM

## 2019-07-03 DIAGNOSIS — M1990 Unspecified osteoarthritis, unspecified site: Secondary | ICD-10-CM

## 2019-07-03 MED ORDER — MELOXICAM 7.5 MG PO TABS: 7.5000 mg | ORAL_TABLET | Freq: Every day | ORAL | 0 refills | Status: DC

## 2019-07-03 NOTE — Patient Instructions (Addendum)
Consider voltaren gel. Let me know if this is not working for joint pain!  Keep up with healthy and active lifestyle.

## 2019-07-03 NOTE — Progress Notes (Signed)
Elizabeth Armstrong Brian DOB: [birth year] Encounter date: 07/03/2019  This is a 63 y.o. female who presents for complete physical   History of present illness/Additional concerns: Osteoporosis: dexa 03/2017; does not want medications. Vitamin D, weight bearing exercise HTN: amlodipine; doesn't check bp anymore. Sometimes when rushing around getting to appointment - it could be elevated. Does have home cuff. Did stop bystolic about a year ago. Hasn't had any palpitations.  Vaginal atrophy: premarin B12 def: supplementation since 2019 Hx of melanoma: sees derm twice yearly Osteoarthritis knees/back; s/p bilat knee replacement: followed at [facility] for knee replacements.  PNA vaccination: will complete today last mammogram  April or may 2020. We did not get copied on this.  Colonoscopy due 04/2021 (3 year follow up)  No specific concerns today except back pain. Has some inserts in shoes down several years ago and trying to be careful with shoe choice, etc. Just nagging back ache playing pickle ball, tennis, golf. Takes tylenol when needed; occasional advil pm. Took celebrex after knee surgeries, but didn't take daily. Tylenol doesn't really help.   Tries to eat right.   Does the premarin about once a week.   Past Medical History:  Diagnosis Date  . HTN (hypertension)   . Hypercholesteremia   . Melanoma of skin ([place]) 08/25/2015   sees dermatologist at least twice per year, Dr. Denna Haggard  . Osteoarthrosis, unspecified whether generalized or localized, involving lower leg 11/19/2014   of knees, s/p knee replacements bilat, sees [facility]  . Palpitations 08/25/2015  . Vaginal atrophy 08/25/2015   Past Surgical History:  Procedure Laterality Date  . APPENDECTOMY  2009  . CESAREAN SECTION    . JOINT REPLACEMENT Bilateral    knees  . KNEE SURGERY  2009   left  . OPEN REDUCTION INTERNAL FIXATION (ORIF) METACARPAL Left 06/19/2018   Procedure: LEFT SMALL FINGER OPEN REDUCTION INTERNAL FIXATION (ORIF)  CARPOMETACARPAL FUSION;  Surgeon: Leanora Cover, MD;  Location: [place];  Service: Orthopedics;  Laterality: Left;  . WRIST SURGERY  2007   right   No Known Allergies Current Meds  Medication Sig  . amLODipine (NORVASC) 5 MG tablet Take 1 tablet (5 mg total) by mouth daily.  . Cholecalciferol (VITAMIN D3 PO) Take 1,000 Units by mouth daily.  Marland Kitchen conjugated estrogens (PREMARIN) vaginal cream INSERT 1 APPLICATORFUL VAGINALLY EVERY 15- 30 (THIRTY) DAYS.  . Magnesium 500 MG TABS Take by mouth daily.  . vitamin B-12 (CYANOCOBALAMIN) 1000 MCG tablet Take 1,000 mcg by mouth daily.   Social History   Tobacco Use  . Smoking status: Never Smoker  . Smokeless tobacco: Never Used  Substance Use Topics  . Alcohol use: Yes    Alcohol/week: 3.0 standard drinks    Types: 3 Glasses of wine per week    Comment: social   Family History  Problem Relation Age of Onset  . Heart disease Unknown      Review of Systems  Constitutional: Negative for activity change, appetite change, chills, fatigue, fever and unexpected weight change.  HENT: Negative for congestion, ear pain, hearing loss, sinus pressure, sinus pain, sore throat and trouble swallowing.   Eyes: Negative for pain and visual disturbance.  Respiratory: Negative for cough, chest tightness, shortness of breath and wheezing.   Cardiovascular: Negative for chest pain, palpitations and leg swelling.  Gastrointestinal: Negative for abdominal pain, blood in stool, constipation, diarrhea, nausea and vomiting.  Genitourinary: Negative for difficulty urinating and menstrual problem.  Musculoskeletal: Negative for arthralgias and back  pain.  Skin: Negative for rash.  Neurological: Negative for dizziness, weakness, numbness and headaches.  Hematological: Negative for adenopathy. Does not bruise/bleed easily.  Psychiatric/Behavioral: Negative for sleep disturbance and suicidal ideas. The patient is not nervous/anxious.     CBC:   Lab Results  Component Value Date   WBC 4.6 06/26/2019   HGB 13.7 06/26/2019   HCT 40.8 06/26/2019   MCHC 33.5 06/26/2019   RDW 13.7 06/26/2019   PLT 219.0 06/26/2019   CMP: Lab Results  Component Value Date   NA 140 06/26/2019   K 4.4 06/26/2019   CL 105 06/26/2019   CO2 26 06/26/2019   ANIONGAP 10 06/16/2018   GLUCOSE 91 06/26/2019   BUN 20 06/26/2019   CREATININE 0.70 06/26/2019   GFRAA >60 06/16/2018   CALCIUM 9.6 06/26/2019   PROT 6.2 06/26/2019   BILITOT 0.6 06/26/2019   ALKPHOS 84 06/26/2019   ALT 13 06/26/2019   AST 14 06/26/2019   LIPID: Lab Results  Component Value Date   CHOL 211 (H) 06/26/2019   TRIG 66.0 06/26/2019   HDL 91.00 06/26/2019   LDLCALC 107 (H) 06/26/2019    Objective:  BP 140/90 (BP Location: Left Arm, Patient Position: Sitting, Cuff Size: Normal)   Pulse 75   Temp (!) 96.7 F (35.9 C) (Temporal)   Ht 5' 4.5" (1.638 m)   Wt 160 lb 14.4 oz (73 kg)   SpO2 98%   BMI 27.19 kg/m   Weight: 160 lb 14.4 oz (73 kg)   BP Readings from Last 3 Encounters:  07/03/19 140/90  12/27/18 120/68  09/26/18 120/78   Wt Readings from Last 3 Encounters:  07/03/19 160 lb 14.4 oz (73 kg)  12/27/18 159 lb (72.1 kg)  09/26/18 157 lb 6.4 oz (71.4 kg)    Physical Exam Constitutional:      General: She is not in acute distress.    Appearance: She is well-developed.  HENT:     Head: Normocephalic and atraumatic.     Right Ear: External ear normal.     Left Ear: External ear normal.     Mouth/Throat:     Pharynx: No oropharyngeal exudate.  Eyes:     Conjunctiva/sclera: Conjunctivae normal.     Pupils: Pupils are equal, round, and reactive to light.  Neck:     Musculoskeletal: Normal range of motion and neck supple.     Thyroid: No thyromegaly.  Cardiovascular:     Rate and Rhythm: Normal rate and regular rhythm.     Heart sounds: Normal heart sounds. No murmur. No friction rub. No gallop.   Pulmonary:     Effort: Pulmonary effort is normal.      Breath sounds: Normal breath sounds.  Abdominal:     General: Bowel sounds are normal. There is no distension.     Palpations: Abdomen is soft. There is no mass.     Tenderness: There is no abdominal tenderness. There is no guarding.     Hernia: No hernia is present.  Musculoskeletal: Normal range of motion.        General: No tenderness or deformity.  Lymphadenopathy:     Cervical: No cervical adenopathy.  Skin:    General: Skin is warm and dry.     Findings: No rash.  Neurological:     Mental Status: She is alert and oriented to person, place, and time.     Deep Tendon Reflexes: Reflexes normal.     Reflex Scores:  Tricep reflexes are 2+ on the right side and 2+ on the left side.      Bicep reflexes are 2+ on the right side and 2+ on the left side.      Brachioradialis reflexes are 2+ on the right side and 2+ on the left side.      Patellar reflexes are 2+ on the right side and 2+ on the left side. Psychiatric:        Speech: Speech normal.        Behavior: Behavior normal.        Thought Content: Thought content normal.     Assessment/Plan: Health Maintenance Due  Topic Date Due  . INFLUENZA VACCINE  07/04/2019   Health Maintenance reviewed.  1. Preventative health care Keep up with healthy eating and regular exercise.  2. Essential hypertension Blood pressure is elevated today on check in the office.  Recheck was slightly higher.  Continue current medications, but I have asked her to check blood pressures at home and report back to me in 2 weeks time.  Previous numbers have been quite good.  3. Pure hypercholesterolemia Diet controlled.  She has been doing a whole 30 diet for years and this is worked well for her.  4. Osteoarthritis, unspecified osteoarthritis type, unspecified site She has enough discomfort from joints and back that it does disrupt activity and sleep to some degree.  She does get some relief with Advil, we are going to try meloxicam 7.5 mg  in the evening to see if this gives her some additional relief throughout the night and possibly even day.  We also discussed trying topical Voltaren as this would be preferred with less GI side effects.  She is aware of risk of anti-inflammatories. - meloxicam (MOBIC) 7.5 MG tablet; Take 1 tablet (7.5 mg total) by mouth daily.  Dispense: 30 tablet; Refill: 0  5. Melanoma of skin ([place]) Follows with dermatology regularly.  6. B12 deficiency Continue with B12 supplementation.  7. Need BH pneumococcal vaccination Completed in office today. - Pneumococcal conjugate vaccine 13-valent IM  Return please report blood pressures back to me from home in 2 weeks.  Micheline Rough, MD

## 2019-07-16 ENCOUNTER — Encounter: Payer: Self-pay | Admitting: Family Medicine

## 2019-07-20 ENCOUNTER — Other Ambulatory Visit: Payer: Self-pay | Admitting: Family Medicine

## 2019-07-20 ENCOUNTER — Encounter: Payer: Self-pay | Admitting: Family Medicine

## 2019-07-20 MED ORDER — NEBIVOLOL HCL 2.5 MG PO TABS: 2.5000 mg | ORAL_TABLET | Freq: Every day | ORAL | 1 refills | Status: DC

## 2019-07-22 ENCOUNTER — Other Ambulatory Visit: Payer: Self-pay | Admitting: Family Medicine

## 2019-07-23 ENCOUNTER — Other Ambulatory Visit: Payer: Self-pay | Admitting: Family Medicine

## 2019-07-23 DIAGNOSIS — M1990 Unspecified osteoarthritis, unspecified site: Secondary | ICD-10-CM

## 2019-07-24 ENCOUNTER — Other Ambulatory Visit: Payer: Self-pay | Admitting: Family Medicine

## 2019-07-24 MED ORDER — AMLODIPINE BESYLATE 5 MG PO TABS: 7.5000 mg | ORAL_TABLET | Freq: Every day | ORAL | 1 refills | Status: DC

## 2019-08-11 ENCOUNTER — Encounter: Payer: Self-pay | Admitting: Family Medicine

## 2019-08-12 NOTE — Telephone Encounter (Signed)
Needs visit to discuss palpitations. Insurance is running out soon.   Would be ok to have virtual with HK if patient in agreement to help with determining next step in plan/follow up.   If she is scheduled for follow up with me I would like in office for EKG but I think sooner appointment is preferred due to insurance issue and either Dr. Maudie Mercury or myself could set up needed followup.

## 2019-08-13 ENCOUNTER — Encounter: Payer: Self-pay | Admitting: Family Medicine

## 2019-08-13 ENCOUNTER — Ambulatory Visit (INDEPENDENT_AMBULATORY_CARE_PROVIDER_SITE_OTHER): Payer: Managed Care, Other (non HMO) | Admitting: *Deleted

## 2019-08-13 ENCOUNTER — Telehealth (INDEPENDENT_AMBULATORY_CARE_PROVIDER_SITE_OTHER): Payer: Managed Care, Other (non HMO) | Admitting: Family Medicine

## 2019-08-13 ENCOUNTER — Other Ambulatory Visit: Payer: Self-pay

## 2019-08-13 DIAGNOSIS — R000 Tachycardia, unspecified: Secondary | ICD-10-CM | POA: Diagnosis not present

## 2019-08-13 DIAGNOSIS — I10 Essential (primary) hypertension: Secondary | ICD-10-CM

## 2019-08-13 DIAGNOSIS — R002 Palpitations: Secondary | ICD-10-CM

## 2019-08-13 NOTE — Patient Instructions (Signed)
-  We placed a referral for you as discussed to the cardiologist. Please call if you are not contacted regarding this appointment in the next 2 days.   -I sent a message to my nurse to schedule and EKG at our office. Please call if you have not heard form our office today.  -please seek emergency care immediately if any sustained symptoms, chest pain, difficulty breathing, new symptoms or symptoms are worsening.

## 2019-08-13 NOTE — Progress Notes (Signed)
EKG performed during nurse visit per Dr Maudie Mercury and message sent once completed.

## 2019-08-13 NOTE — Progress Notes (Signed)
Virtual Visit via Video Note  I connected with Elizabeth Armstrong  on 08/13/19 at 10:00 AM EDT by a video enabled telemedicine application and verified that I am speaking with the correct person using two identifiers.  Location patient: home Location provider:work or home office Persons participating in the virtual visit: patient, provider  I discussed the limitations of evaluation and management by telemedicine and the availability of in person appointments. The patient expressed understanding and agreed to proceed.   HPI:  Acute visit for palpitations: -has a history of palpitations remotely and used to be on bystolic -about a year ago she reports she had stopped the bystolic as insurance was no longer paying for it, was using norvasc for BP and was doing well -she has had a lot of stress in her life with life changes and changes at work and started noticing palpitations and her heart racing over the last 4-6 weeks with progressive worsening -BP was wore recently and PCP increased the Norvasc to 7.5mg  -BP recently has been around 130s/84 the last few days -she notices the palpitations more when at rest, very brief, but can last for a few minutes and she feels heart is racing, occ mild chest tightness with this -denies CP, SOB, dizziness, syncope, exertional symptoms, swelling -she also started meloxicam recently, but the palpitation had started before that she thinks -she had a workup with cardiology in 2009 for the palpitations and the cardiologist told her she no longer needed to see them since the bystolic was working. Reports she had fairly severe symptoms at the times.  ROS: See pertinent positives and negatives per HPI.  Past Medical History:  Diagnosis Date  . HTN (hypertension)   . Hypercholesteremia   . Melanoma of skin ([place]) 08/25/2015   sees dermatologist at least twice per year, Dr. Denna Armstrong  . Osteoarthrosis, unspecified whether generalized or localized, involving lower leg 11/19/2014   of knees, s/p knee replacements bilat, sees [facility]  . Palpitations 08/25/2015  . Vaginal atrophy 08/25/2015    Past Surgical History:  Procedure Laterality Date  . APPENDECTOMY  2009  . CESAREAN SECTION    . JOINT REPLACEMENT Bilateral    knees  . KNEE SURGERY  2009   left  . OPEN REDUCTION INTERNAL FIXATION (ORIF) METACARPAL Left 06/19/2018   Procedure: LEFT SMALL FINGER OPEN REDUCTION INTERNAL FIXATION (ORIF) CARPOMETACARPAL FUSION;  Surgeon: Elizabeth Cover, MD;  Location: [place];  Service: Orthopedics;  Laterality: Left;  . WRIST SURGERY  2007   right    Family History  Problem Relation Age of Onset  . Heart disease Unknown     SOCIAL HX: see hpi   Current Outpatient Medications:  .  amLODipine (NORVASC) 5 MG tablet, Take 1.5 tablets (7.5 mg total) by mouth daily., Disp: 135 tablet, Rfl: 1 .  Cholecalciferol (VITAMIN D3 PO), Take 1,000 Units by mouth daily., Disp: , Rfl:  .  conjugated estrogens (PREMARIN) vaginal cream, INSERT 1 APPLICATORFUL VAGINALLY EVERY 15- 30 (THIRTY) DAYS., Disp: 30 g, Rfl: 5 .  Magnesium 500 MG TABS, Take by mouth daily., Disp: , Rfl:  .  meloxicam (MOBIC) 7.5 MG tablet, Take 1 tablet (7.5 mg total) by mouth daily as needed., Disp: 90 tablet, Rfl: 1 .  nebivolol (BYSTOLIC) 2.5 MG tablet, Take 1 tablet (2.5 mg total) by mouth daily., Disp: 90 tablet, Rfl: 1 .  vitamin B-12 (CYANOCOBALAMIN) 1000 MCG tablet, Take 1,000 mcg by mouth daily., Disp: , Rfl:   EXAM:  VITALS  per patient if applicable:see hpi  GENERAL: alert, oriented, appears well and in no acute distress  HEENT: atraumatic, conjunttiva clear, no obvious abnormalities on inspection of external nose and ears  NECK: normal movements of the head and neck  LUNGS: on inspection no signs of respiratory distress, breathing rate appears normal, no obvious gross SOB, gasping or wheezing  CV: no obvious cyanosis  MS: moves all visible extremities without noticeable  abnormality  PSYCH/NEURO: pleasant and cooperative, no obvious depression or anxiety, speech and thought processing grossly intact  ASSESSMENT AND PLAN:  Discussed the following assessment and plan:  Racing heart beat - Plan: Ambulatory referral to Cardiology  Palpitations - Plan: Ambulatory referral to Cardiology  Essential hypertension - Plan: Ambulatory referral to Cardiology  -we discussed possible serious and likely etiologies, options for evaluation and workup, limitations of telemedicine visit vs in person visit, treatment, treatment risks and precautions. After a lengthy discussion she opted to see cardiology and get an EKG in the interim to start with. Denies symptoms currently and agrees to seek prompt in person care if worsening or new symptoms arise in the interim. She agrees to stop meloxicam for now as reports has worried about taking it. Has follow up with PCP later this month. Advised I am hopeful this ends up being nothing more then a recurrence of her palpitations that may be managed by getting back on a BB, but needs further in person evaluation prior to making that determination.   I discussed the assessment and treatment plan with the patient. The patient was provided an opportunity to ask questions and all were answered. The patient agreed with the plan and demonstrated an understanding of the instructions.   The patient was advised to call back or seek an in-person evaluation if the symptoms worsen or if the condition fails to improve as anticipated.   Lucretia Kern, DO   Patient Instructions  -We placed a referral for you as discussed to the cardiologist. Please call if you are not contacted regarding this appointment in the next 2 days.   -I sent a message to my nurse to schedule and EKG at our office. Please call if you have not heard form our office today.  -please seek emergency care immediately if any sustained symptoms, chest pain, difficulty breathing, new  symptoms or symptoms are worsening.

## 2019-08-13 NOTE — Telephone Encounter (Signed)
Called the pt and informed her of the message below.  Patient scheduled an appt today with Dr Maudie Mercury and stated she would prefer to see Dr Ethlyn Gallery also in office.  Informed of the next opening which is for 9/25 and this was scheduled also per pts preference.

## 2019-08-17 ENCOUNTER — Other Ambulatory Visit: Payer: Self-pay

## 2019-08-17 ENCOUNTER — Encounter: Payer: Self-pay | Admitting: Cardiovascular Disease

## 2019-08-17 ENCOUNTER — Ambulatory Visit (INDEPENDENT_AMBULATORY_CARE_PROVIDER_SITE_OTHER): Payer: Managed Care, Other (non HMO) | Admitting: Cardiovascular Disease

## 2019-08-17 VITALS — BP 126/88 | HR 81 | Ht 65.0 in | Wt 158.1 lb

## 2019-08-17 DIAGNOSIS — I10 Essential (primary) hypertension: Secondary | ICD-10-CM | POA: Diagnosis not present

## 2019-08-17 MED ORDER — CARVEDILOL 12.5 MG PO TABS: 12.5000 mg | ORAL_TABLET | Freq: Two times a day (BID) | ORAL | 3 refills | Status: DC

## 2019-08-17 NOTE — Progress Notes (Signed)
Cardiology Office Note:    Date:  08/17/2019   ID:  KAMDYNN FERNELIUS, DOB January 20, 1956, MRN [id number]  PCP:  Caren Macadam, MD  Cardiologist:  Nahser   Electrophysiologist:  None   Referring MD: Lucretia Kern, DO   Chief Complaint  Patient presents with  . Palpitations    History of Present Illness:    Elizabeth Armstrong is a 63 y.o. female with a hx of HTN is been having some palpitations for the past several weeks.  HR was elevated at baseline.   Felt flushed.  Occurred at work  BP was elevated several weeks ago  Noticed that her HR was "racing" for a few seconds.   Would last for few seconds .plays golf several tiems a week Light cardio and weights several times a week. Rides stationary bike   Has tried Bystolic ( from Dr. Doreatha Lew) .  Insurance stopped paying for it and she changed to amlodipine a year ago Has gained some weight .   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  Past Medical History:  Diagnosis Date  . B12 deficiency 04/17/2019  . Essential hypertension 11/19/2014  . HTN (hypertension)   . Hypercholesteremia   . Melanoma of skin ([place]) 08/25/2015   sees dermatologist at least twice per year, Dr. Denna Haggard  . Osteoarthritis 11/19/2014  . Osteoarthrosis, unspecified whether generalized or localized, involving lower leg 11/19/2014   of knees, s/p knee replacements bilat, sees [facility]  . Osteopenia 01/04/2017  . Palpitations 08/25/2015  . Pure hypercholesterolemia 11/19/2014  . Vaginal atrophy 08/25/2015    Past Surgical History:  Procedure Laterality Date  . APPENDECTOMY  2009  . CESAREAN SECTION    . JOINT REPLACEMENT Bilateral    knees  . KNEE SURGERY  2009   left  . OPEN REDUCTION INTERNAL FIXATION (ORIF) METACARPAL Left 06/19/2018   Procedure: LEFT SMALL FINGER OPEN REDUCTION INTERNAL  FIXATION (ORIF) CARPOMETACARPAL FUSION;  Surgeon: Leanora Cover, MD;  Location: [place];  Service: Orthopedics;  Laterality: Left;  . WRIST SURGERY  2007   right    Current Medications: Current Meds  Medication Sig  . Cholecalciferol (VITAMIN D3 PO) Take 1,000 Units by mouth daily.  Marland Kitchen conjugated estrogens (PREMARIN) vaginal cream INSERT 1 APPLICATORFUL VAGINALLY EVERY 15- 30 (THIRTY) DAYS.  . Magnesium 500 MG TABS Take by mouth daily.  . meloxicam (MOBIC) 7.5 MG tablet Take 1 tablet (7.5 mg total) by mouth daily as needed.  . vitamin B-12 (CYANOCOBALAMIN) 1000 MCG tablet Take 1,000 mcg by mouth daily.  . [DISCONTINUED] amLODipine (NORVASC) 5 MG tablet Take 1.5 tablets (7.5 mg total) by mouth daily.     Allergies:   Patient has no known allergies.   Social History   Socioeconomic History  . Marital status: Married    Spouse name: Not on file  . Number of children: 1  . Years of education: Not on file  . Highest education level: Not on file  Occupational History  . Occupation: Tree surgeon  Social Needs  . Financial resource strain: Not on file  . Food insecurity    Worry: Not on file    Inability: Not on file  . Transportation needs    Medical: Not on file    Non-medical: Not on file  Tobacco  Use  . Smoking status: Never Smoker  . Smokeless tobacco: Never Used  Substance and Sexual Activity  . Alcohol use: Yes    Alcohol/week: 3.0 standard drinks    Types: 3 Glasses of wine per week    Comment: social  . Drug use: Never  . Sexual activity: Not Currently  Lifestyle  . Physical activity    Days per week: Not on file    Minutes per session: Not on file  . Stress: Not on file  Relationships  . Social Herbalist on phone: Not on file    Gets together: Not on file    Attends religious service: Not on file    Active member of club or organization: Not on file    Attends meetings of clubs or organizations: Not on file    Relationship  status: Not on file  Other Topics Concern  . Not on file  Social History Narrative   Work or School: Nurse, adult Situation: lives with husband      Spiritual Beliefs: catholic      Lifestyle: very active with cycling and tennis; healthy diet        Family History: The patient's family history includes Heart disease in her unknown relative.  ROS:   Please see the history of present illness.     All other systems reviewed and are negative.  EKGs/Labs/Other Studies Reviewed:    The following studies were reviewed today:   EKG:     Recent Labs: 06/26/2019: ALT 13; BUN 20; Creatinine, Ser 0.70; Hemoglobin 13.7; Platelets 219.0; Potassium 4.4; Sodium 140; TSH 1.31  Recent Lipid Panel    Component Value Date/Time   CHOL 211 (H) 06/26/2019 0814   TRIG 66.0 06/26/2019 0814   HDL 91.00 06/26/2019 0814   CHOLHDL 2 06/26/2019 0814   VLDL 13.2 06/26/2019 0814   LDLCALC 107 (H) 06/26/2019 0814    Physical Exam:    VS:  BP 126/88   Pulse 81   Ht 5\' 5"  (1.651 m)   Wt 158 lb 1.9 oz (71.7 kg)   SpO2 99%   BMI 26.31 kg/m     Wt Readings from Last 3 Encounters:  08/17/19 158 lb 1.9 oz (71.7 kg)  07/03/19 160 lb 14.4 oz (73 kg)  12/27/18 159 lb (72.1 kg)     GEN:  Well nourished, well developed in no acute distress HEENT: Normal NECK: No JVD; No carotid bruits LYMPHATICS: No lymphadenopathy CARDIAC: RRR,  Soft systolic murmur radiating to axilla  RESPIRATORY:  Clear to auscultation without rales, wheezing or rhonchi  ABDOMEN: Soft, non-tender, non-distended MUSCULOSKELETAL:  No edema; No deformity  SKIN: Warm and dry NEUROLOGIC:  Alert and oriented x 3 PSYCHIATRIC:  Normal affect   ASSESSMENT:    1. Essential hypertension    PLAN:    In order of problems listed above:  1. Hypertension: Robertta presents for further management and evaluation of her hypertension associated with palpitations.  She was previously well controlled on Bystolic but insurance stopped  paying for this.  She was placed on amlodipine.  Since that time she has had flushing and worsening palpitations.  She is gained about 15 pounds during this COVID crisis and states her blood pressure and the palpitations have been worse.  I think that amlodipine might in fact be worsening her palpitations and flushing sensation.  We will discontinue the amlodipine and start her on carvedilol 12.5 mg twice a day.  We will see her back for a nurse visit or office visit in approximately 2 weeks.    We will see her 3 months as well.  2.  Systolic murmur :   She has a systolic murmur that is c/w mild MR.   Continue to follow for now   Medication Adjustments/Labs and Tests Ordered: Current medicines are reviewed at length with the patient today.  Concerns regarding medicines are outlined above.  No orders of the defined types were placed in this encounter.  Meds ordered this encounter  Medications  . carvedilol (COREG) 12.5 MG tablet    Sig: Take 1 tablet (12.5 mg total) by mouth 2 (two) times daily.    Dispense:  180 tablet    Refill:  3    Patient Instructions  Medication Instructions:  Your physician has recommended you make the following change in your medication: STOP Amlodipine  START Carvedilol (Coreg) 12.5 mg take one pill twice daily (12 hours apart)  If you need a refill on your cardiac medications before your next appointment, please call your pharmacy.    Lab work: None Ordered   Testing/Procedures: None Ordered   Follow-Up: Your physician recommends that you schedule a follow-up appointment in: 2 weeks for Nurse Visit/BP check on a day that Dr. Acie Fredrickson is in the office    At [facility], you and your health needs are our priority.  As part of our continuing mission to provide you with exceptional heart care, we have created designated Provider Care Teams.  These Care Teams include your primary Cardiologist (physician) and Advanced Practice Providers (APPs -   Physician Assistants and Nurse Practitioners) who all work together to provide you with the care you need, when you need it. You will need a follow up appointment in:  3 months.  Please call our office 2 months in advance to schedule this appointment.  You may see Dr. Acie Fredrickson or one of the following Advanced Practice Providers on your designated Care Team: Richardson Dopp, PA-C [place], [place] . Daune Perch, NP        Signed, Mertie Moores, MD  08/17/2019 4:29 PM    [place]

## 2019-08-17 NOTE — Patient Instructions (Addendum)
Medication Instructions:  Your physician has recommended you make the following change in your medication: STOP Amlodipine  START Carvedilol (Coreg) 12.5 mg take one pill twice daily (12 hours apart)  If you need a refill on your cardiac medications before your next appointment, please call your pharmacy.    Lab work: None Ordered   Testing/Procedures: None Ordered   Follow-Up: Your physician recommends that you schedule a follow-up appointment in: 2 weeks for Nurse Visit/BP check on a day that Dr. Acie Fredrickson is in the office    At [facility], you and your health needs are our priority.  As part of our continuing mission to provide you with exceptional heart care, we have created designated Provider Care Teams.  These Care Teams include your primary Cardiologist (physician) and Advanced Practice Providers (APPs -  Physician Assistants and Nurse Practitioners) who all work together to provide you with the care you need, when you need it. You will need a follow up appointment in:  3 months.  Please call our office 2 months in advance to schedule this appointment.  You may see Dr. Acie Fredrickson or one of the following Advanced Practice Providers on your designated Care Team: Richardson Dopp, PA-C [place], [place] . Daune Perch, NP

## 2019-08-28 ENCOUNTER — Telehealth: Payer: Managed Care, Other (non HMO) | Admitting: Family Medicine

## 2019-08-31 ENCOUNTER — Ambulatory Visit (INDEPENDENT_AMBULATORY_CARE_PROVIDER_SITE_OTHER): Payer: Managed Care, Other (non HMO) | Admitting: *Deleted

## 2019-08-31 ENCOUNTER — Other Ambulatory Visit: Payer: Self-pay

## 2019-08-31 VITALS — BP 138/78 | HR 64 | Ht 65.0 in | Wt 159.0 lb

## 2019-08-31 DIAGNOSIS — I10 Essential (primary) hypertension: Secondary | ICD-10-CM

## 2019-08-31 NOTE — Progress Notes (Signed)
Reviewed nurse visit see recommendations ./cy

## 2019-08-31 NOTE — Progress Notes (Signed)
Pt came in today for a nurse visit per Dr. Acie Fredrickson ov note 08/17/19. Dr. Acie Fredrickson had changed pt's BP meds from Amlodipine to Coreg 12.5 mg BID due to pt c/o swelling and palpitations while on Amlodipine. Pt today states she still feels palpitations when she is laying bed at night time. She is currently on Coreg 12.5 mg BID. BP today 138/78 HR 64. Pt also took her BP with her wrist cuff to compare readings, pt's cuff reading was 140/88 hr 62. Pt states she swelling is better since d/c Amlodipine. Denies sob, dizziness, chest pain, fever, dysuria, cough. I went to Dr. Acie Fredrickson with my notes from today. He feels pt is stable and to continue on Coreg 12.5 mg BID, keep f/u in December 2020. Pt has been advised of recommendations from Dr. Acie Fredrickson and is agreeable to plan of care. Pt thanked [place] for our timer today. Went over as to proper protocol on how to take BP readings.Pt left the office today with verbal understanding to plan of care.

## 2019-11-15 ENCOUNTER — Encounter: Payer: Self-pay | Admitting: Cardiovascular Disease

## 2019-11-15 NOTE — Progress Notes (Signed)
Cardiology Office Note:    Date:  11/16/2019   ID:  ZEE PFUHL, DOB May 26, 1956, MRN [id number]  PCP:  Caren Macadam, MD  Cardiologist:  Yoskar Murrillo   Electrophysiologist:  None   Referring MD: Caren Macadam, MD   Chief Complaint  Patient presents with  . Hypertension    History of Present Illness:    Elizabeth Armstrong is a 63 y.o. female with a hx of HTN is been having some palpitations for the past several weeks.  HR was elevated at baseline.   Felt flushed.  Occurred at work  BP was elevated several weeks ago  Noticed that her HR was "racing" for a few seconds.   Would last for few seconds .plays golf several tiems a week Light cardio and weights several times a week. Rides stationary bike   Has tried Bystolic ( from Dr. Doreatha Lew) .  Insurance stopped paying for it and she changed to amlodipine a year ago Has gained some weight .   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  Dec, 14,  2020   Bhuvi is seen today for follow up of her HTN. BP has been a bit elevated Is exercising some  On Coreg 12. 5 BID,  Is off amlodipine  Takes Mobic 7.5 mg about every day  We will add HCTZ 25 mg a day and Kdur 10 meq a day      Past Medical History:  Diagnosis Date  . B12 deficiency 04/17/2019  . Essential hypertension 11/19/2014  . HTN (hypertension)   . Hypercholesteremia   . Melanoma of skin ([place]) 08/25/2015   sees dermatologist at least twice per year, Dr. Denna Haggard  . Osteoarthritis 11/19/2014  . Osteoarthrosis, unspecified whether generalized or localized, involving lower leg 11/19/2014   of knees, s/p knee replacements bilat, sees [facility]  . Osteopenia 01/04/2017  . Palpitations 08/25/2015  . Pure hypercholesterolemia 11/19/2014  . Vaginal atrophy 08/25/2015    Past Surgical History:   Procedure Laterality Date  . APPENDECTOMY  2009  . CESAREAN SECTION    . JOINT REPLACEMENT Bilateral    knees  . KNEE SURGERY  2009   left  . OPEN REDUCTION INTERNAL FIXATION (ORIF) METACARPAL Left 06/19/2018   Procedure: LEFT SMALL FINGER OPEN REDUCTION INTERNAL FIXATION (ORIF) CARPOMETACARPAL FUSION;  Surgeon: Leanora Cover, MD;  Location: [place];  Service: Orthopedics;  Laterality: Left;  . WRIST SURGERY  2007   right    Current Medications: Current Meds  Medication Sig  . carvedilol (COREG) 12.5 MG tablet Take 1 tablet (12.5 mg total) by mouth 2 (two) times daily.  . Cholecalciferol (VITAMIN D3 PO) Take 1,000 Units by mouth daily.  Marland Kitchen conjugated estrogens (PREMARIN) vaginal cream INSERT 1 APPLICATORFUL VAGINALLY EVERY 15- 30 (THIRTY) DAYS.  . Magnesium 500 MG TABS Take by mouth daily.  . meloxicam (MOBIC) 7.5 MG tablet Take 1 tablet (7.5 mg total) by mouth daily as needed.  . vitamin B-12 (CYANOCOBALAMIN) 1000 MCG tablet Take 1,000 mcg by mouth daily.     Allergies:   Patient has no known allergies.   Social History   Socioeconomic History  . Marital status: Married    Spouse name: Not on file  . Number of children: 1  . Years of education: Not on file  .  Highest education level: Not on file  Occupational History  . Occupation: Tree surgeon  Tobacco Use  . Smoking status: Never Smoker  . Smokeless tobacco: Never Used  Substance and Sexual Activity  . Alcohol use: Yes    Alcohol/week: 3.0 standard drinks    Types: 3 Glasses of wine per week    Comment: social  . Drug use: Never  . Sexual activity: Not Currently  Other Topics Concern  . 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 Difficulty of Paying Living  Expenses: Not on file  Food Insecurity:   . Worried About Charity fundraiser in the Last Year: Not on file  . Ran Out of Food in the Last Year: Not on file  Transportation Needs:   . Lack of Transportation (Medical): Not on file  . Lack of Transportation (Non-Medical): Not on file  Physical Activity:   . Days of Exercise per Week: Not on file  . Minutes of Exercise per Session: Not on file  Stress:   . Feeling of Stress : Not on file  Social Connections:   . Frequency of Communication with Friends and Family: Not on file  . Frequency of Social Gatherings with Friends and Family: Not on file  . Attends Religious Services: Not on file  . Active Member of Clubs or Organizations: Not on file  . Attends Archivist Meetings: Not on file  . Marital Status: Not on file     Family History: The patient's family history includes Heart disease in her unknown relative.  ROS:   Please see the history of present illness.     All other systems reviewed and are negative.  EKGs/Labs/Other Studies Reviewed:    The following studies were reviewed today:   EKG:     Recent Labs: 06/26/2019: ALT 13; BUN 20; Creatinine, Ser 0.70; Hemoglobin 13.7; Platelets 219.0; Potassium 4.4; Sodium 140; TSH 1.31  Recent Lipid Panel    Component Value Date/Time   CHOL 211 (H) 06/26/2019 0814   TRIG 66.0 06/26/2019 0814   HDL 91.00 06/26/2019 0814   CHOLHDL 2 06/26/2019 0814   VLDL 13.2 06/26/2019 0814   LDLCALC 107 (H) 06/26/2019 0814    Physical Exam:    Physical Exam: Blood pressure (!) 146/88, pulse 67, height 5\' 4"  (1.626 m), weight 157 lb 12.8 oz (71.6 kg), SpO2 97 %.  GEN:  Well nourished, well developed in no acute distress HEENT: Normal NECK: No JVD; No carotid bruits LYMPHATICS: No lymphadenopathy CARDIAC: RRR , no murmurs, rubs, gallops RESPIRATORY:  Clear to auscultation without rales, wheezing or rhonchi  ABDOMEN: Soft, non-tender, non-distended MUSCULOSKELETAL:  No edema;  No deformity  SKIN: Warm and dry NEUROLOGIC:  Alert and oriented x 3    ASSESSMENT:    1. Essential hypertension    PLAN:    In order of problems listed above:  Hypertension: She is tolerating the carvedilol fairly well.  She does describe little bit of fatigue early in the morning about an hour after she takes the Coreg.  I advised her to work on getting more exercise.  We can make some adjustments to the carvedilol if it turns out  that it is the cause of her fatigue but she admits that she is not been exercising as much as she would need to.  We will also start hydrochlorothiazide 25 mg a day and potassium chloride 10 mEq a day.  We will check a basic metabolic profile in 3 weeks.  We will have her follow-up with an APP in 3 months.    Medication Adjustments/Labs and Tests Ordered: Current medicines are reviewed at length with the patient today.  Concerns regarding medicines are outlined above.  Orders Placed This Encounter  Procedures  . Basic Metabolic Panel (BMET)   Meds ordered this encounter  Medications  . hydrochlorothiazide (HYDRODIURIL) 25 MG tablet    Sig: Take 1 tablet (25 mg total) by mouth daily.    Dispense:  90 tablet    Refill:  3  . potassium chloride (KLOR-CON) 10 MEQ tablet    Sig: Take 1 tablet (10 mEq total) by mouth daily.    Dispense:  90 tablet    Refill:  3     Patient Instructions  Medication Instructions:  Your physician has recommended you make the following change in your medication:  START HCTZ (Hydrochlorothiazide) 25 mg once daily in the morning START Kdur (Potassium chloride) 10 mEq once daily  *If you need a refill on your cardiac medications before your next appointment, please call your pharmacy*  Lab Work: Your physician recommends that you return for lab work in: 3 weeks on  You do not have to fast for this lab appointment Your appointment time is flexible; you may arrive anytime between 7:30 am and 4:45 pm on the designated  date  If you have labs (blood work) drawn today and your tests are completely normal, you will receive your results only by: Marland Kitchen MyChart Message (if you have MyChart) OR . A paper copy in the mail If you have any lab test that is abnormal or we need to change your treatment, we will call you to review the results.   Testing/Procedures: None Ordered   Follow-Up: At [facility], you and your health needs are our priority.  As part of our continuing mission to provide you with exceptional heart care, we have created designated Provider Care Teams.  These Care Teams include your primary Cardiologist (physician) and Advanced Practice Providers (APPs -  Physician Assistants and Nurse Practitioners) who all work together to provide you with the care you need, when you need it.  Your next appointment:   3 month(s)  The format for your next appointment:   Either In Person or Virtual  Provider:   Richardson Dopp, PA-C, Robbie Lis, PA-C or Daune Perch, NP       Signed, Mertie Moores, MD  11/16/2019 10:36 AM    [place]

## 2019-11-16 ENCOUNTER — Other Ambulatory Visit: Payer: Self-pay

## 2019-11-16 ENCOUNTER — Ambulatory Visit (INDEPENDENT_AMBULATORY_CARE_PROVIDER_SITE_OTHER): Payer: Medicare Other | Admitting: Cardiovascular Disease

## 2019-11-16 ENCOUNTER — Encounter: Payer: Self-pay | Admitting: Cardiovascular Disease

## 2019-11-16 VITALS — BP 146/88 | HR 67 | Ht 64.0 in | Wt 157.8 lb

## 2019-11-16 DIAGNOSIS — I10 Essential (primary) hypertension: Secondary | ICD-10-CM

## 2019-11-16 MED ORDER — HYDROCHLOROTHIAZIDE 25 MG PO TABS: 25.0000 mg | ORAL_TABLET | Freq: Every day | ORAL | 3 refills | Status: DC

## 2019-11-16 MED ORDER — POTASSIUM CHLORIDE ER 10 MEQ PO TBCR: 10.0000 meq | EXTENDED_RELEASE_TABLET | Freq: Every day | ORAL | 3 refills | Status: DC

## 2019-11-16 NOTE — Patient Instructions (Signed)
Medication Instructions:  Your physician has recommended you make the following change in your medication:  START HCTZ (Hydrochlorothiazide) 25 mg once daily in the morning START Kdur (Potassium chloride) 10 mEq once daily  *If you need a refill on your cardiac medications before your next appointment, please call your pharmacy*  Lab Work: Your physician recommends that you return for lab work in: 3 weeks on  You do not have to fast for this lab appointment Your appointment time is flexible; you may arrive anytime between 7:30 am and 4:45 pm on the designated date  If you have labs (blood work) drawn today and your tests are completely normal, you will receive your results only by: Marland Kitchen MyChart Message (if you have MyChart) OR . A paper copy in the mail If you have any lab test that is abnormal or we need to change your treatment, we will call you to review the results.   Testing/Procedures: None Ordered   Follow-Up: At [facility], you and your health needs are our priority.  As part of our continuing mission to provide you with exceptional heart care, we have created designated Provider Care Teams.  These Care Teams include your primary Cardiologist (physician) and Advanced Practice Providers (APPs -  Physician Assistants and Nurse Practitioners) who all work together to provide you with the care you need, when you need it.  Your next appointment:   3 month(s)  The format for your next appointment:   Either In Person or Virtual  Provider:   Richardson Dopp, PA-C, Robbie Lis, PA-C or Daune Perch, NP

## 2019-12-10 ENCOUNTER — Other Ambulatory Visit: Payer: Medicare Other | Admitting: *Deleted

## 2019-12-10 ENCOUNTER — Other Ambulatory Visit: Payer: Self-pay

## 2019-12-10 DIAGNOSIS — I10 Essential (primary) hypertension: Secondary | ICD-10-CM

## 2019-12-10 LAB — BASIC METABOLIC PANEL
BUN/Creatinine Ratio: 19 (ref 12–28)
BUN: 14 mg/dL (ref 8–27)
CO2: 24 mmol/L (ref 20–29)
Calcium: 9.7 mg/dL (ref 8.7–10.3)
Chloride: 93 mmol/L — ABNORMAL LOW (ref 96–106)
Creatinine, Ser: 0.72 mg/dL (ref 0.57–1.00)
GFR calc Af Amer: 102 mL/min/{1.73_m2} (ref >59)
GFR calc non Af Amer: 88 mL/min/{1.73_m2} (ref >59)
Glucose: 111 mg/dL — ABNORMAL HIGH (ref 65–99)
Potassium: 4.3 mmol/L (ref 3.5–5.2)
Sodium: 132 mmol/L — ABNORMAL LOW (ref 134–144)

## 2019-12-15 ENCOUNTER — Telehealth: Payer: Self-pay | Admitting: *Deleted

## 2019-12-15 ENCOUNTER — Encounter: Payer: Self-pay | Admitting: Family Medicine

## 2019-12-15 NOTE — Telephone Encounter (Signed)
Patient scheduled for 12/18/19.  Copied from [place] [id number]. Topic: General - Other >> Dec 15, 2019  4:14 PM Leward Quan A wrote: Reason for CRM: Patient called to schedule an appointment with Micheline Rough ststes that she noticed a swollen area at the base of her throat above the clavicle and want to be scheduled virtually first office visit was 01/15/20. Please call patient at Ph# [phone] to help her schedule her appointment

## 2019-12-18 ENCOUNTER — Other Ambulatory Visit: Payer: Self-pay

## 2019-12-18 ENCOUNTER — Encounter: Payer: Self-pay | Admitting: Family Medicine

## 2019-12-18 ENCOUNTER — Telehealth (INDEPENDENT_AMBULATORY_CARE_PROVIDER_SITE_OTHER): Payer: Medicare Other | Admitting: Family Medicine

## 2019-12-18 DIAGNOSIS — R221 Localized swelling, mass and lump, neck: Secondary | ICD-10-CM | POA: Diagnosis not present

## 2019-12-18 NOTE — Progress Notes (Signed)
Virtual Visit via Video Note  I connected with Elizabeth Armstrong  on 12/18/19 at  9:30 AM EST by a video enabled telemedicine application and verified that I am speaking with the correct person using two identifiers.  Location patient: home Location provider:work or home office Persons participating in the virtual visit: patient, provider  I discussed the limitations of evaluation and management by telemedicine and the availability of in person appointments. The patient expressed understanding and agreed to proceed.   Elizabeth Armstrong DOB: 1956/07/14 Encounter date: 12/18/2019  This is a 64 y.o. female who presents with No chief complaint on file.   History of present illness: Noted something unusual. At top of collarbones - right in middle - swollen, puffy. Noticed late Sunday, has decreased in swelling last couple of days. Doesn't feel bad. Not tender to touch. Doesn't feel cyst like, just soft and mushy feeling. Not liquidy, but soft. Not noting other lymph nodes in neck. Monday morning had a little sore throat but it went away by mid morning without anything. Had temp 99.1 today, but 98.6 daily otherwise. Not sore to swallow, no other URI sx. No shortness of breath, no cough. No fatigue.   Did some upper body workout before Sunday.   Saturday/sunday started taking collagen (only other new thing that she though of). Stopped this.   No other rashes or skin changes.   At least 50-75 percent better than Monday.   Blood pressure is getting under control. Got rid of amlodipine (which wasn't working well with her) and still has some trouble keeping below 136-140; has been on the hctz for a few weeks and leveling off some. Doing more exercise and working on weight loss.     HPI     No Known Allergies No outpatient medications have been marked as taking for the 12/18/19 encounter (Telemedicine) with Caren Macadam, MD.    Review of Systems  Constitutional: Negative for  chills, fatigue and fever.  HENT: Negative for congestion, ear pain, postnasal drip, sinus pressure, sinus pain, sore throat and trouble swallowing.   Respiratory: Negative for cough, chest tightness, shortness of breath, wheezing and stridor.   Cardiovascular: Negative for chest pain, palpitations and leg swelling.    Objective:  There were no vitals taken for this visit.      BP Readings from Last 3 Encounters:  11/16/19 (!) 146/88  08/31/19 138/78  08/17/19 126/88   Wt Readings from Last 3 Encounters:  11/16/19 157 lb 12.8 oz (71.6 kg)  08/31/19 159 lb (72.1 kg)  08/17/19 158 lb 1.9 oz (71.7 kg)    EXAM:  GENERAL: alert, oriented, appears well and in no acute distress  HEENT: atraumatic, conjunctiva clear, no obvious abnormalities on inspection of external nose and ears  NECK: normal movements of the head and neck. There is a soft appearing palpable enlargement supraclavicular notch.  LUNGS: on inspection no signs of respiratory distress, breathing rate appears normal, no obvious gross SOB, gasping or wheezing  CV: no obvious cyanosis  MS: moves all visible extremities without noticeable abnormality  PSYCH/NEURO: pleasant and cooperative, no obvious depression or anxiety, speech and thought processing grossly intact   Assessment/Plan 1. Neck swelling Nonspecific, but if not resolved by next week we discussed getting thyroid/neck US for evaluation. Swelling is below area of thyroid, but we discussed that there could potentially be some swelling secondary to issue with thyroid or other lymph enlargement. Other thoughts - possible related to exercise upper extremities/new med?  But seems less likely.    Return for mychart message sent and she will update me next week..   I discussed the assessment and treatment plan with the patient. The patient was provided an opportunity to ask questions and all were answered. The patient agreed with the plan and demonstrated an  understanding of the instructions.   The patient was advised to call back or seek an in-person evaluation if the symptoms worsen or if the condition fails to improve as anticipated.  I provided 15 minutes of non-face-to-face time during this encounter.   Micheline Rough, MD

## 2019-12-23 ENCOUNTER — Encounter: Payer: Self-pay | Admitting: Family Medicine

## 2020-01-01 ENCOUNTER — Ambulatory Visit: Payer: Medicare Other

## 2020-01-09 ENCOUNTER — Ambulatory Visit: Payer: Medicare Other | Attending: Internal Medicine

## 2020-01-09 DIAGNOSIS — Z23 Encounter for immunization: Secondary | ICD-10-CM | POA: Insufficient documentation

## 2020-01-09 NOTE — Progress Notes (Signed)
   Covid-19 Vaccination Clinic  Name:  Elizabeth Armstrong    MRN: [id number] DOB: 06-05-1956  01/09/2020  [name] was observed post Covid-19 immunization for 15 minutes without incidence. She was provided with Vaccine Information Sheet and instruction to access the V-Safe system.   [name] was instructed to call 911 with any severe reactions post vaccine: Marland Kitchen Difficulty breathing  . Swelling of your face and throat  . A fast heartbeat  . A bad rash all over your body  . Dizziness and weakness    Immunizations Administered    Name Date Dose VIS Date Route   Pfizer COVID-19 Vaccine 01/09/2020  4:08 PM 0.3 mL 11/13/2019 Intramuscular   Manufacturer: [place]   Lot: [id number]   [place]: [id number]

## 2020-02-03 ENCOUNTER — Ambulatory Visit: Payer: Medicare Other | Attending: Internal Medicine

## 2020-02-03 DIAGNOSIS — Z23 Encounter for immunization: Secondary | ICD-10-CM | POA: Insufficient documentation

## 2020-02-03 NOTE — Progress Notes (Signed)
   Covid-19 Vaccination Clinic  Name:  Elizabeth Armstrong    MRN: [id number] DOB: June 08, 1956  02/03/2020  [name] was observed post Covid-19 immunization for 15 minutes without incident. She was provided with Vaccine Information Sheet and instruction to access the V-Safe system.   [name] was instructed to call 911 with any severe reactions post vaccine: Marland Kitchen Difficulty breathing  . Swelling of face and throat  . A fast heartbeat  . A bad rash all over body  . Dizziness and weakness   Immunizations Administered    Name Date Dose VIS Date Route   Pfizer COVID-19 Vaccine 02/03/2020 12:22 PM 0.3 mL 11/13/2019 Intramuscular   Manufacturer: [place]   Lot: [id number]   [place]: [id number]

## 2020-02-04 ENCOUNTER — Other Ambulatory Visit: Payer: Self-pay | Admitting: Family Medicine

## 2020-02-04 DIAGNOSIS — M1990 Unspecified osteoarthritis, unspecified site: Secondary | ICD-10-CM

## 2020-02-15 ENCOUNTER — Telehealth: Payer: Self-pay | Admitting: Physician Assistant

## 2020-02-15 NOTE — Telephone Encounter (Signed)
Lab results given to patient.

## 2020-02-15 NOTE — Telephone Encounter (Signed)
Called for lab results

## 2020-02-16 ENCOUNTER — Other Ambulatory Visit: Payer: Self-pay

## 2020-02-16 ENCOUNTER — Ambulatory Visit (INDEPENDENT_AMBULATORY_CARE_PROVIDER_SITE_OTHER): Payer: Medicare Other | Admitting: Family Medicine

## 2020-02-16 ENCOUNTER — Encounter: Payer: Self-pay | Admitting: Physician Assistant

## 2020-02-16 VITALS — BP 118/62 | HR 69 | Ht 64.0 in | Wt 160.1 lb

## 2020-02-16 DIAGNOSIS — I10 Essential (primary) hypertension: Secondary | ICD-10-CM | POA: Diagnosis not present

## 2020-02-16 LAB — BASIC METABOLIC PANEL
BUN/Creatinine Ratio: 15 (ref 12–28)
BUN: 11 mg/dL (ref 8–27)
CO2: 25 mmol/L (ref 20–29)
Calcium: 9.5 mg/dL (ref 8.7–10.3)
Chloride: 95 mmol/L — ABNORMAL LOW (ref 96–106)
Creatinine, Ser: 0.74 mg/dL (ref 0.57–1.00)
GFR calc Af Amer: 98 mL/min/{1.73_m2} (ref >59)
GFR calc non Af Amer: 85 mL/min/{1.73_m2} (ref >59)
Glucose: 98 mg/dL (ref 65–99)
Potassium: 4.3 mmol/L (ref 3.5–5.2)
Sodium: 135 mmol/L (ref 134–144)

## 2020-02-16 NOTE — Progress Notes (Addendum)
Cardiology Office Note  Date: 02/18/2020   ID: Elizabeth Armstrong, DOB [birth year], MRN [id number]  PCP:  Caren Macadam, MD  Cardiologist:  Mertie Moores, MD Electrophysiologist:  None   Chief Complaint: Follow-up hypertension  History of Present Illness: Elizabeth Armstrong is a 64 y.o. female with a history of palpitations, hypertension last seen by Dr. Katharina Caper on 11/16/2019 with complaints of palpitations for several weeks.  She had baseline elevated blood pressure.  Her amlodipine was increased at last visit.  HCTZ 25 mg a day and K-Dur 10 mEq a day was added at that visit.  Other history includes hyperlipidemia, vitamin B12 deficiency, melanoma.    Patient denies any recent acute illnesses, hospitalizations, surgeries, or travels.  She denies any progressive anginal or exertional symptoms, palpitations or arrhythmias, orthostatic symptoms or syncopal/near syncopal episode, CVA or TIA-like symptoms, bleeding, DVT or PE-like symptoms, or lower extremity edema.  States she really needs to lose about 10 pounds and she is planning on increasing her activity.  Recently started on HCTZ with normal renal function on recent labs on 12/10/2019 with a creatinine of 0.72, GFR of 88.  Sodium slightly below lower limits of normal at 132.    Past Medical History:  Diagnosis Date  . B12 deficiency 04/17/2019  . Essential hypertension 11/19/2014  . HTN (hypertension)   . Hypercholesteremia   . Melanoma of skin ([place]) 08/25/2015   sees dermatologist at least twice per year, Dr. Denna Haggard  . Osteoarthritis 11/19/2014  . Osteoarthrosis, unspecified whether generalized or localized, involving lower leg 11/19/2014   of knees, s/p knee replacements bilat, sees [facility]  . Osteopenia 01/04/2017  . Palpitations 08/25/2015  . Pure hypercholesterolemia 11/19/2014  . Vaginal atrophy 08/25/2015    Past Surgical History:  Procedure Laterality Date  . APPENDECTOMY  2009  . CESAREAN SECTION    . JOINT  REPLACEMENT Bilateral    knees  . KNEE SURGERY  2009   left  . OPEN REDUCTION INTERNAL FIXATION (ORIF) METACARPAL Left 06/19/2018   Procedure: LEFT SMALL FINGER OPEN REDUCTION INTERNAL FIXATION (ORIF) CARPOMETACARPAL FUSION;  Surgeon: Leanora Cover, MD;  Location: [facility];  Service: Orthopedics;  Laterality: Left;  . WRIST SURGERY  2007   right    Current Outpatient Medications  Medication Sig Dispense Refill  . carvedilol (COREG) 12.5 MG tablet Take 1 tablet (12.5 mg total) by mouth 2 (two) times daily. 180 tablet 3  . Cholecalciferol (VITAMIN D3 PO) Take 1,000 Units by mouth daily.    Marland Kitchen conjugated estrogens (PREMARIN) vaginal cream INSERT 1 APPLICATORFUL VAGINALLY EVERY 15- 30 (THIRTY) DAYS. 30 g 5  . hydrochlorothiazide (HYDRODIURIL) 25 MG tablet Take 1 tablet (25 mg total) by mouth daily. 90 tablet 3  . meloxicam (MOBIC) 7.5 MG tablet Take 7.5 mg by mouth daily.    . potassium chloride (KLOR-CON) 10 MEQ tablet Take 1 tablet (10 mEq total) by mouth daily. 90 tablet 3  . vitamin B-12 (CYANOCOBALAMIN) 1000 MCG tablet Take 1,000 mcg by mouth daily.     No current facility-administered medications for this visit.   Allergies:  Patient has no known allergies.   Social History: The patient  reports that she has never smoked. She has never used smokeless tobacco. She reports current alcohol use of about 3.0 standard drinks of alcohol per week. She reports that she does not use drugs.   Family History: The patient's family history includes Heart disease in her unknown relative.  ROS:  Please see the history of present illness. Otherwise, complete review of systems is positive for none.  All other systems are reviewed and negative.   Physical Exam: VS:  BP 118/62   Pulse 69   Ht 5\' 4"  (1.626 m)   Wt 160 lb 1.9 oz (72.6 kg)   SpO2 98%   BMI 27.48 kg/m , BMI Body mass index is 27.48 kg/m.  Wt Readings from Last 3 Encounters:  02/16/20 160 lb 1.9 oz (72.6 kg)    11/16/19 157 lb 12.8 oz (71.6 kg)  08/31/19 159 lb (72.1 kg)    General: Patient appears comfortable at rest. Neck: Supple, no elevated JVP or carotid bruits, no thyromegaly. Lungs: Clear to auscultation, nonlabored breathing at rest. Cardiac: Regular rate and rhythm, no S3 or significant systolic murmur, no pericardial rub. Extremities: No pitting edema, distal pulses 2+. Skin: Warm and dry. Musculoskeletal: No kyphosis. Neuropsychiatric: Alert and oriented x3, affect grossly appropriate.  ECG:  An ECG dated 02/16/2020 was personally reviewed today and demonstrated:  Normal sinus rhythm rate of 69.  No acute ST or T wave abnormalities, normal axis.  Recent Labwork: 06/26/2019: ALT 13; AST 14; Hemoglobin 13.7; Platelets 219.0; TSH 1.31 02/16/2020: BUN 11; Creatinine, Ser 0.74; Potassium 4.3; Sodium 135     Component Value Date/Time   CHOL 211 (H) 06/26/2019 0814   TRIG 66.0 06/26/2019 0814   HDL 91.00 06/26/2019 0814   CHOLHDL 2 06/26/2019 0814   VLDL 13.2 06/26/2019 0814   LDLCALC 107 (H) 06/26/2019 0814    Other Studies Reviewed Today:  NONE  Assessment and Plan:  1. Essential hypertension    1. Essential hypertension Blood pressure well controlled on  carvedilol and HCTZ .Get BMET   Medication Adjustments/Labs and Tests Ordered: Current medicines are reviewed at length with the patient today.  Concerns regarding medicines are outlined above.   Disposition: Follow-up with Dr. Acie Fredrickson or APP  Signed, Levell July, NP 02/18/2020 10:55 PM    [place]

## 2020-02-16 NOTE — Patient Instructions (Signed)
Medication Instructions:   Your physician recommends that you continue on your current medications as directed. Please refer to the Current Medication list given to you today.  *If you need a refill on your cardiac medications before your next appointment, please call your pharmacy*  Lab Work:  You will have labs drawn today: BMET  If you have labs (blood work) drawn today and your tests are completely normal, you will receive your results only by: Marland Kitchen MyChart Message (if you have MyChart) OR . A paper copy in the mail If you have any lab test that is abnormal or we need to change your treatment, we will call you to review the results.  Testing/Procedures:  None ordered today  Follow-Up: At [facility], you and your health needs are our priority.  As part of our continuing mission to provide you with exceptional heart care, we have created designated Provider Care Teams.  These Care Teams include your primary Cardiologist (physician) and Advanced Practice Providers (APPs -  Physician Assistants and Nurse Practitioners) who all work together to provide you with the care you need, when you need it.  We recommend signing up for the patient portal called "MyChart".  Sign up information is provided on this After Visit Summary.  MyChart is used to connect with patients for Virtual Visits (Telemedicine).  Patients are able to view lab/test results, encounter notes, upcoming appointments, etc.  Non-urgent messages can be sent to your provider as well.   To learn more about what you can do with MyChart, go to [email or web address].    Your next appointment:   9 month(s)  The format for your next appointment:   In Person  Provider:   Mertie Moores, MD

## 2020-02-22 ENCOUNTER — Encounter: Payer: Self-pay | Admitting: Family Medicine

## 2020-03-15 ENCOUNTER — Encounter: Payer: Self-pay | Admitting: *Deleted

## 2020-03-15 ENCOUNTER — Encounter: Payer: Self-pay | Admitting: Physician Assistant

## 2020-03-15 ENCOUNTER — Ambulatory Visit (INDEPENDENT_AMBULATORY_CARE_PROVIDER_SITE_OTHER): Payer: Medicare Other | Admitting: Physician Assistant

## 2020-03-15 ENCOUNTER — Other Ambulatory Visit: Payer: Self-pay

## 2020-03-15 DIAGNOSIS — Z85820 Personal history of malignant melanoma of skin: Secondary | ICD-10-CM

## 2020-03-15 DIAGNOSIS — S90562A Insect bite (nonvenomous), left ankle, initial encounter: Secondary | ICD-10-CM

## 2020-03-15 DIAGNOSIS — L570 Actinic keratosis: Secondary | ICD-10-CM | POA: Diagnosis not present

## 2020-03-15 DIAGNOSIS — Z1283 Encounter for screening for malignant neoplasm of skin: Secondary | ICD-10-CM | POA: Diagnosis not present

## 2020-03-15 DIAGNOSIS — W57XXXA Bitten or stung by nonvenomous insect and other nonvenomous arthropods, initial encounter: Secondary | ICD-10-CM

## 2020-03-15 MED ORDER — TRIAMCINOLONE ACETONIDE 0.1 % EX CREA: 1.0000 "application " | TOPICAL_CREAM | Freq: Every day | CUTANEOUS | 3 refills | Status: DC

## 2020-03-15 NOTE — Progress Notes (Signed)
   Follow-Up Visit   Subjective  Elizabeth Armstrong is a 64 y.o. female who presents for the following: Annual Exam (general skin check). Has a history of MM and NMSC.   Location: [place] Duration: 6 mo plus Quality: pink scales Associated Signs/Symptoms:none Modifying Factors: persistent Severity: mild    The following portions of the chart were reviewed this encounter and updated as appropriate: Tobacco  Allergies  Meds  Problems  Med Hx  Surg Hx  Fam Hx      Objective  Well appearing patient in no apparent distress; mood and affect are within normal limits.  A full examination was performed including scalp, head, eyes, ears, nose, lips, neck, chest, axillae, abdomen, back, buttocks, bilateral upper extremities, bilateral lower extremities, hands, feet, fingers, toes, fingernails, and toenails. All findings within normal limits unless otherwise noted below.  Objective  Left Shoulder - Anterior, Left Upper Arm - Anterior (2), Neck - Anterior, Right Shoulder - Anterior, Right Upper Arm - Anterior (2): Erythematous patches with gritty scale.  Objective  Right Lower Back: Scar clear- no LAD   Assessment & Plan  AK (actinic keratosis) (7) Left Shoulder - Anterior; Right Shoulder - Anterior; Neck - Anterior; Left Upper Arm - Anterior (2); Right Upper Arm - Anterior (2)  Destruction of lesion - Left Shoulder - Anterior, Left Upper Arm - Anterior (2), Neck - Anterior, Right Shoulder - Anterior, Right Upper Arm - Anterior (2) Complexity: simple   Destruction method: cryotherapy   Informed consent: discussed and consent obtained   Timeout:  patient name, date of birth, surgical site, and procedure verified Lesion destroyed using liquid nitrogen: Yes   Cryotherapy cycles:  1 Outcome: patient tolerated procedure well with no complications   Post-procedure details: wound care instructions given    Insect bite of left ankle with local reaction, initial encounter Left Lower  Leg - Posterior  triamcinolone cream (KENALOG) 0.1 % - Left Lower Leg - Posterior  Personal history of malignant melanoma of skin Right Lower Back  Yearly skin examinations

## 2020-03-15 NOTE — Patient Instructions (Signed)
Can pick up Zeasorb AF (antifungal) from [employer].

## 2020-05-05 ENCOUNTER — Other Ambulatory Visit: Payer: Self-pay | Admitting: Family Medicine

## 2020-05-05 DIAGNOSIS — M1990 Unspecified osteoarthritis, unspecified site: Secondary | ICD-10-CM

## 2020-05-06 NOTE — Telephone Encounter (Signed)
Routed to pcp cma

## 2020-05-09 LAB — HM MAMMOGRAPHY

## 2020-05-12 ENCOUNTER — Encounter: Payer: Self-pay | Admitting: Family Medicine

## 2020-05-18 ENCOUNTER — Encounter: Payer: Self-pay | Admitting: Family Medicine

## 2020-05-23 ENCOUNTER — Telehealth: Payer: Self-pay | Admitting: *Deleted

## 2020-05-23 NOTE — Telephone Encounter (Signed)
   [facility] Pre-operative Risk Assessment    HEARTCARE STAFF: - Please ensure there is not already an duplicate clearance open for this procedure. - Under Visit Info/Reason for Call, type in Other and utilize the format Clearance MM/DD/YY or Clearance TBD. Do not use dashes or single digits. - If request is for dental extraction, please clarify the # of teeth to be extracted.  Request for surgical clearance:  1. What type of surgery is being performed? RIGHT TOTAL HIP ARTHROPLASTY   2. When is this surgery scheduled? 06/14/20   3. What type of clearance is required (medical clearance vs. Pharmacy clearance to hold med vs. Both)? MEDICAL  4. Are there any medications that need to be held prior to surgery and how long? NONE LISTED   5. Practice name and name of physician performing surgery? [facility]; DR. [place]   6. What is the office phone number? [place]   7.   What is the office fax number? [place]  8.   Anesthesia type (None, local, MAC, general) ? SPINAL   Julaine Hua 05/23/2020, 5:05 PM  _________________________________________________________________   (provider comments below)

## 2020-05-24 NOTE — Telephone Encounter (Signed)
   Primary Cardiologist: Mertie Moores, MD  Chart reviewed as part of pre-operative protocol coverage. Patient was contacted 05/24/2020 in reference to pre-operative risk assessment for pending surgery as outlined below.  Elizabeth Armstrong was last seen on 02/16/20 by Levell July, NP.  Since that day, Elizabeth Armstrong has done well. Limited activity due to hip pain but does stationary bike for 30 minutes 4 days/weeks without any cardiac symptoms.   Therefore, based on ACC/AHA guidelines, the patient would be at acceptable risk for the planned procedure without further cardiovascular testing.   I will route this recommendation to the requesting party via Epic fax function and remove from pre-op pool. Please call with questions.  [place], [place] 05/24/2020, 8:42 AM

## 2020-05-26 ENCOUNTER — Telehealth: Payer: Self-pay | Admitting: Family Medicine

## 2020-05-26 NOTE — Telephone Encounter (Signed)
Pt stated she is unable to make it on June 30th because she will be out of town. She is wondering if she can do a virtual on July 2nd however she mentioned to me she has surgery on July 13th but this was not in the appt notes. Sending message back for approval to do her visit virtually   Pt would like to be notified through my-chart

## 2020-05-27 NOTE — Telephone Encounter (Signed)
[place] for virtual since cardiology has already cleared her from cardiac standpoint. Only issue would be if surgeon is requiring additional evaluation - like bloodwork, etc that we cannot do virtually. We could however order this and have her complete after visit and prior to surgery.

## 2020-05-27 NOTE — Telephone Encounter (Signed)
Spoke with the pt and informed her of the message below.  Appt rescheduled for 7/9 per pts preference.

## 2020-06-01 ENCOUNTER — Ambulatory Visit: Payer: Medicare Other | Admitting: Family Medicine

## 2020-06-02 NOTE — Patient Instructions (Addendum)
DUE TO COVID-19 ONLY ONE VISITOR IS ALLOWED TO COME WITH YOU AND STAY IN THE WAITING ROOM ONLY DURING PRE OP AND PROCEDURE DAY OF SURGERY. THE 1 VISITOR MAY VISIT WITH YOU AFTER SURGERY IN YOUR PRIVATE ROOM DURING VISITING HOURS ONLY!  YOU NEED TO HAVE A COVID 19 TEST ON: 06/10/20 @ 3:00 pm, THIS TEST MUST BE DONE BEFORE SURGERY, COME  [address], [place] , [id number].  ([place]) ONCE YOUR COVID TEST IS COMPLETED, PLEASE BEGIN THE QUARANTINE INSTRUCTIONS AS OUTLINED IN YOUR HANDOUT.                [place]   Your procedure is scheduled on: 06/14/20   Report to [facility] Main  Entrance   Report to admitting at: 1:15 PM     Call this number if you have problems the morning of surgery [phone]    Remember:   NO SOLID FOOD AFTER MIDNIGHT THE NIGHT PRIOR TO SURGERY. NOTHING BY MOUTH EXCEPT CLEAR LIQUIDS UNTIL: 12:45 pm . PLEASE FINISH ENSURE DRINK PER SURGEON ORDER  WHICH NEEDS TO BE COMPLETED AT: 12:45 pm .   CLEAR LIQUID DIET   Foods Allowed                                                                     Foods Excluded  Coffee and tea, regular and decaf                             liquids that you cannot  Plain Jell-O any favor except red or purple                                           see through such as: Fruit ices (not with fruit pulp)                                     milk, soups, orange juice  Iced Popsicles                                    All solid food Carbonated beverages, regular and diet                                    Cranberry, grape and apple juices Sports drinks like Gatorade Lightly seasoned clear broth or consume(fat free) Sugar, honey syrup  Sample Menu Breakfast                                Lunch                                     Supper Cranberry juice  Beef broth                            Chicken broth Jell-O                                     Grape juice                            Apple juice Coffee or tea                        Jell-O                                      Popsicle                                                Coffee or tea                        Coffee or tea  _____________________________________________________________________  BRUSH YOUR TEETH MORNING OF SURGERY AND RINSE YOUR MOUTH OUT, NO CHEWING GUM CANDY OR MINTS.     Take these medicines the morning of surgery with A SIP OF WATER: carvedilol.                                 You may not have any metal on your body including hair pins and              piercings  Do not wear jewelry, make-up, lotions, powders or perfumes, deodorant             Do not wear nail polish on your fingernails.  Do not shave  48 hours prior to surgery.            Do not bring valuables to the hospital. [place].  Contacts, dentures or bridgework may not be worn into surgery.  Leave suitcase in the car. After surgery it may be brought to your room.     Patients discharged the day of surgery will not be allowed to drive home. IF YOU ARE HAVING SURGERY AND GOING HOME THE SAME DAY, YOU MUST HAVE AN ADULT TO DRIVE YOU HOME AND BE WITH YOU FOR 24 HOURS. YOU MAY GO HOME BY TAXI OR UBER OR ORTHERWISE, BUT AN ADULT MUST ACCOMPANY YOU HOME AND STAY WITH YOU FOR 24 HOURS.  Name and phone number of your driver:  Special Instructions: N/A              Please read over the following fact sheets you were given: _____________________________________________________________________  [facility] - Preparing for Surgery Before surgery, you can play an important role.  Because skin is not sterile, your skin needs to be as free of germs as possible.  You can reduce the number of germs on your skin by washing with CHG (chlorahexidine gluconate) soap before surgery.  CHG  is an antiseptic cleaner which kills germs and bonds with the skin to continue killing germs even after  washing. Please DO NOT use if you have an allergy to CHG or antibacterial soaps.  If your skin becomes reddened/irritated stop using the CHG and inform your nurse when you arrive at Short Stay. Do not shave (including legs and underarms) for at least 48 hours prior to the first CHG shower.  You may shave your face/neck. Please follow these instructions carefully:  1.  Shower with CHG Soap the night before surgery and the  morning of Surgery.  2.  If you choose to wash your hair, wash your hair first as usual with your  normal  shampoo.  3.  After you shampoo, rinse your hair and body thoroughly to remove the  shampoo.                           4.  Use CHG as you would any other liquid soap.  You can apply chg directly  to the skin and wash                       Gently with a scrungie or clean washcloth.  5.  Apply the CHG Soap to your body ONLY FROM THE NECK DOWN.   Do not use on face/ open                           Wound or open sores. Avoid contact with eyes, ears mouth and genitals (private parts).                       Wash face,  Genitals (private parts) with your normal soap.             6.  Wash thoroughly, paying special attention to the area where your surgery  will be performed.  7.  Thoroughly rinse your body with warm water from the neck down.  8.  DO NOT shower/wash with your normal soap after using and rinsing off  the CHG Soap.                9.  Pat yourself dry with a clean towel.            10.  Wear clean pajamas.            11.  Place clean sheets on your bed the night of your first shower and do not  sleep with pets. Day of Surgery : Do not apply any lotions/deodorants the morning of surgery.  Please wear clean clothes to the hospital/surgery center.  FAILURE TO FOLLOW THESE INSTRUCTIONS MAY RESULT IN THE CANCELLATION OF YOUR SURGERY PATIENT SIGNATURE_________________________________  NURSE  SIGNATURE__________________________________  ________________________________________________________________________   Adam Phenix  An incentive spirometer is a tool that can help keep your lungs clear and active. This tool measures how well you are filling your lungs with each breath. Taking long deep breaths may help reverse or decrease the chance of developing breathing (pulmonary) problems (especially infection) following:  A long period of time when you are unable to move or be active. BEFORE THE PROCEDURE   If the spirometer includes an indicator to show your best effort, your nurse or respiratory therapist will set it to a desired goal.  If possible, sit up straight or lean slightly forward. Try not to slouch.  Hold the incentive spirometer in an upright position. INSTRUCTIONS FOR USE  1. Sit on the edge of your bed if possible, or sit up as far as you can in bed or on a chair. 2. Hold the incentive spirometer in an upright position. 3. Breathe out normally. 4. Place the mouthpiece in your mouth and seal your lips tightly around it. 5. Breathe in slowly and as deeply as possible, raising the piston or the ball toward the top of the column. 6. Hold your breath for 3-5 seconds or for as long as possible. Allow the piston or ball to fall to the bottom of the column. 7. Remove the mouthpiece from your mouth and breathe out normally. 8. Rest for a few seconds and repeat Steps 1 through 7 at least 10 times every 1-2 hours when you are awake. Take your time and take a few normal breaths between deep breaths. 9. The spirometer may include an indicator to show your best effort. Use the indicator as a goal to work toward during each repetition. 10. After each set of 10 deep breaths, practice coughing to be sure your lungs are clear. If you have an incision (the cut made at the time of surgery), support your incision when coughing by placing a pillow or rolled up towels firmly  against it. Once you are able to get out of bed, walk around indoors and cough well. You may stop using the incentive spirometer when instructed by your caregiver.  RISKS AND COMPLICATIONS  Take your time so you do not get dizzy or light-headed.  If you are in pain, you may need to take or ask for pain medication before doing incentive spirometry. It is harder to take a deep breath if you are having pain. AFTER USE  Rest and breathe slowly and easily.  It can be helpful to keep track of a log of your progress. Your caregiver can provide you with a simple table to help with this. If you are using the spirometer at home, follow these instructions: [place] IF:   You are having difficultly using the spirometer.  You have trouble using the spirometer as often as instructed.  Your pain medication is not giving enough relief while using the spirometer.  You develop fever of 100.5 F (38.1 C) or higher. SEEK IMMEDIATE MEDICAL CARE IF:   You cough up bloody sputum that had not been present before.  You develop fever of 102 F (38.9 C) or greater.  You develop worsening pain at or near the incision site. MAKE SURE YOU:   Understand these instructions.  Will watch your condition.  Will get help right away if you are not doing well or get worse. Document Released: 04/01/2007 Document Revised: 02/11/2012 Document Reviewed: 06/02/2007 [facility] Patient Information 2014 [place], [place].   ________________________________________________________________________

## 2020-06-02 NOTE — H&P (Signed)
TOTAL HIP ADMISSION H&P  Patient is admitted for right total hip arthroplasty, anterior approach.  Subjective:  Chief Complaint:   Right hip primary OA / pain  HPI: Elizabeth Armstrong, 64 y.o. female, has a history of pain and functional disability in the right hip(s) due to arthritis and patient has failed non-surgical conservative treatments for greater than 12 weeks to include NSAID's and/or analgesics, corticosteriod injections and activity modification.  Onset of symptoms was gradual starting 1 year ago with gradually worsening course since that time.The patient noted no past surgery on the right hip(s).  Patient currently rates pain in the right hip at 8 out of 10 with activity. Patient has night pain, worsening of pain with activity and weight bearing, trendelenberg gait, pain that interfers with activities of daily living and pain with passive range of motion. Patient has evidence of periarticular osteophytes and joint space narrowing by imaging studies. This condition presents safety issues increasing the risk of falls.  There is no current active infection.  Risks, benefits and expectations were discussed with the patient.  Risks including but not limited to the risk of anesthesia, blood clots, nerve damage, blood vessel damage, failure of the prosthesis, infection and up to and including death.  Patient understand the risks, benefits and expectations and wishes to proceed with surgery.   PCP: Caren Macadam, MD  D/C Plans:       Home   Post-op Meds:       No Rx given  Tranexamic Acid:      To be given - IV   Decadron:      Is to be given  FYI:      ASA  Norco  DME:    Rx sent for - RW & 3-n-1  PT:   HEP  Pharmacy: [facility] [place]    Patient Active Problem List   Diagnosis Date Noted   B12 deficiency 04/17/2019   Osteoporosis 03/12/2017   Melanoma of skin ([place]) 08/25/2015   Palpitations 08/25/2015   Vaginal atrophy 08/25/2015   Essential hypertension  11/19/2014   Pure hypercholesterolemia 11/19/2014   Osteoarthritis 11/19/2014   Past Medical History:  Diagnosis Date   Atypical mole 04/11/2007   SEVERE LEFT MID BACK- WIDER SHAVE   Atypical mole 01/15/2014   MODERATE-LEFT THIGH UPPER- NO TREATMENT   Atypical mole 08/03/2015   MODERATE-LEFT LOWER BACK- NO TREATMENT   Atypical mole 08/03/2015   SEVERE-LEFT UPPER PARASPINAL- WIDER SHAVE   Atypical mole 08/03/2015   MILD-RIGHT INNER KNEE-NO TREATMENT   Atypical mole 11/15/2015   SEVERE-RIGHT POST THIGH-WIDER SHAVE   Atypical mole 12/07/2016   MILD-LEFT LOW BACK- NO TREATMENT   B12 deficiency 04/17/2019   Basal cell carcinoma 01/15/2014   nodular chest-cx3,5FU   BCC (basal cell carcinoma of skin) 03/07/2017   NODULAR CHEST-TX AFTER BIOPSY   Essential hypertension 11/19/2014   HTN (hypertension)    Hypercholesteremia    Melanoma in situ ([place]) 01/15/2014   LOWER LEFT BACK- MOHS DR. MERRITT   Melanoma of skin ([place]) 08/25/2015   sees dermatologist at least twice per year, Dr. Denna Haggard   Osteoarthritis 11/19/2014   Osteoarthrosis, unspecified whether generalized or localized, involving lower leg 11/19/2014   of knees, s/p knee replacements bilat, sees [facility] ortho   Osteopenia 01/04/2017   Palpitations 08/25/2015   Pure hypercholesterolemia 11/19/2014   SCCA (squamous cell carcinoma) of skin 11/13/2017   MID CHEST-C X 3, 5FU   Vaginal atrophy 08/25/2015    Past Surgical  History:  Procedure Laterality Date   APPENDECTOMY  2009   CESAREAN SECTION     JOINT REPLACEMENT Bilateral    knees   KNEE SURGERY  2009   left   OPEN REDUCTION INTERNAL FIXATION (ORIF) METACARPAL Left 06/19/2018   Procedure: LEFT SMALL FINGER OPEN REDUCTION INTERNAL FIXATION (ORIF) CARPOMETACARPAL FUSION;  Surgeon: Leanora Cover, MD;  Location: [facility];  Service: Orthopedics;  Laterality: Left;   WRIST SURGERY  2007   right    No current facility-administered  medications for this encounter.   Current Outpatient Medications  Medication Sig Dispense Refill Last Dose   carvedilol (COREG) 12.5 MG tablet Take 1 tablet (12.5 mg total) by mouth 2 (two) times daily. 180 tablet 3    Cholecalciferol (VITAMIN D3 PO) Take 1,000 Units by mouth daily.      conjugated estrogens (PREMARIN) vaginal cream INSERT 1 APPLICATORFUL VAGINALLY EVERY 15- 30 (THIRTY) DAYS. (Patient taking differently: Place 1 Applicatorful vaginally every 30 (thirty) days. INSERT 1 APPLICATORFUL VAGINALLY EVERY 15- 30 (THIRTY) DAYS.) 30 g 5    diclofenac (VOLTAREN) 75 MG EC tablet Take 75 mg by mouth 2 (two) times daily.      hydrochlorothiazide (HYDRODIURIL) 25 MG tablet Take 1 tablet (25 mg total) by mouth daily. 90 tablet 3    potassium chloride (KLOR-CON) 10 MEQ tablet Take 1 tablet (10 mEq total) by mouth daily. 90 tablet 3    triamcinolone cream (KENALOG) 0.1 % Apply 1 application topically daily. 453.6 g 3    vitamin B-12 (CYANOCOBALAMIN) 1000 MCG tablet Take 1,000 mcg by mouth daily.      Allergies  Allergen Reactions   Adhesive [Tape] Rash    Social History   Tobacco Use   Smoking status: Never Smoker   Smokeless tobacco: Never Used  Substance Use Topics   Alcohol use: Yes    Alcohol/week: 3.0 standard drinks    Types: 3 Glasses of wine per week    Comment: social    Family History  Problem Relation Age of Onset   Heart disease Other       Review of Systems  Constitutional: Negative.   HENT: Negative.   Eyes: Negative.   Respiratory: Negative.   Cardiovascular: Negative.   Gastrointestinal: Negative.   Genitourinary: Negative.   Musculoskeletal: Positive for joint pain.  Skin: Negative.   Neurological: Negative.   Endo/Heme/Allergies: Negative.   Psychiatric/Behavioral: Negative.      Objective:  Physical Exam Constitutional:      Appearance: She is well-developed.  HENT:     Head: Normocephalic.  Eyes:     Pupils: Pupils are equal,  round, and reactive to light.  Neck:     Thyroid: No thyromegaly.     Vascular: No JVD.     Trachea: No tracheal deviation.  Cardiovascular:     Rate and Rhythm: Normal rate and regular rhythm.     Heart sounds: Murmur heard.   Pulmonary:     Effort: Pulmonary effort is normal. No respiratory distress.     Breath sounds: Normal breath sounds. No wheezing.  Abdominal:     Palpations: Abdomen is soft.     Tenderness: There is no abdominal tenderness. There is no guarding.  Musculoskeletal:     Cervical back: Neck supple.     Right hip: Tenderness and bony tenderness present. Decreased range of motion. Decreased strength.  Lymphadenopathy:     Cervical: No cervical adenopathy.  Skin:    General: Skin is warm  and dry.  Neurological:     Mental Status: She is alert and oriented to person, place, and time.       Labs:  Estimated body mass index is 27.48 kg/m as calculated from the following:   Height as of 02/16/20: 5\' 4"  (1.626 m).   Weight as of 02/16/20: 72.6 kg.   Imaging Review Plain radiographs demonstrate severe degenerative joint disease of the right hip. The bone quality appears to be good for age and reported activity level.      Assessment/Plan:  End stage arthritis, right hip  The patient history, physical examination, clinical judgement of the provider and imaging studies are consistent with end stage degenerative joint disease of the right hip and total hip arthroplasty is deemed medically necessary. The treatment options including medical management, injection therapy, arthroscopy and arthroplasty were discussed at length. The risks and benefits of total hip arthroplasty were presented and reviewed. The risks due to aseptic loosening, infection, stiffness, dislocation/subluxation,  thromboembolic complications and other imponderables were discussed.  The patient acknowledged the explanation, agreed to proceed with the plan and consent was signed. Patient is  being admitted for inpatient treatment for surgery, pain control, PT, OT, prophylactic antibiotics, VTE prophylaxis, progressive ambulation and ADL's and discharge planning.The patient is planning to be discharged home.    [place] Jaxtin Raimondo   PA-C  06/02/2020, 8:52 AM

## 2020-06-03 ENCOUNTER — Encounter (HOSPITAL_COMMUNITY): Payer: Self-pay

## 2020-06-03 ENCOUNTER — Other Ambulatory Visit: Payer: Self-pay

## 2020-06-03 ENCOUNTER — Encounter (HOSPITAL_COMMUNITY)
Admission: RE | Admit: 2020-06-03 | Discharge: 2020-06-03 | Disposition: A | Discharge status: Home / Self Care | Payer: Medicare Other | Source: Ambulatory Visit | Attending: Orthopedic Surgery | Admitting: Orthopedic Surgery

## 2020-06-03 DIAGNOSIS — Z01812 Encounter for preprocedural laboratory examination: Secondary | ICD-10-CM | POA: Insufficient documentation

## 2020-06-03 HISTORY — DX: Cardiac murmur, unspecified: R01.1

## 2020-06-03 HISTORY — DX: Pneumonia, unspecified organism: J18.9

## 2020-06-03 LAB — SURGICAL PCR SCREEN
MRSA, PCR: NEGATIVE
Staphylococcus aureus: NEGATIVE

## 2020-06-03 LAB — CBC
HCT: 40.4 % (ref 36.0–46.0)
Hemoglobin: 13.2 g/dL (ref 12.0–15.0)
MCH: 33.2 pg (ref 26.0–34.0)
MCHC: 32.7 g/dL (ref 30.0–36.0)
MCV: 101.8 fL — ABNORMAL HIGH (ref 80.0–100.0)
Platelets: 249 10*3/uL (ref 150–400)
RBC: 3.97 MIL/uL (ref 3.87–5.11)
RDW: 13.4 % (ref 11.5–15.5)
WBC: 6.6 10*3/uL (ref 4.0–10.5)
nRBC: 0 % (ref 0.0–0.2)

## 2020-06-03 LAB — BASIC METABOLIC PANEL
Anion gap: 9 (ref 5–15)
BUN: 28 mg/dL — ABNORMAL HIGH (ref 8–23)
CO2: 27 mmol/L (ref 22–32)
Calcium: 9.3 mg/dL (ref 8.9–10.3)
Chloride: 97 mmol/L — ABNORMAL LOW (ref 98–111)
Creatinine, Ser: 1.09 mg/dL — ABNORMAL HIGH (ref 0.44–1.00)
GFR calc Af Amer: >60 mL/min (ref >60)
GFR calc non Af Amer: 53 mL/min — ABNORMAL LOW (ref >60)
Glucose, Bld: 99 mg/dL (ref 70–99)
Potassium: 4.1 mmol/L (ref 3.5–5.1)
Sodium: 133 mmol/L — ABNORMAL LOW (ref 135–145)

## 2020-06-03 LAB — TYPE AND SCREEN
ABO/RH(D): A POS
Antibody Screen: NEGATIVE

## 2020-06-03 NOTE — Progress Notes (Addendum)
COVID Vaccine Completed:yes Date COVID Vaccine completed:02/03/20 COVID vaccine manufacturer: *[employer]    [employer]   PCP - Dr. Micheline Rough. LOV: 02/16/20.  Cardiologist - Mertie Moores.Clearance: Bhagat Bhavinkumar. PA:05/23/20  Chest x-ray -  EKG - 02/16/20 EPIC Stress Test -  ECHO -  Cardiac Cath -   Sleep Study -  CPAP -   Fasting Blood Sugar -  Checks Blood Sugar _____ times a day  Blood Thinner Instructions: Aspirin Instructions: Last Dose:  Anesthesia review: Hx: HTN,palpitations  Patient denies shortness of breath, fever, cough and chest pain at PAT appointment   Patient verbalized understanding of instructions that were given to them at the PAT appointment. Patient was also instructed that they will need to review over the PAT instructions again at home before surgery.

## 2020-06-10 ENCOUNTER — Telehealth (INDEPENDENT_AMBULATORY_CARE_PROVIDER_SITE_OTHER): Payer: Medicare Other | Admitting: Family Medicine

## 2020-06-10 ENCOUNTER — Encounter: Payer: Self-pay | Admitting: Family Medicine

## 2020-06-10 ENCOUNTER — Other Ambulatory Visit (HOSPITAL_COMMUNITY)
Admission: RE | Admit: 2020-06-10 | Discharge: 2020-06-10 | Disposition: A | Discharge status: Home / Self Care | Payer: Medicare Other | Source: Ambulatory Visit | Attending: Orthopedic Surgery | Admitting: Orthopedic Surgery

## 2020-06-10 VITALS — BP 133/82 | Temp 98.4°F | Wt 152.0 lb

## 2020-06-10 DIAGNOSIS — Z01812 Encounter for preprocedural laboratory examination: Secondary | ICD-10-CM | POA: Insufficient documentation

## 2020-06-10 DIAGNOSIS — E538 Deficiency of other specified B group vitamins: Secondary | ICD-10-CM | POA: Diagnosis not present

## 2020-06-10 DIAGNOSIS — E7800 Pure hypercholesterolemia, unspecified: Secondary | ICD-10-CM

## 2020-06-10 DIAGNOSIS — R7989 Other specified abnormal findings of blood chemistry: Secondary | ICD-10-CM | POA: Diagnosis not present

## 2020-06-10 DIAGNOSIS — Z20822 Contact with and (suspected) exposure to covid-19: Secondary | ICD-10-CM | POA: Insufficient documentation

## 2020-06-10 DIAGNOSIS — I10 Essential (primary) hypertension: Secondary | ICD-10-CM

## 2020-06-10 DIAGNOSIS — M1990 Unspecified osteoarthritis, unspecified site: Secondary | ICD-10-CM

## 2020-06-10 DIAGNOSIS — N952 Postmenopausal atrophic vaginitis: Secondary | ICD-10-CM

## 2020-06-10 DIAGNOSIS — M810 Age-related osteoporosis without current pathological fracture: Secondary | ICD-10-CM

## 2020-06-10 LAB — SARS CORONAVIRUS 2 (TAT 6-24 HRS): SARS Coronavirus 2: NEGATIVE

## 2020-06-10 NOTE — Progress Notes (Signed)
Virtual Visit via Video Note  I connected with Elizabeth Armstrong  on 06/10/20 at 11:00 AM EDT by a video enabled telemedicine application and verified that I am speaking with the correct person using two identifiers.  Location patient: home Location provider:work or home office Persons participating in the virtual visit: patient, provider  I discussed the limitations of evaluation and management by telemedicine and the availability of in person appointments. The patient expressed understanding and agreed to proceed.   Elizabeth Armstrong DOB: 10-22-1956 Encounter date: 06/10/2020  This is a 64 y.o. female who presents with Chief Complaint  Patient presents with  . Follow-up    History of present illness: Ready to get through next week and complete hip replacement. Ready to get life back. Has been trying to lose weight. Wanting to lose 10 more pounds. Eating healthier; cutting out excess. Swimming when nice out. Does play golf a couple of times/week.    Osteoporosis: dexa 03/2017; does not want medications. Vitamin D, weight bearing exercise  HTN: amlodipine; doesn't check bp anymore. Has been good at recent doc visits.  Currently taking carvedilol and hydrochlorothiazide.  She would like to lose weight and get off of medications if possible.  B12 def: supplementation since 2019  Hx of melanoma: sees derm twice yearly. Saw Claib[facility] Billings just a couple of weeks ago.   Osteoarthritis knees/back; s/p bilat knee replacement: followed at [facility] for knee replacements.  PNA vaccination: will complete today  Last mammogram completed in June and was normal.  Colonoscopy due 04/2021 (3 year follow up)  No specific concerns today except back pain. Has some inserts in shoes down several years ago and trying to be careful with shoe choice, etc. Just nagging back ache playing pickle ball, tennis, golf. Takes tylenol when needed; occasional advil pm. Took celebrex after knee surgeries, but didn't take  daily. Tylenol doesn't really help.   Tries to eat right.   Does the premarin about once a week. Still using this but only every 6-8 weeks. For vaginal atrophy  Allergies  Allergen Reactions  . Adhesive [Tape] Rash   Current Meds  Medication Sig  . carvedilol (COREG) 12.5 MG tablet Take 1 tablet (12.5 mg total) by mouth 2 (two) times daily.  . Cholecalciferol (VITAMIN D3 PO) Take 1,000 Units by mouth daily.  . diclofenac (VOLTAREN) 75 MG EC tablet Take 75 mg by mouth 2 (two) times daily.  . hydrochlorothiazide (HYDRODIURIL) 25 MG tablet Take 1 tablet (25 mg total) by mouth daily.  . potassium chloride (KLOR-CON) 10 MEQ tablet Take 1 tablet (10 mEq total) by mouth daily.  Marland Kitchen triamcinolone cream (KENALOG) 0.1 % Apply 1 application topically daily.  . vitamin B-12 (CYANOCOBALAMIN) 1000 MCG tablet Take 1,000 mcg by mouth daily.  . [DISCONTINUED] conjugated estrogens (PREMARIN) vaginal cream INSERT 1 APPLICATORFUL VAGINALLY EVERY 15- 30 (THIRTY) DAYS. (Patient taking differently: Place 1 Applicatorful vaginally every 30 (thirty) days. INSERT 1 APPLICATORFUL VAGINALLY EVERY 15- 30 (THIRTY) DAYS.)    Review of Systems  Constitutional: Negative for chills, fatigue and fever.  Respiratory: Negative for cough, chest tightness, shortness of breath and wheezing.   Cardiovascular: Negative for chest pain, palpitations and leg swelling.    Objective:  BP 133/82   Temp 98.4 F (36.9 C)   Wt 152 lb (68.9 kg)   BMI 25.69 kg/m   Weight: 152 lb (68.9 kg)   BP Readings from Last 3 Encounters:  06/10/20 133/82  06/03/20 132/78  02/16/20 118/62  Wt Readings from Last 3 Encounters:  06/10/20 152 lb (68.9 kg)  06/03/20 155 lb (70.3 kg)  02/16/20 160 lb 1.9 oz (72.6 kg)    EXAM:  GENERAL: alert, oriented, appears well and in no acute distress  HEENT: atraumatic, conjunctiva clear, no obvious abnormalities on inspection of external nose and ears  NECK: normal movements of the head and  neck  LUNGS: on inspection no signs of respiratory distress, breathing rate appears normal, no obvious gross SOB, gasping or wheezing  CV: no obvious cyanosis  MS: moves all visible extremities without noticeable abnormality  PSYCH/NEURO: pleasant and cooperative, no obvious depression or anxiety, speech and thought processing grossly intact   Assessment/Plan   1. Elevated serum creatinine We will plan to recheck blood work after surgery.  We discussed importance of staying well-hydrated.  She will continue to work on this.  We also discussed things that can elevate serum creatinine including her blood pressure medication as well as anti-inflammatories.  I suspect she will be able to cut back on blood pressure medication as she continues to lose weight and becomes more active status post hip replacement.  Additionally, she does not feel she will need the anti-inflammatory once she recovers from hip surgery. - Comprehensive metabolic panel; Future  2. Essential hypertension Well-controlled.  Continue current medications for now. - Comprehensive metabolic panel; Future - CBC with Differential/Platelet; Future  3. B12 deficiency Continue current supplement.  Patient does desire to get off of all supplements, so we can continue to monitor levels. - Vitamin B12; Future  4. Vaginal atrophy Occasional use of Premarin.  Uses sparingly and does help when needed.  5. Osteoporosis, unspecified osteoporosis type, unspecified pathological fracture presence Taking vitamin D regularly.  And keeping up with weightbearing exercise.  6. Pure hypercholesterolemia Has been diet controlled.  We will plan to recheck after surgery. - Comprehensive metabolic panel; Future - Lipid panel; Future  7. Osteoarthritis, unspecified osteoarthritis type, unspecified site She is getting hip replacement on Tuesday.  She is looking forward to this and getting back to more regular activities after recovery.   I  discussed the assessment and treatment plan with the patient. The patient was provided an opportunity to ask questions and all were answered. The patient agreed with the plan and demonstrated an understanding of the instructions.   The patient was advised to call back or seek an in-person evaluation if the symptoms worsen or if the condition fails to improve as anticipated.  I provided 25 minutes of non-face-to-face time during this encounter.   Micheline Rough, MD

## 2020-06-13 ENCOUNTER — Telehealth: Payer: Self-pay | Admitting: *Deleted

## 2020-06-13 NOTE — Telephone Encounter (Signed)
-----   Message from Caren Macadam, MD sent at 06/10/2020 12:39 PM EDT ----- Please set up lab visit for her in 3-4 weeks time

## 2020-06-13 NOTE — Telephone Encounter (Signed)
Spoke with the pt and scheduled an appt for 8/13.

## 2020-06-14 ENCOUNTER — Ambulatory Visit (HOSPITAL_COMMUNITY): Payer: Medicare Other

## 2020-06-14 ENCOUNTER — Observation Stay (HOSPITAL_COMMUNITY)
Admission: RE | Admit: 2020-06-14 | Discharge: 2020-06-15 | Disposition: A | Discharge status: Home / Self Care | Payer: Medicare Other | Attending: Orthopedic Surgery | Admitting: Orthopedic Surgery

## 2020-06-14 ENCOUNTER — Ambulatory Visit (HOSPITAL_COMMUNITY): Payer: Medicare Other | Admitting: Anesthesiology

## 2020-06-14 ENCOUNTER — Other Ambulatory Visit: Payer: Self-pay

## 2020-06-14 ENCOUNTER — Encounter (HOSPITAL_COMMUNITY)
Admission: RE | Disposition: A | Discharge status: Home / Self Care | Payer: Self-pay | Source: Home / Self Care | Attending: Orthopedic Surgery

## 2020-06-14 ENCOUNTER — Encounter (HOSPITAL_COMMUNITY): Payer: Self-pay | Admitting: Orthopedic Surgery

## 2020-06-14 ENCOUNTER — Observation Stay (HOSPITAL_COMMUNITY): Payer: Medicare Other

## 2020-06-14 DIAGNOSIS — M1611 Unilateral primary osteoarthritis, right hip: Secondary | ICD-10-CM | POA: Diagnosis present

## 2020-06-14 DIAGNOSIS — I10 Essential (primary) hypertension: Secondary | ICD-10-CM | POA: Diagnosis not present

## 2020-06-14 DIAGNOSIS — Z96641 Presence of right artificial hip joint: Secondary | ICD-10-CM

## 2020-06-14 DIAGNOSIS — Z419 Encounter for procedure for purposes other than remedying health state, unspecified: Secondary | ICD-10-CM

## 2020-06-14 DIAGNOSIS — Z96649 Presence of unspecified artificial hip joint: Secondary | ICD-10-CM

## 2020-06-14 DIAGNOSIS — E663 Overweight: Secondary | ICD-10-CM | POA: Diagnosis present

## 2020-06-14 HISTORY — PX: TOTAL HIP ARTHROPLASTY: SHX124

## 2020-06-14 LAB — ABO/RH: ABO/RH(D): A POS

## 2020-06-14 SURGERY — ARTHROPLASTY, HIP, TOTAL, ANTERIOR APPROACH
Anesthesia: Spinal | Site: Hip | Laterality: Right

## 2020-06-14 MED ORDER — METHOCARBAMOL 500 MG IVPB - SIMPLE MED
INTRAVENOUS | Status: AC
  Filled 2020-06-14: qty 50

## 2020-06-14 MED ORDER — ALUM & MAG HYDROXIDE-SIMETH 200-200-20 MG/5ML PO SUSP: 15.0000 mL | ORAL | Status: DC | PRN

## 2020-06-14 MED ORDER — HYDROCODONE-ACETAMINOPHEN 7.5-325 MG PO TABS
1.0000 | ORAL_TABLET | ORAL | Status: DC | PRN
  Administered 2020-06-15: 1 via ORAL
  Filled 2020-06-14: qty 1

## 2020-06-14 MED ORDER — POLYETHYLENE GLYCOL 3350 17 G PO PACK
17.0000 g | PACK | Freq: Two times a day (BID) | ORAL | Status: DC
  Administered 2020-06-15: 17 g via ORAL
  Filled 2020-06-14 (×2): qty 1

## 2020-06-14 MED ORDER — PROPOFOL 500 MG/50ML IV EMUL
INTRAVENOUS | Status: AC
  Filled 2020-06-14: qty 50

## 2020-06-14 MED ORDER — BUPIVACAINE IN DEXTROSE 0.75-8.25 % IT SOLN
INTRATHECAL | Status: DC | PRN
  Administered 2020-06-14: 1.6 mL via INTRATHECAL

## 2020-06-14 MED ORDER — MEPERIDINE HCL 50 MG/ML IJ SOLN: 6.2500 mg | INTRAMUSCULAR | Status: DC | PRN

## 2020-06-14 MED ORDER — METOCLOPRAMIDE HCL 5 MG/ML IJ SOLN: 5.0000 mg | Freq: Three times a day (TID) | INTRAMUSCULAR | Status: DC | PRN

## 2020-06-14 MED ORDER — HYDROCODONE-ACETAMINOPHEN 5-325 MG PO TABS
ORAL_TABLET | ORAL | Status: AC
  Filled 2020-06-14: qty 1

## 2020-06-14 MED ORDER — CEFAZOLIN SODIUM-DEXTROSE 2-4 GM/100ML-% IV SOLN
2.0000 g | Freq: Four times a day (QID) | INTRAVENOUS | Status: AC
  Administered 2020-06-14 – 2020-06-15 (×2): 2 g via INTRAVENOUS
  Filled 2020-06-14 (×2): qty 100

## 2020-06-14 MED ORDER — KETOROLAC TROMETHAMINE 30 MG/ML IJ SOLN: 30.0000 mg | Freq: Once | INTRAMUSCULAR | Status: DC | PRN

## 2020-06-14 MED ORDER — POTASSIUM CHLORIDE ER 10 MEQ PO TBCR
10.0000 meq | EXTENDED_RELEASE_TABLET | Freq: Every day | ORAL | Status: DC
  Administered 2020-06-15: 10 meq via ORAL
  Filled 2020-06-14 (×2): qty 1

## 2020-06-14 MED ORDER — MENTHOL 3 MG MT LOZG: 1.0000 | LOZENGE | OROMUCOSAL | Status: DC | PRN

## 2020-06-14 MED ORDER — EPHEDRINE SULFATE-NACL 50-0.9 MG/10ML-% IV SOSY
PREFILLED_SYRINGE | INTRAVENOUS | Status: DC | PRN
  Administered 2020-06-14 (×2): 10 mg via INTRAVENOUS

## 2020-06-14 MED ORDER — LIDOCAINE 2% (20 MG/ML) 5 ML SYRINGE
INTRAMUSCULAR | Status: AC
  Filled 2020-06-14: qty 15

## 2020-06-14 MED ORDER — CEFAZOLIN SODIUM-DEXTROSE 2-4 GM/100ML-% IV SOLN
2.0000 g | INTRAVENOUS | Status: AC
  Administered 2020-06-14: 2 g via INTRAVENOUS
  Filled 2020-06-14: qty 100

## 2020-06-14 MED ORDER — TRANEXAMIC ACID-NACL 1000-0.7 MG/100ML-% IV SOLN
1000.0000 mg | INTRAVENOUS | Status: AC
  Administered 2020-06-14: 1000 mg via INTRAVENOUS
  Filled 2020-06-14: qty 100

## 2020-06-14 MED ORDER — DIPHENHYDRAMINE HCL 12.5 MG/5ML PO ELIX: 12.5000 mg | ORAL_SOLUTION | ORAL | Status: DC | PRN

## 2020-06-14 MED ORDER — PROPOFOL 10 MG/ML IV BOLUS
INTRAVENOUS | Status: AC
  Filled 2020-06-14: qty 20

## 2020-06-14 MED ORDER — METHOCARBAMOL 500 MG PO TABS: 500.0000 mg | ORAL_TABLET | Freq: Four times a day (QID) | ORAL | Status: DC | PRN

## 2020-06-14 MED ORDER — CELECOXIB 200 MG PO CAPS
200.0000 mg | ORAL_CAPSULE | Freq: Two times a day (BID) | ORAL | Status: DC
  Administered 2020-06-14 – 2020-06-15 (×2): 200 mg via ORAL
  Filled 2020-06-14 (×2): qty 1

## 2020-06-14 MED ORDER — METOCLOPRAMIDE HCL 5 MG PO TABS: 5.0000 mg | ORAL_TABLET | Freq: Three times a day (TID) | ORAL | Status: DC | PRN

## 2020-06-14 MED ORDER — CHLORHEXIDINE GLUCONATE 0.12 % MT SOLN
15.0000 mL | Freq: Once | OROMUCOSAL | Status: AC
  Administered 2020-06-14: 15 mL via OROMUCOSAL

## 2020-06-14 MED ORDER — HYDROMORPHONE HCL 1 MG/ML IJ SOLN
INTRAMUSCULAR | Status: AC
  Filled 2020-06-14: qty 1

## 2020-06-14 MED ORDER — LACTATED RINGERS IV SOLN: INTRAVENOUS | Status: DC

## 2020-06-14 MED ORDER — HYDROCHLOROTHIAZIDE 25 MG PO TABS
25.0000 mg | ORAL_TABLET | Freq: Every day | ORAL | Status: DC
  Administered 2020-06-15: 25 mg via ORAL
  Filled 2020-06-14: qty 1

## 2020-06-14 MED ORDER — HYDROCODONE-ACETAMINOPHEN 5-325 MG PO TABS
1.0000 | ORAL_TABLET | ORAL | Status: DC | PRN
  Administered 2020-06-14 – 2020-06-15 (×2): 1 via ORAL
  Filled 2020-06-14: qty 1

## 2020-06-14 MED ORDER — FERROUS SULFATE 325 (65 FE) MG PO TABS
325.0000 mg | ORAL_TABLET | Freq: Three times a day (TID) | ORAL | Status: DC
  Administered 2020-06-15: 325 mg via ORAL
  Filled 2020-06-14: qty 1

## 2020-06-14 MED ORDER — FENTANYL CITRATE (PF) 100 MCG/2ML IJ SOLN
INTRAMUSCULAR | Status: AC
  Filled 2020-06-14: qty 2

## 2020-06-14 MED ORDER — ORAL CARE MOUTH RINSE: 15.0000 mL | Freq: Once | OROMUCOSAL | Status: AC

## 2020-06-14 MED ORDER — PROMETHAZINE HCL 25 MG/ML IJ SOLN: 6.2500 mg | INTRAMUSCULAR | Status: DC | PRN

## 2020-06-14 MED ORDER — DOCUSATE SODIUM 100 MG PO CAPS
100.0000 mg | ORAL_CAPSULE | Freq: Two times a day (BID) | ORAL | Status: DC
  Administered 2020-06-14 – 2020-06-15 (×2): 100 mg via ORAL
  Filled 2020-06-14 (×2): qty 1

## 2020-06-14 MED ORDER — ONDANSETRON HCL 4 MG/2ML IJ SOLN: 4.0000 mg | Freq: Four times a day (QID) | INTRAMUSCULAR | Status: DC | PRN

## 2020-06-14 MED ORDER — LACTATED RINGERS IV SOLN: INTRAVENOUS | Status: DC | PRN

## 2020-06-14 MED ORDER — HYDROMORPHONE HCL 1 MG/ML IJ SOLN
0.2500 mg | INTRAMUSCULAR | Status: DC | PRN
  Administered 2020-06-14 (×2): 0.5 mg via INTRAVENOUS

## 2020-06-14 MED ORDER — HYDROMORPHONE HCL 1 MG/ML IJ SOLN
0.5000 mg | INTRAMUSCULAR | Status: DC | PRN
  Administered 2020-06-14: 1 mg via INTRAVENOUS
  Filled 2020-06-14 (×2): qty 1

## 2020-06-14 MED ORDER — DEXAMETHASONE SODIUM PHOSPHATE 10 MG/ML IJ SOLN: 10.0000 mg | Freq: Once | INTRAMUSCULAR | Status: AC

## 2020-06-14 MED ORDER — PROPOFOL 500 MG/50ML IV EMUL
INTRAVENOUS | Status: DC | PRN
  Administered 2020-06-14: 75 ug/kg/min via INTRAVENOUS
  Administered 2020-06-14: 50 mg via INTRAVENOUS

## 2020-06-14 MED ORDER — PHENYLEPHRINE 40 MCG/ML (10ML) SYRINGE FOR IV PUSH (FOR BLOOD PRESSURE SUPPORT)
PREFILLED_SYRINGE | INTRAVENOUS | Status: AC
  Filled 2020-06-14: qty 10

## 2020-06-14 MED ORDER — ASPIRIN 81 MG PO CHEW
81.0000 mg | CHEWABLE_TABLET | Freq: Two times a day (BID) | ORAL | Status: DC
  Administered 2020-06-14 – 2020-06-15 (×2): 81 mg via ORAL
  Filled 2020-06-14 (×2): qty 1

## 2020-06-14 MED ORDER — TRANEXAMIC ACID-NACL 1000-0.7 MG/100ML-% IV SOLN
1000.0000 mg | Freq: Once | INTRAVENOUS | Status: AC
  Administered 2020-06-14: 1000 mg via INTRAVENOUS
  Filled 2020-06-14: qty 100

## 2020-06-14 MED ORDER — BISACODYL 10 MG RE SUPP: 10.0000 mg | Freq: Every day | RECTAL | Status: DC | PRN

## 2020-06-14 MED ORDER — ONDANSETRON HCL 4 MG PO TABS: 4.0000 mg | ORAL_TABLET | Freq: Four times a day (QID) | ORAL | Status: DC | PRN

## 2020-06-14 MED ORDER — PHENOL 1.4 % MT LIQD: 1.0000 | OROMUCOSAL | Status: DC | PRN

## 2020-06-14 MED ORDER — SODIUM CHLORIDE 0.9 % IV SOLN: INTRAVENOUS | Status: DC

## 2020-06-14 MED ORDER — FENTANYL CITRATE (PF) 100 MCG/2ML IJ SOLN
INTRAMUSCULAR | Status: DC | PRN
  Administered 2020-06-14: 100 ug via INTRAVENOUS

## 2020-06-14 MED ORDER — DEXAMETHASONE SODIUM PHOSPHATE 10 MG/ML IJ SOLN
INTRAMUSCULAR | Status: DC | PRN
  Administered 2020-06-14: 10 mg via INTRAVENOUS

## 2020-06-14 MED ORDER — CARVEDILOL 12.5 MG PO TABS
12.5000 mg | ORAL_TABLET | Freq: Two times a day (BID) | ORAL | Status: DC
  Administered 2020-06-14 – 2020-06-15 (×2): 12.5 mg via ORAL
  Filled 2020-06-14 (×2): qty 1

## 2020-06-14 MED ORDER — ACETAMINOPHEN 325 MG PO TABS: 325.0000 mg | ORAL_TABLET | Freq: Four times a day (QID) | ORAL | Status: DC | PRN

## 2020-06-14 MED ORDER — SODIUM CHLORIDE 0.9 % IR SOLN
Status: DC | PRN
  Administered 2020-06-14: 1000 mL

## 2020-06-14 MED ORDER — METHOCARBAMOL 500 MG IVPB - SIMPLE MED
500.0000 mg | Freq: Four times a day (QID) | INTRAVENOUS | Status: DC | PRN
  Administered 2020-06-14: 500 mg via INTRAVENOUS
  Filled 2020-06-14: qty 50

## 2020-06-14 MED ORDER — MAGNESIUM CITRATE PO SOLN: 1.0000 | Freq: Once | ORAL | Status: DC | PRN

## 2020-06-14 MED ORDER — DEXAMETHASONE SODIUM PHOSPHATE 10 MG/ML IJ SOLN
10.0000 mg | Freq: Once | INTRAMUSCULAR | Status: AC
  Administered 2020-06-15: 10 mg via INTRAVENOUS
  Filled 2020-06-14: qty 1

## 2020-06-14 SURGICAL SUPPLY — 49 items
BAG DECANTER FOR FLEXI CONT (MISCELLANEOUS) IMPLANT
BAG ZIPLOCK 12X15 (MISCELLANEOUS) IMPLANT
BALL HIP CERAMIC (Hips) ×1 IMPLANT
BLADE SAG 18X100X1.27 (BLADE) ×2 IMPLANT
BLADE SURG SZ10 CARB STEEL (BLADE) ×4 IMPLANT
COVER PERINEAL POST (MISCELLANEOUS) ×2 IMPLANT
COVER SURGICAL LIGHT HANDLE (MISCELLANEOUS) ×2 IMPLANT
COVER WAND RF STERILE (DRAPES) IMPLANT
CUP ACET PINNACLE SECTR 50MM (Hips) ×1 IMPLANT
DERMABOND ADVANCED (GAUZE/BANDAGES/DRESSINGS) ×1
DERMABOND ADVANCED .7 DNX12 (GAUZE/BANDAGES/DRESSINGS) ×1 IMPLANT
DRAPE STERI IOBAN 125X83 (DRAPES) ×2 IMPLANT
DRAPE U-SHAPE 47X51 STRL (DRAPES) ×4 IMPLANT
DRESSING AQUACEL AG SP 3.5X10 (GAUZE/BANDAGES/DRESSINGS) ×1 IMPLANT
DRSG AQUACEL AG ADV 3.5X10 (GAUZE/BANDAGES/DRESSINGS) ×2 IMPLANT
DRSG AQUACEL AG SP 3.5X10 (GAUZE/BANDAGES/DRESSINGS) ×2
DURAPREP 26ML APPLICATOR (WOUND CARE) ×2 IMPLANT
ELECT REM PT RETURN 15FT ADLT (MISCELLANEOUS) ×2 IMPLANT
ELIMINATOR HOLE APEX DEPUY (Hips) ×2 IMPLANT
GLOVE BIO SURGEON STRL SZ 6 (GLOVE) ×4 IMPLANT
GLOVE BIOGEL PI IND STRL 6.5 (GLOVE) ×1 IMPLANT
GLOVE BIOGEL PI IND STRL 7.5 (GLOVE) ×1 IMPLANT
GLOVE BIOGEL PI IND STRL 8.5 (GLOVE) ×1 IMPLANT
GLOVE BIOGEL PI INDICATOR 6.5 (GLOVE) ×1
GLOVE BIOGEL PI INDICATOR 7.5 (GLOVE) ×1
GLOVE BIOGEL PI INDICATOR 8.5 (GLOVE) ×1
GLOVE ECLIPSE 8.0 STRL XLNG CF (GLOVE) ×4 IMPLANT
GLOVE ORTHO TXT STRL SZ7.5 (GLOVE) ×4 IMPLANT
GOWN STRL REUS W/TWL LRG LVL3 (GOWN DISPOSABLE) ×4 IMPLANT
GOWN STRL REUS W/TWL XL LVL3 (GOWN DISPOSABLE) ×2 IMPLANT
HIP BALL CERAMIC (Hips) ×2 IMPLANT
HOLDER FOLEY CATH W/STRAP (MISCELLANEOUS) ×2 IMPLANT
KIT TURNOVER KIT A (KITS) IMPLANT
LINER ACET PNNCL PLUS4 NEUTRAL (Hips) ×1 IMPLANT
PACK ANTERIOR HIP CUSTOM (KITS) ×2 IMPLANT
PENCIL SMOKE EVACUATOR (MISCELLANEOUS) IMPLANT
PINNACLE PLUS 4 NEUTRAL (Hips) ×2 IMPLANT
PINNACLE SECTOR CUP 50MM (Hips) ×2 IMPLANT
SCREW PINN CAN 6.5X20 (Screw) ×2 IMPLANT
STEM FEMORAL SZ 5MM STD ACTIS (Stem) ×2 IMPLANT
SUT MNCRL AB 4-0 PS2 18 (SUTURE) ×2 IMPLANT
SUT STRATAFIX 0 PDS 27 VIOLET (SUTURE) ×2
SUT VIC AB 1 CT1 36 (SUTURE) ×6 IMPLANT
SUT VIC AB 2-0 CT1 27 (SUTURE) ×4
SUT VIC AB 2-0 CT1 TAPERPNT 27 (SUTURE) ×2 IMPLANT
SUTURE STRATFX 0 PDS 27 VIOLET (SUTURE) ×1 IMPLANT
TRAY FOLEY MTR SLVR 16FR STAT (SET/KITS/TRAYS/PACK) IMPLANT
WATER STERILE IRR 1000ML POUR (IV SOLUTION) ×2 IMPLANT
YANKAUER SUCT BULB TIP 10FT TU (MISCELLANEOUS) IMPLANT

## 2020-06-14 NOTE — Op Note (Signed)
NAME:  Elizabeth Armstrong [facility]                ACCOUNT NO.: [id number]      MEDICAL RECORD NO.: [id number]      FACILITY:  [facility]      PHYSICIAN:  Mauri Pole  DATE OF BIRTH:  [birth year]     DATE OF PROCEDURE:  06/14/2020                                 OPERATIVE REPORT         PREOPERATIVE DIAGNOSIS: Right  hip osteoarthritis.      POSTOPERATIVE DIAGNOSIS:  Right hip osteoarthritis.      PROCEDURE:  Right total hip replacement through an anterior approach   utilizing DePuy THR system, component size 50 mm pinnacle cup, a size 32+4 neutral   Altrex liner, a size 5 standard Actis stem with a 32+5 delta ceramic   ball.      SURGEON:  Pietro Cassis. Alvan Dame, M.D.      ASSISTANT:  Griffith Citron, PA-C     ANESTHESIA:  Spinal.      SPECIMENS:  None.      COMPLICATIONS:  None.      BLOOD LOSS:  250 cc     DRAINS:  None.      INDICATION OF THE PROCEDURE:  Elizabeth Armstrong is a 64 y.o. female who had   presented to office for evaluation of right hip pain.  Radiographs revealed   progressive degenerative changes with bone-on-bone   articulation of the  hip joint, including subchondral cystic changes and osteophytes.  The patient had painful limited range of   motion significantly affecting their overall quality of life and function.  The patient was failing to    respond to conservative measures including medications and/or injections and activity modification and at this point was ready   to proceed with more definitive measures.  Consent was obtained for   benefit of pain relief.  Specific risks of infection, DVT, component   failure, dislocation, neurovascular injury, and need for revision surgery were reviewed in the office as well discussion of   the anterior versus posterior approach were reviewed.     PROCEDURE IN DETAIL:  The patient was brought to operative theater.   Once adequate anesthesia, preoperative antibiotics, 2 gm of Ancef, 1 gm of Tranexamic  Acid, and 10 mg of Decadron were administered, the patient was positioned supine on the [employer] table.  Once the patient was safely positioned with adequate padding of boney prominences we predraped out the hip, and used fluoroscopy to confirm orientation of the pelvis.      The right hip was then prepped and draped from proximal iliac crest to   mid thigh with a shower curtain technique.      Time-out was performed identifying the patient, planned procedure, and the appropriate extremity.     An incision was then made 2 cm lateral to the   anterior superior iliac spine extending over the orientation of the   tensor fascia lata muscle and sharp dissection was carried down to the   fascia of the muscle.      The fascia was then incised.  The muscle belly was identified and swept   laterally and retractor placed along the superior neck.  Following   cauterization of the circumflex vessels and removing some  pericapsular   fat, a second cobra retractor was placed on the inferior neck.  A T-capsulotomy was made along the line of the   superior neck to the trochanteric fossa, then extended proximally and   distally.  Tag sutures were placed and the retractors were then placed   intracapsular.  We then identified the trochanteric fossa and   orientation of my neck cut and then made a neck osteotomy with the femur on traction.  The femoral   head was removed without difficulty or complication.  Traction was let   off and retractors were placed posterior and anterior around the   acetabulum.      The labrum and foveal tissue were debrided.  I began reaming with a 44 mm   reamer and reamed up to 49 mm reamer with good bony bed preparation and a 50 mm  cup was chosen.  The final 50 mm Pinnacle cup was then impacted under fluoroscopy to confirm the depth of penetration and orientation with respect to   Abduction and forward flexion.  A screw was placed into the ilium followed by the hole eliminator.   The final   32+4 neutral Altrex liner was impacted with good visualized rim fit.  The cup was positioned anatomically within the acetabular portion of the pelvis.      At this point, the femur was rolled to 100 degrees.  Further capsule was   released off the inferior aspect of the femoral neck.  I then   released the superior capsule proximally.  With the leg in a neutral position the hook was placed laterally   along the femur under the vastus lateralis origin and elevated manually and then held in position using the hook attachment on the bed.  The leg was then extended and adducted with the leg rolled to 100   degrees of external rotation.  Retractors were placed along the medial calcar and posteriorly over the greater trochanter.  Once the proximal femur was fully   exposed, I used a box osteotome to set orientation.  I then began   broaching with the starting chili pepper broach and passed this by hand and then broached up to 5.  With the 5 broach in place I chose a standard neck and did several trial reductions.  The offset was appropriate, leg lengths   appeared to be equal best matched with the +5 head ball trial confirmed radiographically.   Given these findings, I went ahead and dislocated the hip, repositioned all   retractors and positioned the right hip in the extended and abducted position.  The final 5 standard Actis stem was   chosen and it was impacted down to the level of neck cut.  Based on this   and the trial reductions, a final 36+5 delta ceramic ball was chosen and   impacted onto a clean and dry trunnion, and the hip was reduced.  The   hip had been irrigated throughout the case again at this point.  I did   reapproximate the superior capsular leaflet to the anterior leaflet   using #1 Vicryl.  The fascia of the   tensor fascia lata muscle was then reapproximated using #1 Vicryl and #0 Stratafix sutures.  The   remaining wound was closed with 2-0 Vicryl and running 4-0  Monocryl.   The hip was cleaned, dried, and dressed sterilely using Dermabond and   Aquacel dressing.  The patient was then brought   to recovery  room in stable condition tolerating the procedure well.    Griffith Citron, PA-C was present for the entirety of the case involved from   preoperative positioning, perioperative retractor management, general   facilitation of the case, as well as primary wound closure as assistant.            Pietro Cassis Alvan Dame, M.D.        06/14/2020 1:59 PM

## 2020-06-14 NOTE — Discharge Instructions (Signed)

## 2020-06-14 NOTE — Anesthesia Preprocedure Evaluation (Signed)
Anesthesia Evaluation  Patient identified by MRN, date of birth, ID band Patient awake    Reviewed: Allergy & Precautions, NPO status , Patient's Chart, lab work & pertinent test results, reviewed documented beta blocker date and time   Airway Mallampati: I  TM Distance: >3 FB Neck ROM: Full    Dental no notable dental hx.    Pulmonary    Pulmonary exam normal        Cardiovascular hypertension, Pt. on home beta blockers Normal cardiovascular exam     Neuro/Psych negative neurological ROS  negative psych ROS   GI/Hepatic negative GI ROS, Neg liver ROS,   Endo/Other  negative endocrine ROS  Renal/GU negative Renal ROS  negative genitourinary   Musculoskeletal negative musculoskeletal ROS (+) Arthritis , Osteoarthritis,    Abdominal Normal abdominal exam  (+)   Peds negative pediatric ROS (+)  Hematology negative hematology ROS (+)   Anesthesia Other Findings   Reproductive/Obstetrics negative OB ROS                             Anesthesia Physical  Anesthesia Plan  ASA: II  Anesthesia Plan: Spinal   Post-op Pain Management:    Induction:   PONV Risk Score and Plan: 3 and Ondansetron, Dexamethasone and Midazolam  Airway Management Planned: Natural Airway and Simple Face Mask  Additional Equipment: None  Intra-op Plan:   Post-operative Plan:   Informed Consent: I have reviewed the patients History and Physical, chart, labs and discussed the procedure including the risks, benefits and alternatives for the proposed anesthesia with the patient or authorized representative who has indicated his/her understanding and acceptance.       Plan Discussed with: CRNA  Anesthesia Plan Comments:         Anesthesia Quick Evaluation

## 2020-06-14 NOTE — Anesthesia Postprocedure Evaluation (Signed)
Anesthesia Post Note  Patient: Elizabeth Armstrong  Procedure(s) Performed: TOTAL HIP ARTHROPLASTY ANTERIOR APPROACH (Right Hip)     Patient location during evaluation: PACU Anesthesia Type: Spinal Level of consciousness: awake and sedated Pain management: pain level controlled Vital Signs Assessment: post-procedure vital signs reviewed and stable Respiratory status: spontaneous breathing Cardiovascular status: stable Postop Assessment: no headache, no backache, spinal receding and no apparent nausea or vomiting Anesthetic complications: no   No complications documented.  Last Vitals:  Vitals:   06/14/20 1700 06/14/20 1715  BP: 138/79 (!) 144/87  Pulse: 62 63  Resp: 15 17  Temp:    SpO2: 99% 100%    Last Pain:  Vitals:   06/14/20 1715  TempSrc:   PainSc: 5                  John F Shanesha Bednarz Jr

## 2020-06-14 NOTE — Anesthesia Procedure Notes (Signed)
Spinal  Patient location during procedure: OR Start time: 06/14/2020 2:51 PM Staffing Performed: resident/CRNA  Anesthesiologist: Lyn Hollingshead, MD Resident/CRNA: Gerald Leitz, CRNA Preanesthetic Checklist Completed: patient identified, IV checked, site marked, risks and benefits discussed, surgical consent, monitors and equipment checked, pre-op evaluation and timeout performed Spinal Block Patient position: sitting Prep: ChloraPrep Patient monitoring: heart rate, continuous pulse ox, blood pressure and cardiac monitor Approach: midline Location: L3-4 Injection technique: single-shot Needle Needle type: Introducer and Pencan  Needle gauge: 24 G Needle length: 9 cm Assessment Sensory level: T4 Additional Notes Negative paresthesia. Negative blood return. Positive free-flowing CSF. Expiration date of kit checked and confirmed. Patient tolerated procedure well, without complications.

## 2020-06-14 NOTE — Transfer of Care (Signed)
Immediate Anesthesia Transfer of Care Note  Patient: Elizabeth Armstrong  Procedure(s) Performed: Procedure(s) with comments: TOTAL HIP ARTHROPLASTY ANTERIOR APPROACH (Right) - 70 mins  Patient Location: PACU  Anesthesia Type:Spinal  Level of Consciousness: awake, alert  and oriented  Airway & Oxygen Therapy: Patient Spontanous Breathing  Post-op Assessment: Report given to RN and Post -op Vital signs reviewed and stable  Post vital signs: Reviewed and stable  Last Vitals:  Vitals:   06/14/20 1346  BP: (!) 148/89  Pulse: 67  Resp: 18  Temp: 36.9 C  SpO2: 16%    Complications: No apparent anesthesia complications

## 2020-06-14 NOTE — Interval H&P Note (Signed)
History and Physical Interval Note:  06/14/2020 1:48 PM  Elizabeth Armstrong  has presented today for surgery, with the diagnosis of Right hip osteoarthritis.  The various methods of treatment have been discussed with the patient and family. After consideration of risks, benefits and other options for treatment, the patient has consented to  Procedure(s) with comments: TOTAL HIP ARTHROPLASTY ANTERIOR APPROACH (Right) - 70 mins as a surgical intervention.  The patient's history has been reviewed, patient examined, no change in status, stable for surgery.  I have reviewed the patient's chart and labs.  Questions were answered to the patient's satisfaction.     Mauri Pole

## 2020-06-15 DIAGNOSIS — E663 Overweight: Secondary | ICD-10-CM | POA: Diagnosis present

## 2020-06-15 DIAGNOSIS — M1611 Unilateral primary osteoarthritis, right hip: Secondary | ICD-10-CM | POA: Diagnosis not present

## 2020-06-15 LAB — BASIC METABOLIC PANEL
Anion gap: 12 (ref 5–15)
BUN: 12 mg/dL (ref 8–23)
CO2: 24 mmol/L (ref 22–32)
Calcium: 8.9 mg/dL (ref 8.9–10.3)
Chloride: 97 mmol/L — ABNORMAL LOW (ref 98–111)
Creatinine, Ser: 0.64 mg/dL (ref 0.44–1.00)
GFR calc Af Amer: >60 mL/min (ref >60)
GFR calc non Af Amer: >60 mL/min (ref >60)
Glucose, Bld: 185 mg/dL — ABNORMAL HIGH (ref 70–99)
Potassium: 3.2 mmol/L — ABNORMAL LOW (ref 3.5–5.1)
Sodium: 133 mmol/L — ABNORMAL LOW (ref 135–145)

## 2020-06-15 LAB — CBC
HCT: 33.6 % — ABNORMAL LOW (ref 36.0–46.0)
Hemoglobin: 11.4 g/dL — ABNORMAL LOW (ref 12.0–15.0)
MCH: 33.8 pg (ref 26.0–34.0)
MCHC: 33.9 g/dL (ref 30.0–36.0)
MCV: 99.7 fL (ref 80.0–100.0)
Platelets: 252 10*3/uL (ref 150–400)
RBC: 3.37 MIL/uL — ABNORMAL LOW (ref 3.87–5.11)
RDW: 13.1 % (ref 11.5–15.5)
WBC: 10.2 10*3/uL (ref 4.0–10.5)
nRBC: 0 % (ref 0.0–0.2)

## 2020-06-15 MED ORDER — HYDROCODONE-ACETAMINOPHEN 5-325 MG PO TABS: 1.0000 | ORAL_TABLET | ORAL | 0 refills | Status: DC | PRN

## 2020-06-15 MED ORDER — POTASSIUM CHLORIDE CRYS ER 20 MEQ PO TBCR
40.0000 meq | EXTENDED_RELEASE_TABLET | Freq: Once | ORAL | Status: AC
  Administered 2020-06-15: 40 meq via ORAL
  Filled 2020-06-15: qty 2

## 2020-06-15 MED ORDER — DOCUSATE SODIUM 100 MG PO CAPS
100.0000 mg | ORAL_CAPSULE | Freq: Two times a day (BID) | ORAL | 0 refills | Status: DC
Start: 2020-06-15 — End: 2020-12-05

## 2020-06-15 MED ORDER — ASPIRIN 81 MG PO CHEW: 81.0000 mg | CHEWABLE_TABLET | Freq: Two times a day (BID) | ORAL | 0 refills | Status: AC

## 2020-06-15 MED ORDER — METHOCARBAMOL 500 MG PO TABS: 500.0000 mg | ORAL_TABLET | Freq: Four times a day (QID) | ORAL | 0 refills | Status: DC | PRN

## 2020-06-15 MED ORDER — FERROUS SULFATE 325 (65 FE) MG PO TABS: 325.0000 mg | ORAL_TABLET | Freq: Three times a day (TID) | ORAL | 0 refills | Status: DC

## 2020-06-15 MED ORDER — POLYETHYLENE GLYCOL 3350 17 G PO PACK: 17.0000 g | PACK | Freq: Two times a day (BID) | ORAL | 0 refills | Status: DC

## 2020-06-15 NOTE — Evaluation (Signed)
Physical Therapy Evaluation Patient Details Name: Elizabeth Armstrong MRN: [id number] DOB: 1956/05/17 Today's Date: 06/15/2020   History of Present Illness  s/p R THA, direct anterior approach on 7/13. H/O bilateral TKA  Clinical Impression  The patient reports soreness and burning of right thigh. Patient ambulated and has been instructed in HEP and has practiced stairs. Patient is to Dc home today. No further PT needs. RW deliverd to room.    Follow Up Recommendations Follow surgeon's recommendation for DC plan and follow-up therapies    Equipment Recommendations  Rolling walker with 5" wheels    Recommendations for Other Services       Precautions / Restrictions Precautions Precautions: Fall Restrictions Weight Bearing Restrictions: No      Mobility  Bed Mobility               General bed mobility comments: OOB  Transfers Overall transfer level: Needs assistance Equipment used: Rolling walker (2 wheeled) Transfers: Sit to/from Stand Sit to Stand: Supervision         General transfer comment: cues for hand placement  Ambulation/Gait Ambulation/Gait assistance: Supervision Gait Distance (Feet): 400 Feet Assistive device: Rolling walker (2 wheeled) Gait Pattern/deviations: Step-through pattern;Decreased step length - right Gait velocity: decr   General Gait Details: cues for sequence and to make effort to externally rotate during swing on right leg as it tends to IR  Stairs Stairs: Yes Stairs assistance: Min guard Stair Management: One rail Right;Step to pattern;Sideways Number of Stairs: 10 General stair comments: cues for safety on step edge, performs sideways safely  Wheelchair Mobility    Modified Rankin (Stroke Patients Only)       Balance Overall balance assessment: No apparent balance deficits (not formally assessed)                                           Pertinent Vitals/Pain Pain Assessment: 0-10 Pain Score: 4   Pain Location: right inner thigh Pain Descriptors / Indicators: Burning;Discomfort Pain Intervention(s): Premedicated before session;Ice applied;Monitored during session    Home Living Family/patient expects to be discharged to:: Private residence Living Arrangements: Spouse/significant other Available Help at Discharge: Family Type of Home: House Home Access: Stairs to enter   Technical brewer of Steps: 1 Home Layout: Two level;Bed/bath upstairs Home Equipment: Cane - single point      Prior Function Level of Independence: Independent               Hand Dominance        Extremity/Trunk Assessment   Upper Extremity Assessment Upper Extremity Assessment: Overall WFL for tasks assessed    Lower Extremity Assessment Lower Extremity Assessment: RLE deficits/detail RLE Deficits / Details: able to perform LAQ, needs assist to flex hip in supine    Cervical / Trunk Assessment Cervical / Trunk Assessment: Normal  Communication   Communication: No difficulties  Cognition Arousal/Alertness: Awake/alert Behavior During Therapy: WFL for tasks assessed/performed Overall Cognitive Status: Within Functional Limits for tasks assessed                                        General Comments General comments (skin integrity, edema, etc.): can stand static without UE, using RW without balance loss    Exercises Total Joint Exercises Ankle Circles/Pumps: AROM;Both Quad Sets:  AROM;Both;10 reps;Supine Short Arc Quad: AROM;Right;10 reps;Supine Heel Slides: AAROM;Supine;Right;10 reps Hip ABduction/ADduction: AAROM;Right;10 reps;Supine Long Arc Quad: AROM;Right;10 reps;Seated   Assessment/Plan    PT Assessment Patent does not need any further PT services  PT Problem List         PT Treatment Interventions      PT Goals (Current goals can be found in the Care Plan section)  Acute Rehab PT Goals Patient Stated Goal: go home, play golf PT Goal  Formulation: All assessment and education complete, DC therapy    Frequency     Barriers to discharge        Co-evaluation               AM-PAC PT "6 Clicks" Mobility  Outcome Measure Help needed turning from your back to your side while in a flat bed without using bedrails?: None Help needed moving from lying on your back to sitting on the side of a flat bed without using bedrails?: None Help needed moving to and from a bed to a chair (including a wheelchair)?: A Little Help needed standing up from a chair using your arms (e.g., wheelchair or bedside chair)?: A Little Help needed to walk in hospital room?: A Little Help needed climbing 3-5 steps with a railing? : A Little 6 Click Score: 20    End of Session   Activity Tolerance: Patient tolerated treatment well Patient left: in chair;with call bell/phone within reach Nurse Communication: Mobility status PT Visit Diagnosis: Unsteadiness on feet (R26.81);Muscle weakness (generalized) (M62.81)    Time: 3748-2707 PT Time Calculation (min) (ACUTE ONLY): 36 min   Charges:   PT Evaluation $PT Eval Low Complexity: 1 Low PT Treatments $Gait Training: 8-22 mins        Tresa Endo PT Acute Rehabilitation Services Pager [phone] Office [phone]   Claretha Cooper 06/15/2020, 10:17 AM

## 2020-06-15 NOTE — Progress Notes (Signed)
     Subjective: 1 Day Post-Op Procedure(s) (LRB): TOTAL HIP ARTHROPLASTY ANTERIOR APPROACH (Right)   Patient reports pain as mild, pain controlled. No reported events throughout the night.  Discussed the procedure and expectations moving forward.  Ready to be discharged home, if they do well with PT.  Follow up in the clinic in 2 weeks.  Knows to call with any questions or concerns.       Objective:   VITALS:   Vitals:   06/15/20 0539 06/15/20 0854  BP: 101/61 131/75  Pulse: 81 71  Resp: 16 16  Temp: 97.8 F (36.6 C) 97.8 F (36.6 C)  SpO2: 96% 100%    Dorsiflexion/Plantar flexion intact Incision: dressing C/D/I No cellulitis present Compartment soft  LABS Recent Labs    06/15/20 0324  HGB 11.4*  HCT 33.6*  WBC 10.2  PLT 252    Recent Labs    06/15/20 0324  NA 133*  K 3.2*  BUN 12  CREATININE 0.64  GLUCOSE 185*     Assessment/Plan: 1 Day Post-Op Procedure(s) (LRB): TOTAL HIP ARTHROPLASTY ANTERIOR APPROACH (Right) Foley cath d/c'ed Advance diet Up with therapy D/C IV fluids Discharge home Follow up in 2 weeks at [facility] Follow up with OLIN,Kayleana Waites D in 2 weeks.  Contact information:  [facility] [address], [address] [address] [phone]      Overweight (BMI 25-29.9) Estimated body mass index is 25.67 kg/m as calculated from the following:   Height as of this encounter: 5' 4.5" (1.638 m).   Weight as of this encounter: 68.9 kg. Patient also counseled that weight may inhibit the healing process Patient counseled that losing weight will help with future health issues      Danae Orleans PA-C  [facility]  [place] [address], [place], [place] [address] Phone: [phone] [email or web address] Facebook  [employer]

## 2020-06-15 NOTE — TOC Transition Note (Signed)
Transition of Care [facility]) - CM/SW Discharge Note   Patient Details  Name: Elizabeth Armstrong MRN: [id number] Date of Birth: June 08, 1956  Transition of Care [facility]) CM/SW Contact:  Lia Hopping, [place] Phone Number: 06/15/2020, 10:55 AM   Clinical Narrative:    Therapy Plan: HEP [employer] delivered RW and 3 in1 to pt. bedside.   Final next level of care: Home/Self Care (HEP) Barriers to Discharge: No Barriers Identified   Patient Goals and CMS Choice     Choice offered to / list presented to : NA  Discharge Placement                       Discharge Plan and Services                DME Arranged: 3-N-1, Walker rolling DME Agency: [employer] Date DME Agency Contacted: 06/15/20 Time DME Agency Contacted: 0900 Representative spoke with at DME Agency: Ovid Curd HH Arranged: NA [place] Agency: NA        Social Determinants of Health ([place]) Interventions     Readmission Risk Interventions No flowsheet data found.

## 2020-06-15 NOTE — Progress Notes (Signed)
Reviewed d/c instructions w/ patient, verbalized understanding of instructions. Awaiting ride to home.

## 2020-06-15 NOTE — Plan of Care (Signed)
Plan of care initiated and discussed with the patient. 

## 2020-06-15 NOTE — Discharge Summary (Signed)
Patient ID: Elizabeth Armstrong MRN: [id number] DOB/AGE: 1956/05/30 64 y.o.  Admit date: 06/14/2020 Discharge date: 06/15/2020  Admission Diagnoses:  Principal Problem:   Osteoarthritis of right hip Active Problems:   Status post right hip replacement   Overweight (BMI 25.0-29.9)   Discharge Diagnoses:  Same  Past Medical History:  Diagnosis Date  . Atypical mole 04/11/2007   SEVERE LEFT MID BACK- WIDER SHAVE  . Atypical mole 01/15/2014   MODERATE-LEFT THIGH UPPER- NO TREATMENT  . Atypical mole 08/03/2015   MODERATE-LEFT LOWER BACK- NO TREATMENT  . Atypical mole 08/03/2015   SEVERE-LEFT UPPER PARASPINAL- WIDER SHAVE  . Atypical mole 08/03/2015   MILD-RIGHT INNER KNEE-NO TREATMENT  . Atypical mole 11/15/2015   SEVERE-RIGHT POST THIGH-WIDER SHAVE  . Atypical mole 12/07/2016   MILD-LEFT LOW BACK- NO TREATMENT  . B12 deficiency 04/17/2019  . Basal cell carcinoma 01/15/2014   nodular chest-cx3,5FU  . BCC (basal cell carcinoma of skin) 03/07/2017   NODULAR CHEST-TX AFTER BIOPSY  . Essential hypertension 11/19/2014  . Heart murmur   . HTN (hypertension)   . Hypercholesteremia   . Melanoma in situ ([place]) 01/15/2014   LOWER LEFT BACK- MOHS DR. Manley Mason  . Melanoma of skin ([place]) 08/25/2015   sees dermatologist at least twice per year, Dr. Denna Haggard  . Osteoarthritis 11/19/2014  . Osteoarthrosis, unspecified whether generalized or localized, involving lower leg 11/19/2014   of knees, s/p knee replacements bilat, sees [facility]  . Osteopenia 01/04/2017  . Palpitations 08/25/2015  . Palpitations   . Pneumonia   . Pure hypercholesterolemia 11/19/2014  . SCCA (squamous cell carcinoma) of skin 11/13/2017   MID CHEST-C X 3, 5FU  . Vaginal atrophy 08/25/2015    Surgeries: Procedure(s): RIGHT TOTAL HIP ARTHROPLASTY ANTERIOR APPROACH on 06/14/2020   Consultants: N/A  Discharged Condition: Improved  Hospital Course: Elizabeth Armstrong is an 64 y.o. female who was admitted 06/14/2020  for operative treatment ofOsteoarthritis of right hip. Patient has severe unremitting pain that affects sleep, daily activities, and work/hobbies. After pre-op clearance the patient was taken to the operating room on 06/14/2020 and underwent  Procedure(s): RIGHT TOTAL HIP ARTHROPLASTY ANTERIOR APPROACH.    Patient was given perioperative antibiotics:  Anti-infectives (From admission, onward)   Start     Dose/Rate Route Frequency Ordered Stop   06/15/20 0600  ceFAZolin (ANCEF) IVPB 2g/100 mL premix        2 g 200 mL/hr over 30 Minutes Intravenous On call to O.R. 06/14/20 1334 06/14/20 1512   06/14/20 2100  ceFAZolin (ANCEF) IVPB 2g/100 mL premix        2 g 200 mL/hr over 30 Minutes Intravenous Every 6 hours 06/14/20 2045 06/15/20 0409       Patient was given sequential compression devices, early ambulation, and chemoprophylaxis to prevent DVT.  Patient benefited maximally from hospital stay and there were no complications.    Recent vital signs:  Patient Vitals for the past 24 hrs:  BP Temp Temp src Pulse Resp SpO2 Height Weight  06/15/20 0854 131/75 97.8 F (36.6 C) Oral 71 16 100 % -- --  06/15/20 0539 101/61 97.8 F (36.6 C) Oral 81 16 96 % -- --  06/15/20 0154 119/74 98.1 F (36.7 C) Oral 70 16 96 % -- --  06/14/20 2339 139/77 97.9 F (36.6 C) Oral 70 16 95 % -- --  06/14/20 2238 132/82 (!) 97.4 F (36.3 C) Oral 66 15 95 % -- --  06/14/20 2139 133/84 97.8 F (36.6  C) Oral 70 16 95 % -- --  06/14/20 2044 (!) 150/75 98.1 F (36.7 C) Oral 74 16 98 % -- --  06/14/20 2013 (!) 147/86 -- -- 72 17 96 % -- --  06/14/20 1930 -- -- -- 70 17 98 % -- --  06/14/20 1900 (!) 147/78 -- -- 72 19 96 % -- --  06/14/20 1830 (!) 155/78 97.7 F (36.5 C) -- 66 16 100 % -- --  06/14/20 1745 (!) 147/78 -- -- 67 14 100 % -- --  06/14/20 1730 (!) 151/85 -- -- 61 16 100 % -- --  06/14/20 1715 (!) 144/87 -- -- 63 17 100 % -- --  06/14/20 1700 138/79 -- -- 62 15 99 % -- --  06/14/20 1645 132/74 -- --  (!) 59 15 99 % -- --  06/14/20 1630 123/74 97.6 F (36.4 C) -- 64 13 96 % -- --  06/14/20 1356 -- -- -- -- -- -- 5' 4.5" (1.638 m) 68.9 kg  06/14/20 1346 (!) 148/89 98.5 F (36.9 C) Oral 67 18 98 % -- --     Recent laboratory studies:  Recent Labs    06/15/20 0324  WBC 10.2  HGB 11.4*  HCT 33.6*  PLT 252  NA 133*  K 3.2*  CL 97*  CO2 24  BUN 12  CREATININE 0.64  GLUCOSE 185*  CALCIUM 8.9     Discharge Medications:   Allergies as of 06/15/2020      Reactions   Adhesive [tape] Rash      Medication List    STOP taking these medications   diclofenac 75 MG EC tablet Commonly known as: VOLTAREN     TAKE these medications   aspirin 81 MG chewable tablet Commonly known as: Aspirin Childrens Chew 1 tablet (81 mg total) by mouth 2 (two) times daily. Take for 4 weeks, then resume regular dose. Start taking on: June 16, 2020   carvedilol 12.5 MG tablet Commonly known as: COREG Take 1 tablet (12.5 mg total) by mouth 2 (two) times daily.   docusate sodium 100 MG capsule Commonly known as: Colace Take 1 capsule (100 mg total) by mouth 2 (two) times daily.   ferrous sulfate 325 (65 FE) MG tablet Commonly known as: FerrouSul Take 1 tablet (325 mg total) by mouth 3 (three) times daily with meals for 14 days.   hydrochlorothiazide 25 MG tablet Commonly known as: HYDRODIURIL Take 1 tablet (25 mg total) by mouth daily.   HYDROcodone-acetaminophen 5-325 MG tablet Commonly known as: Norco Take 1-2 tablets by mouth every 4 (four) hours as needed for moderate pain or severe pain.   methocarbamol 500 MG tablet Commonly known as: Robaxin Take 1 tablet (500 mg total) by mouth every 6 (six) hours as needed for muscle spasms.   polyethylene glycol 17 g packet Commonly known as: MIRALAX / GLYCOLAX Take 17 g by mouth 2 (two) times daily.   potassium chloride 10 MEQ tablet Commonly known as: KLOR-CON Take 1 tablet (10 mEq total) by mouth daily.   triamcinolone cream 0.1  % Commonly known as: KENALOG Apply 1 application topically daily.   vitamin B-12 1000 MCG tablet Commonly known as: CYANOCOBALAMIN Take 1,000 mcg by mouth daily.   VITAMIN D3 PO Take 1,000 Units by mouth daily.            Discharge Care Instructions  (From admission, onward)         Start     Ordered  06/15/20 0000  Change dressing       Comments: Maintain surgical dressing until follow up in the clinic. If the edges start to pull up, may reinforce with tape. If the dressing is no longer working, may remove and cover with gauze and tape, but must keep the area dry and clean.  Call with any questions or concerns.   06/15/20 0902          Diagnostic Studies: DG Pelvis Portable  Result Date: 06/14/2020 CLINICAL DATA:  Right hip arthroplasty EXAM: PORTABLE PELVIS 1-2 VIEWS COMPARISON:  04/27/2020 FINDINGS: Two frontal views of the pelvis demonstrate right hip arthroplasty in the expected position without signs of acute complication. There are no acute displaced fractures. Postsurgical changes are seen in the soft tissues overlying the right hip. IMPRESSION: 1. Unremarkable right hip arthroplasty. Electronically Signed   By: Randa Ngo M.D.   On: 06/14/2020 16:48   DG C-Arm 1-60 Min-No Report  Result Date: 06/14/2020 CLINICAL DATA:  Right total hip replacement. EXAM: OPERATIVE RIGHT HIP (WITH PELVIS IF PERFORMED) TECHNIQUE: Fluoroscopic spot image(s) were submitted for interpretation post-operatively. COMPARISON:  None. FINDINGS: Four fluoroscopic spot views obtained in the operating room. Interval right hip arthroplasty. Total fluoroscopy time 12 seconds. Total dose 1.6 mGy. IMPRESSION: Procedural fluoroscopy during right hip arthroplasty. Electronically Signed   By: Keith Rake M.D.   On: 06/14/2020 18:54   DG HIP OPERATIVE UNILAT W OR W/O PELVIS RIGHT  Result Date: 06/14/2020 CLINICAL DATA:  Right total hip replacement. EXAM: OPERATIVE RIGHT HIP (WITH PELVIS IF  PERFORMED) TECHNIQUE: Fluoroscopic spot image(s) were submitted for interpretation post-operatively. COMPARISON:  None. FINDINGS: Four fluoroscopic spot views obtained in the operating room. Interval right hip arthroplasty. Total fluoroscopy time 12 seconds. Total dose 1.6 mGy. IMPRESSION: Procedural fluoroscopy during right hip arthroplasty. Electronically Signed   By: Keith Rake M.D.   On: 06/14/2020 18:54    Disposition: Home  Discharge Instructions    Call MD / Call 911   Complete by: As directed    If you experience chest pain or shortness of breath, CALL 911 and be transported to the hospital emergency room.  If you develope a fever above 101 F, pus (white drainage) or increased drainage or redness at the wound, or calf pain, call your surgeon's office.   Change dressing   Complete by: As directed    Maintain surgical dressing until follow up in the clinic. If the edges start to pull up, may reinforce with tape. If the dressing is no longer working, may remove and cover with gauze and tape, but must keep the area dry and clean.  Call with any questions or concerns.   Constipation Prevention   Complete by: As directed    Drink plenty of fluids.  Prune juice may be helpful.  You may use a stool softener, such as Colace (over the counter) 100 mg twice a day.  Use MiraLax (over the counter) for constipation as needed.   Diet - low sodium heart healthy   Complete by: As directed    Discharge instructions   Complete by: As directed    Maintain surgical dressing until follow up in the clinic. If the edges start to pull up, may reinforce with tape. If the dressing is no longer working, may remove and cover with gauze and tape, but must keep the area dry and clean.  Follow up in 2 weeks at [facility]. Call with any questions or concerns.   Increase activity slowly  as tolerated   Complete by: As directed    Weight bearing as tolerated with assist device (walker, cane, etc) as directed, use  it as long as suggested by your surgeon or therapist, typically at least 4-6 weeks.   TED hose   Complete by: As directed    Use stockings (TED hose) for 2 weeks on both leg(s).  You may remove them at night for sleeping.       Follow-up Information    Paralee Cancel, MD. Schedule an appointment as soon as possible for a visit in 2 weeks.   Specialty: Orthopedic Surgery Contact information: [address] [place] [address] [phone]                Signed: Lucille Passy [facility] 06/15/2020, 9:03 AM

## 2020-06-16 ENCOUNTER — Encounter (HOSPITAL_COMMUNITY): Payer: Self-pay | Admitting: Orthopedic Surgery

## 2020-06-20 NOTE — Addendum Note (Signed)
Addended by: Marrion Coy on: 06/20/2020 04:14 PM   Modules accepted: Orders

## 2020-07-15 ENCOUNTER — Other Ambulatory Visit: Payer: Medicare Other

## 2020-07-15 ENCOUNTER — Other Ambulatory Visit: Payer: Self-pay

## 2020-07-15 DIAGNOSIS — R7989 Other specified abnormal findings of blood chemistry: Secondary | ICD-10-CM

## 2020-07-15 DIAGNOSIS — E538 Deficiency of other specified B group vitamins: Secondary | ICD-10-CM

## 2020-07-15 DIAGNOSIS — E7800 Pure hypercholesterolemia, unspecified: Secondary | ICD-10-CM

## 2020-07-15 DIAGNOSIS — I10 Essential (primary) hypertension: Secondary | ICD-10-CM

## 2020-07-15 LAB — COMPREHENSIVE METABOLIC PANEL
AG Ratio: 2 (calc) (ref 1.0–2.5)
ALT: 9 U/L (ref 6–29)
AST: 12 U/L (ref 10–35)
Albumin: 4.3 g/dL (ref 3.6–5.1)
Alkaline phosphatase (APISO): 83 U/L (ref 37–153)
BUN: 14 mg/dL (ref 7–25)
CO2: 28 mmol/L (ref 20–32)
Calcium: 9.8 mg/dL (ref 8.6–10.4)
Chloride: 96 mmol/L — ABNORMAL LOW (ref 98–110)
Creat: 0.81 mg/dL (ref 0.50–0.99)
Globulin: 2.1 g/dL (calc) (ref 1.9–3.7)
Glucose, Bld: 108 mg/dL — ABNORMAL HIGH (ref 65–99)
Potassium: 4.5 mmol/L (ref 3.5–5.3)
Sodium: 133 mmol/L — ABNORMAL LOW (ref 135–146)
Total Bilirubin: 0.6 mg/dL (ref 0.2–1.2)
Total Protein: 6.4 g/dL (ref 6.1–8.1)

## 2020-07-15 LAB — CBC WITH DIFFERENTIAL/PLATELET
Absolute Monocytes: 586 cells/uL (ref 200–950)
Basophils Absolute: 48 cells/uL (ref 0–200)
Basophils Relative: 1 %
Eosinophils Absolute: 360 cells/uL (ref 15–500)
Eosinophils Relative: 7.5 %
HCT: 37.2 % (ref 35.0–45.0)
Hemoglobin: 12.5 g/dL (ref 11.7–15.5)
Lymphs Abs: 1613 cells/uL (ref 850–3900)
MCH: 33.8 pg — ABNORMAL HIGH (ref 27.0–33.0)
MCHC: 33.6 g/dL (ref 32.0–36.0)
MCV: 100.5 fL — ABNORMAL HIGH (ref 80.0–100.0)
MPV: 9.6 fL (ref 7.5–12.5)
Monocytes Relative: 12.2 %
Neutro Abs: 2194 cells/uL (ref 1500–7800)
Neutrophils Relative %: 45.7 %
Platelets: 260 10*3/uL (ref 140–400)
RBC: 3.7 10*6/uL — ABNORMAL LOW (ref 3.80–5.10)
RDW: 12.4 % (ref 11.0–15.0)
Total Lymphocyte: 33.6 %
WBC: 4.8 10*3/uL (ref 3.8–10.8)

## 2020-07-15 LAB — LIPID PANEL
Cholesterol: 219 mg/dL — ABNORMAL HIGH (ref <200)
HDL: 82 mg/dL (ref >=50)
LDL Cholesterol (Calc): 121 mg/dL (calc) — ABNORMAL HIGH
Non-HDL Cholesterol (Calc): 137 mg/dL (calc) — ABNORMAL HIGH (ref <130)
Total CHOL/HDL Ratio: 2.7 (calc) (ref <5.0)
Triglycerides: 64 mg/dL (ref <150)

## 2020-07-15 LAB — VITAMIN B12: Vitamin B-12: 383 pg/mL (ref 200–1100)

## 2020-08-05 ENCOUNTER — Other Ambulatory Visit: Payer: Self-pay | Admitting: Cardiovascular Disease

## 2020-08-15 ENCOUNTER — Encounter: Payer: Self-pay | Admitting: Family Medicine

## 2020-08-15 NOTE — Telephone Encounter (Signed)
Spoke with the pt and informed her the PCP is out of the office.  Virtual appt scheduled for tomorrow with Dr Maudie Mercury at 11:20am.

## 2020-08-16 ENCOUNTER — Telehealth (INDEPENDENT_AMBULATORY_CARE_PROVIDER_SITE_OTHER): Payer: Medicare Other | Admitting: Family Medicine

## 2020-08-16 DIAGNOSIS — R300 Dysuria: Secondary | ICD-10-CM

## 2020-08-16 MED ORDER — CEPHALEXIN 500 MG PO CAPS
500.0000 mg | ORAL_CAPSULE | Freq: Two times a day (BID) | ORAL | 0 refills | Status: DC
Start: 2020-08-16 — End: 2020-12-05

## 2020-08-16 NOTE — Progress Notes (Signed)
Virtual Visit via Video Note  I connected with Elizabeth Armstrong  on 08/16/20 at 11:20 AM EDT by a video enabled telemedicine application and verified that I am speaking with the correct person using two identifiers.  Location patient: home, [place] Location provider:work or home office Persons participating in the virtual visit: patient, provider  I discussed the limitations of evaluation and management by telemedicine and the availability of in person appointments. The patient expressed understanding and agreed to proceed.   HPI:  Acute visit for dysuria: -started about 6-7 days ago -symptoms include: dysuria, frequency, urgency, pressure -she started drinking a lot of water and started Azo -she has had UTIs, this feels the same -she had some keflex at home - 4 tablets and she took 2 days of this and improved; is requesting Rx of keflex - enough to finish course of this -denies fevers, NVD, flank pain, hematuria, vaginal symptoms   ROS: See pertinent positives and negatives per HPI.  Past Medical History:  Diagnosis Date  . Atypical mole 04/11/2007   SEVERE LEFT MID BACK- WIDER SHAVE  . Atypical mole 01/15/2014   MODERATE-LEFT THIGH UPPER- NO TREATMENT  . Atypical mole 08/03/2015   MODERATE-LEFT LOWER BACK- NO TREATMENT  . Atypical mole 08/03/2015   SEVERE-LEFT UPPER PARASPINAL- WIDER SHAVE  . Atypical mole 08/03/2015   MILD-RIGHT INNER KNEE-NO TREATMENT  . Atypical mole 11/15/2015   SEVERE-RIGHT POST THIGH-WIDER SHAVE  . Atypical mole 12/07/2016   MILD-LEFT LOW BACK- NO TREATMENT  . B12 deficiency 04/17/2019  . Basal cell carcinoma 01/15/2014   nodular chest-cx3,5FU  . BCC (basal cell carcinoma of skin) 03/07/2017   NODULAR CHEST-TX AFTER BIOPSY  . Essential hypertension 11/19/2014  . Heart murmur   . HTN (hypertension)   . Hypercholesteremia   . Melanoma in situ ([place]) 01/15/2014   LOWER LEFT BACK- MOHS DR. Manley Mason  . Melanoma of skin ([place]) 08/25/2015   sees dermatologist at  least twice per year, Dr. Denna Haggard  . Osteoarthritis 11/19/2014  . Osteoarthrosis, unspecified whether generalized or localized, involving lower leg 11/19/2014   of knees, s/p knee replacements bilat, sees [facility]  . Osteopenia 01/04/2017  . Palpitations 08/25/2015  . Palpitations   . Pneumonia   . Pure hypercholesterolemia 11/19/2014  . SCCA (squamous cell carcinoma) of skin 11/13/2017   MID CHEST-C X 3, 5FU  . Vaginal atrophy 08/25/2015    Past Surgical History:  Procedure Laterality Date  . APPENDECTOMY  2009  . CESAREAN SECTION    . JOINT REPLACEMENT Bilateral    knees  . KNEE SURGERY  2009   left  . OPEN REDUCTION INTERNAL FIXATION (ORIF) METACARPAL Left 06/19/2018   Procedure: LEFT SMALL FINGER OPEN REDUCTION INTERNAL FIXATION (ORIF) CARPOMETACARPAL FUSION;  Surgeon: Leanora Cover, MD;  Location: [place];  Service: Orthopedics;  Laterality: Left;  . TOTAL HIP ARTHROPLASTY Right 06/14/2020   Procedure: TOTAL HIP ARTHROPLASTY ANTERIOR APPROACH;  Surgeon: Paralee Cancel, MD;  Location: [facility];  Service: Orthopedics;  Laterality: Right;  70 mins  . WRIST SURGERY  2007   right    Family History  Problem Relation Age of Onset  . Heart disease Other     SOCIAL HX: see hpi   Current Outpatient Medications:  .  carvedilol (COREG) 12.5 MG tablet, Take 1 tablet (12.5 mg total) by mouth 2 (two) times daily., Disp: 180 tablet, Rfl: 1 .  cephALEXin (KEFLEX) 500 MG capsule, Take 1 capsule (500 mg total) by mouth 2 (two) times  daily., Disp: 10 capsule, Rfl: 0 .  Cholecalciferol (VITAMIN D3 PO), Take 1,000 Units by mouth daily., Disp: , Rfl:  .  docusate sodium (COLACE) 100 MG capsule, Take 1 capsule (100 mg total) by mouth 2 (two) times daily., Disp: 28 capsule, Rfl: 0 .  ferrous sulfate (FERROUSUL) 325 (65 FE) MG tablet, Take 1 tablet (325 mg total) by mouth 3 (three) times daily with meals for 14 days., Disp: 42 tablet, Rfl: 0 .  hydrochlorothiazide (HYDRODIURIL) 25 MG  tablet, Take 1 tablet (25 mg total) by mouth daily., Disp: 90 tablet, Rfl: 3 .  HYDROcodone-acetaminophen (NORCO) 5-325 MG tablet, Take 1-2 tablets by mouth every 4 (four) hours as needed for moderate pain or severe pain., Disp: 60 tablet, Rfl: 0 .  methocarbamol (ROBAXIN) 500 MG tablet, Take 1 tablet (500 mg total) by mouth every 6 (six) hours as needed for muscle spasms., Disp: 40 tablet, Rfl: 0 .  polyethylene glycol (MIRALAX / GLYCOLAX) 17 g packet, Take 17 g by mouth 2 (two) times daily., Disp: 28 packet, Rfl: 0 .  potassium chloride (KLOR-CON) 10 MEQ tablet, Take 1 tablet (10 mEq total) by mouth daily., Disp: 90 tablet, Rfl: 3 .  triamcinolone cream (KENALOG) 0.1 %, Apply 1 application topically daily., Disp: 453.6 g, Rfl: 3 .  vitamin B-12 (CYANOCOBALAMIN) 1000 MCG tablet, Take 1,000 mcg by mouth daily., Disp: , Rfl:   EXAM:  VITALS per patient if applicable:  GENERAL: alert, oriented, appears well and in no acute distress  HEENT: atraumatic, conjunttiva clear, no obvious abnormalities on inspection of external nose and ears  NECK: normal movements of the head and neck  LUNGS: on inspection no signs of respiratory distress, breathing rate appears normal, no obvious gross SOB, gasping or wheezing  CV: no obvious cyanosis  MS: moves all visible extremities without noticeable abnormality  PSYCH/NEURO: pleasant and cooperative, no obvious depression or anxiety, speech and thought processing grossly intact  ASSESSMENT AND PLAN:  Discussed the following assessment and plan:  Dysuria  -we discussed possible serious and likely etiologies, options for evaluation and workup, limitations of telemedicine visit vs in person visit, treatment, treatment risks and precautions. Pt prefers to treat via telemedicine empirically rather then risking or undertaking an in person visit at this moment. Query UTI vs other. She prefers to try finishing empiric course of Keflex - sent Rx for Keflex 500mg   bid x 5 days. Advised to seek prompt follow up telemedicine visit or in person care if worsening, new symptoms arise, or if is not improving with treatment.    I discussed the assessment and treatment plan with the patient. The patient was provided an opportunity to ask questions and all were answered. The patient agreed with the plan and demonstrated an understanding of the instructions.      Lucretia Kern, DO

## 2020-08-16 NOTE — Patient Instructions (Signed)
-  I sent the medication(s) we discussed to your pharmacy: Meds ordered this encounter  Medications  . cephALEXin (KEFLEX) 500 MG capsule    Sig: Take 1 capsule (500 mg total) by mouth 2 (two) times daily.    Dispense:  10 capsule    Refill:  0    Please let us know if you have any questions or concerns regarding this prescription.  I hope you are feeling better soon! Seek care promptly if your symptoms worsen, new concerns arise or you are not improving with treatment.

## 2020-08-30 ENCOUNTER — Encounter: Payer: Self-pay | Admitting: Family Medicine

## 2020-09-08 ENCOUNTER — Other Ambulatory Visit: Payer: Self-pay

## 2020-09-08 ENCOUNTER — Other Ambulatory Visit: Payer: Self-pay | Admitting: Cardiovascular Disease

## 2020-09-08 MED ORDER — CARVEDILOL 12.5 MG PO TABS: 12.5000 mg | ORAL_TABLET | Freq: Two times a day (BID) | ORAL | 3 refills | Status: DC

## 2020-09-09 ENCOUNTER — Telehealth: Payer: Self-pay | Admitting: Family Medicine

## 2020-09-09 NOTE — Telephone Encounter (Signed)
Pt is calling in wanting to get her pneumonia vaccine is she eligible for it and she also stated that she looked on her mychart and it is not showing that she had her flu shot 08/29/2020 ([facility] on [address]) and would like to see if it can be updated.

## 2020-09-09 NOTE — Telephone Encounter (Signed)
Flu vaccine info updated as below.

## 2020-09-09 NOTE — Telephone Encounter (Signed)
She can come for nurse visit at her convenience to get her PPSV23 and that will complete her pneumonia shots.

## 2020-09-12 NOTE — Telephone Encounter (Signed)
Spoke with the pt and informed her of the message below.  Attempted to schedule a nurses visit and the next opening was on 11/15.  [place] and will check to see if this can be given at the pharmacy.

## 2020-12-04 ENCOUNTER — Encounter: Payer: Self-pay | Admitting: Cardiovascular Disease

## 2020-12-04 NOTE — Progress Notes (Signed)
Cardiology Office Note:    Date:  12/06/2020   ID:  Elizabeth Armstrong, DOB [birth year], MRN [id number]  PCP:  Wynn Banker, MD  Cardiologist:  Makeya Hilgert   Electrophysiologist:  None   Referring MD: Wynn Banker, MD   Chief Complaint  Patient presents with  . Hypertension    History of Present Illness:    Elizabeth Armstrong is a 65 y.o. female with a hx of HTN is been having some palpitations for the past several weeks.  HR was elevated at baseline.   Felt flushed.  Occurred at work  BP was elevated several weeks ago  Noticed that her HR was "racing" for a few seconds.   Would last for few seconds .plays golf several tiems a week Light cardio and weights several times a week. Rides stationary bike   Has tried Bystolic ( from Dr. Deborah Chalk) .  Insurance stopped paying for it and she changed to amlodipine a year ago Has gained some weight .   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  Dec, 14,  2020   Elizabeth Armstrong is seen today for follow up of her HTN. BP has been a bit elevated Is exercising some  On Coreg 12. 5 BID,  Is off amlodipine  Takes Mobic 7.5 mg about every day  We will add HCTZ 25 mg a day and Kdur 10 meq a day    Jan. 4, 2022: Elizabeth Armstrong is seen for follow up of her HTN No cp, no dyspnea Has been visiting family for the past several months  Exercising well Playing lots of golf  Hip replacement in mid july  Past Medical History:  Diagnosis Date  . Atypical mole 04/11/2007   SEVERE LEFT MID BACK- WIDER SHAVE  . Atypical mole 01/15/2014   MODERATE-LEFT THIGH UPPER- NO TREATMENT  . Atypical mole 08/03/2015   MODERATE-LEFT LOWER BACK- NO TREATMENT  . Atypical mole 08/03/2015   SEVERE-LEFT UPPER PARASPINAL- WIDER SHAVE  . Atypical mole 08/03/2015   MILD-RIGHT INNER KNEE-NO TREATMENT  .  Atypical mole 11/15/2015   SEVERE-RIGHT POST THIGH-WIDER SHAVE  . Atypical mole 12/07/2016   MILD-LEFT LOW BACK- NO TREATMENT  . B12 deficiency 04/17/2019  . Basal cell carcinoma 01/15/2014   nodular chest-cx3,5FU  . BCC (basal cell carcinoma of skin) 03/07/2017   NODULAR CHEST-TX AFTER BIOPSY  . Essential hypertension 11/19/2014  . Heart murmur   . HTN (hypertension)   . Hypercholesteremia   . Melanoma in situ (HCC) 01/15/2014   LOWER LEFT BACK- MOHS DR. Lorn Junes  . Melanoma of skin (HCC) 08/25/2015   sees dermatologist at least twice per year, Dr. Jorja Loa  . Osteoarthritis 11/19/2014  . Osteoarthrosis, unspecified whether generalized or localized, involving lower leg 11/19/2014   of knees, s/p knee replacements bilat, sees [facility]  . Osteopenia 01/04/2017  . Palpitations 08/25/2015  . Palpitations   . Pneumonia   . Pure hypercholesterolemia 11/19/2014  . SCCA (squamous cell carcinoma) of skin 11/13/2017   MID CHEST-C X 3, 5FU  . Vaginal atrophy 08/25/2015    Past Surgical History:  Procedure Laterality Date  . APPENDECTOMY  2009  . CESAREAN SECTION    . JOINT REPLACEMENT Bilateral    knees  . KNEE SURGERY  2009   left  . OPEN REDUCTION INTERNAL FIXATION (  ORIF) METACARPAL Left 06/19/2018   Procedure: LEFT SMALL FINGER OPEN REDUCTION INTERNAL FIXATION (ORIF) CARPOMETACARPAL FUSION;  Surgeon: Leanora Cover, MD;  Location: [facility];  Service: Orthopedics;  Laterality: Left;  . TOTAL HIP ARTHROPLASTY Right 06/14/2020   Procedure: TOTAL HIP ARTHROPLASTY ANTERIOR APPROACH;  Surgeon: Paralee Cancel, MD;  Location: [facility];  Service: Orthopedics;  Laterality: Right;  70 mins  . WRIST SURGERY  2007   right    Current Medications: Current Meds  Medication Sig  . carvedilol (COREG) 12.5 MG tablet Take 1 tablet (12.5 mg total) by mouth 2 (two) times daily.  . hydrochlorothiazide (HYDRODIURIL) 25 MG tablet TAKE 1 TABLET BY MOUTH EVERY DAY  . potassium chloride (KLOR-CON)  10 MEQ tablet TAKE 1 TABLET BY MOUTH EVERY DAY  . [DISCONTINUED] Cholecalciferol (VITAMIN D3 PO) Take 1,000 Units by mouth daily.  . [DISCONTINUED] methocarbamol (ROBAXIN) 500 MG tablet Take 1 tablet (500 mg total) by mouth every 6 (six) hours as needed for muscle spasms.  . [DISCONTINUED] polyethylene glycol (MIRALAX / GLYCOLAX) 17 g packet Take 17 g by mouth 2 (two) times daily.  . [DISCONTINUED] triamcinolone cream (KENALOG) 0.1 % Apply 1 application topically daily.  . [DISCONTINUED] vitamin B-12 (CYANOCOBALAMIN) 1000 MCG tablet Take 1,000 mcg by mouth daily.     Allergies:   Adhesive [tape]   Social History   Socioeconomic History  . Marital status: Married    Spouse name: Not on file  . Number of children: 1  . Years of education: Not on file  . Highest education level: Not on file  Occupational History  . Occupation: Tree surgeon  Tobacco Use  . Smoking status: Never Smoker  . Smokeless tobacco: Never Used  Vaping Use  . Vaping Use: Never used  Substance and Sexual Activity  . Alcohol use: Yes    Alcohol/week: 3.0 standard drinks    Types: 3 Glasses of wine per week    Comment: social  . Drug use: Never  . Sexual activity: Not Currently  Other Topics Concern  . Not on file  Social History Narrative   Work or School: Nurse, adult Situation: lives with husband      Spiritual Beliefs: catholic      Lifestyle: very active with cycling and tennis; healthy diet      Social Determinants of Health   Financial Resource Strain: Not on file  Food Insecurity: Not on file  Transportation Needs: Not on file  Physical Activity: Not on file  Stress: Not on file  Social Connections: Not on file     Family History: The patient's family history includes Heart disease in an other family member.  ROS:   Please see the history of present illness.     All other systems reviewed and are negative.  EKGs/Labs/Other Studies Reviewed:    The following studies were  reviewed today:   EKG:   Jan. 4, 2022   Sinus brady at 53.  Poor R wave progression - possible ant septal MI   Recent Labs: 07/15/2020: ALT 9; BUN 14; Creat 0.81; Hemoglobin 12.5; Platelets 260; Potassium 4.5; Sodium 133  Recent Lipid Panel    Component Value Date/Time   CHOL 219 (H) 07/15/2020 0844   TRIG 64 07/15/2020 0844   HDL 82 07/15/2020 0844   CHOLHDL 2.7 07/15/2020 0844   VLDL 13.2 06/26/2019 0814   LDLCALC 121 (H) 07/15/2020 0844    Physical Exam:    Physical Exam:  Blood pressure 116/76, pulse (!) 55, height 5\' 4"  (1.626 m), weight 156 lb (70.8 kg), SpO2 98 %.  GEN:  Well nourished, well developed in no acute distress HEENT: Normal NECK: No JVD; No carotid bruits LYMPHATICS: No lymphadenopathy CARDIAC: RRR, no murmurs  RESPIRATORY:  Clear to auscultation without rales, wheezing or rhonchi  ABDOMEN: Soft, non-tender, non-distended MUSCULOSKELETAL:  No edema; No deformity  SKIN: Warm and dry NEUROLOGIC:  Alert and oriented x 3   ECG:  Jan. 4, 2022,   Sinus brady at 53.   Q waves V1-V3 - possible ant MI    ASSESSMENT:    1. Essential hypertension   2. Nonspecific abnormal electrocardiogram (ECG) (EKG)    PLAN:       Hypertension:  BP is well controlled.   Continue current medications.  2.  Abnormal EKG: She has poor R wave progression.  Her QRS duration is 126 ms.  While she has had some poor R wave progression on previous EKGs this appears to be more pronounced.  I would like to do an echocardiogram to make sure that she is not had a silent anterior wall myocardial infarction-although I think this is very unlikely.  I will see her again in 6 months.  We will repeat her EKG at that time.  She will give me a call if she has any chest pain or worsening shortness of breath.   Medication Adjustments/Labs and Tests Ordered: Current medicines are reviewed at length with the patient today.  Concerns regarding medicines are outlined above.  Orders Placed This  Encounter  Procedures  . EKG 12-Lead  . ECHOCARDIOGRAM COMPLETE   No orders of the defined types were placed in this encounter.    Patient Instructions  Medication Instructions:  Your provider recommends that you continue on your current medications as directed. Please refer to the Current Medication list given to you today.   *If you need a refill on your cardiac medications before your next appointment, please call your pharmacy*  Testing/Procedures: Your provider has requested that you have an echocardiogram. Echocardiography is a painless test that uses sound waves to create images of your heart. It provides your doctor with information about the size and shape of your heart and how well your heart's chambers and valves are working. This procedure takes approximately one hour. There are no restrictions for this procedure.  Follow-Up: At [facility], you and your health needs are our priority.  As part of our continuing mission to provide you with exceptional heart care, we have created designated Provider Care Teams.  These Care Teams include your primary Cardiologist (physician) and Advanced Practice Providers (APPs -  Physician Assistants and Nurse Practitioners) who all work together to provide you with the care you need, when you need it. Your next appointment:   6 month(s) The format for your next appointment:   In Person Provider:   You may see Mertie Moores, MD or one of the following Advanced Practice Providers on your designated Care Team:    Richardson Dopp, PA-C  Robbie Lis, [place]      Signed, Mertie Moores, MD  12/06/2020 9:12 AM    [place]

## 2020-12-06 ENCOUNTER — Encounter: Payer: Self-pay | Admitting: Cardiovascular Disease

## 2020-12-06 ENCOUNTER — Ambulatory Visit (INDEPENDENT_AMBULATORY_CARE_PROVIDER_SITE_OTHER): Payer: Medicare Other | Admitting: Cardiovascular Disease

## 2020-12-06 ENCOUNTER — Other Ambulatory Visit: Payer: Self-pay

## 2020-12-06 VITALS — BP 116/76 | HR 55 | Ht 64.0 in | Wt 156.0 lb

## 2020-12-06 DIAGNOSIS — I10 Essential (primary) hypertension: Secondary | ICD-10-CM | POA: Diagnosis not present

## 2020-12-06 DIAGNOSIS — R9431 Abnormal electrocardiogram [ECG] [EKG]: Secondary | ICD-10-CM | POA: Insufficient documentation

## 2020-12-06 NOTE — Patient Instructions (Signed)
Medication Instructions:  Your provider recommends that you continue on your current medications as directed. Please refer to the Current Medication list given to you today.   *If you need a refill on your cardiac medications before your next appointment, please call your pharmacy*  Testing/Procedures: Your provider has requested that you have an echocardiogram. Echocardiography is a painless test that uses sound waves to create images of your heart. It provides your doctor with information about the size and shape of your heart and how well your heart's chambers and valves are working. This procedure takes approximately one hour. There are no restrictions for this procedure.  Follow-Up: At [facility], you and your health needs are our priority.  As part of our continuing mission to provide you with exceptional heart care, we have created designated Provider Care Teams.  These Care Teams include your primary Cardiologist (physician) and Advanced Practice Providers (APPs -  Physician Assistants and Nurse Practitioners) who all work together to provide you with the care you need, when you need it. Your next appointment:   6 month(s) The format for your next appointment:   In Person Provider:   You may see Kristeen Miss, MD or one of the following Advanced Practice Providers on your designated Care Team:    Tereso Newcomer, PA-C  [place], [place]

## 2020-12-08 ENCOUNTER — Ambulatory Visit (HOSPITAL_COMMUNITY): Payer: Medicare Other | Attending: Internal Medicine

## 2020-12-08 ENCOUNTER — Other Ambulatory Visit: Payer: Self-pay

## 2020-12-08 DIAGNOSIS — R9431 Abnormal electrocardiogram [ECG] [EKG]: Secondary | ICD-10-CM | POA: Insufficient documentation

## 2020-12-08 DIAGNOSIS — I10 Essential (primary) hypertension: Secondary | ICD-10-CM | POA: Insufficient documentation

## 2020-12-08 LAB — ECHOCARDIOGRAM COMPLETE
Area-P 1/2: 2.69 cm2
S' Lateral: 3.1 cm

## 2020-12-26 ENCOUNTER — Ambulatory Visit (INDEPENDENT_AMBULATORY_CARE_PROVIDER_SITE_OTHER): Payer: Medicare Other

## 2020-12-26 DIAGNOSIS — Z0000 Encounter for general adult medical examination without abnormal findings: Secondary | ICD-10-CM | POA: Diagnosis not present

## 2020-12-26 NOTE — Patient Instructions (Signed)
Elizabeth Armstrong , Thank you for taking time to come for your Medicare Wellness Visit. I appreciate your ongoing commitment to your health goals. Please review the following plan we discussed and let me know if I can assist you in the future.   Screening recommendations/referrals: Colonoscopy: Up to date. Next due 05/03/2021 Mammogram: Up to date, next due 05/09/2021 Bone Density: Up to date, next due 05/09/2022 Recommended yearly ophthalmology/optometry visit for glaucoma screening and checkup Recommended yearly dental visit for hygiene and checkup  Vaccinations: Influenza vaccine: Up to date, next due fall 2022 Pneumococcal vaccine: Completed series  Tdap vaccine: Up to date, next due 04/12/2028 Shingles vaccine: Completed series    Advanced directives: Please bring copies of your advanced medical directives to our office so that I may scan it into your chart.   Conditions/risks identified: None   Next appointment: None    Preventive Care 65 Years and Older, Female Preventive care refers to lifestyle choices and visits with your health care provider that can promote health and wellness. What does preventive care include?  A yearly physical exam. This is also called an annual well check.  Dental exams once or twice a year.  Routine eye exams. Ask your health care provider how often you should have your eyes checked.  Personal lifestyle choices, including:  Daily care of your teeth and gums.  Regular physical activity.  Eating a healthy diet.  Avoiding tobacco and drug use.  Limiting alcohol use.  Practicing safe sex.  Taking low-dose aspirin every day.  Taking vitamin and mineral supplements as recommended by your health care provider. What happens during an annual well check? The services and screenings done by your health care provider during your annual well check will depend on your age, overall health, lifestyle risk factors, and family history of  disease. Counseling  Your health care provider may ask you questions about your:  Alcohol use.  Tobacco use.  Drug use.  Emotional well-being.  Home and relationship well-being.  Sexual activity.  Eating habits.  History of falls.  Memory and ability to understand (cognition).  Work and work Statistician.  Reproductive health. Screening  You may have the following tests or measurements:  Height, weight, and BMI.  Blood pressure.  Lipid and cholesterol levels. These may be checked every 5 years, or more frequently if you are over 93 years old.  Skin check.  Lung cancer screening. You may have this screening every year starting at age 23 if you have a 30-pack-year history of smoking and currently smoke or have quit within the past 15 years.  Fecal occult blood test (FOBT) of the stool. You may have this test every year starting at age 21.  Flexible sigmoidoscopy or colonoscopy. You may have a sigmoidoscopy every 5 years or a colonoscopy every 10 years starting at age 54.  Hepatitis C blood test.  Hepatitis B blood test.  Sexually transmitted disease (STD) testing.  Diabetes screening. This is done by checking your blood sugar (glucose) after you have not eaten for a while (fasting). You may have this done every 1-3 years.  Bone density scan. This is done to screen for osteoporosis. You may have this done starting at age 16.  Mammogram. This may be done every 1-2 years. Talk to your health care provider about how often you should have regular mammograms. Talk with your health care provider about your test results, treatment options, and if necessary, the need for more tests. Vaccines  Your health  care provider may recommend certain vaccines, such as:  Influenza vaccine. This is recommended every year.  Tetanus, diphtheria, and acellular pertussis (Tdap, Td) vaccine. You may need a Td booster every 10 years.  Zoster vaccine. You may need this after age  81.  Pneumococcal 13-valent conjugate (PCV13) vaccine. One dose is recommended after age 1.  Pneumococcal polysaccharide (PPSV23) vaccine. One dose is recommended after age 45. Talk to your health care provider about which screenings and vaccines you need and how often you need them. This information is not intended to replace advice given to you by your health care provider. Make sure you discuss any questions you have with your health care provider. Document Released: 12/16/2015 Document Revised: 08/08/2016 Document Reviewed: 09/20/2015 Elsevier Interactive Patient Education  2017 [place] Prevention in the Home Falls can cause injuries. They can happen to people of all ages. There are many things you can do to make your home safe and to help prevent falls. What can I do on the outside of my home?  Regularly fix the edges of walkways and driveways and fix any cracks.  Remove anything that might make you trip as you walk through a door, such as a raised step or threshold.  Trim any bushes or trees on the path to your home.  Use bright outdoor lighting.  Clear any walking paths of anything that might make someone trip, such as rocks or tools.  Regularly check to see if handrails are loose or broken. Make sure that both sides of any steps have handrails.  Any raised decks and porches should have guardrails on the edges.  Have any leaves, snow, or ice cleared regularly.  Use sand or salt on walking paths during winter.  Clean up any spills in your garage right away. This includes oil or grease spills. What can I do in the bathroom?  Use night lights.  Install grab bars by the toilet and in the tub and shower. Do not use towel bars as grab bars.  Use non-skid mats or decals in the tub or shower.  If you need to sit down in the shower, use a plastic, non-slip stool.  Keep the floor dry. Clean up any water that spills on the floor as soon as it happens.  Remove  soap buildup in the tub or shower regularly.  Attach bath mats securely with double-sided non-slip rug tape.  Do not have throw rugs and other things on the floor that can make you trip. What can I do in the bedroom?  Use night lights.  Make sure that you have a light by your bed that is easy to reach.  Do not use any sheets or blankets that are too big for your bed. They should not hang down onto the floor.  Have a firm chair that has side arms. You can use this for support while you get dressed.  Do not have throw rugs and other things on the floor that can make you trip. What can I do in the kitchen?  Clean up any spills right away.  Avoid walking on wet floors.  Keep items that you use a lot in easy-to-reach places.  If you need to reach something above you, use a strong step stool that has a grab bar.  Keep electrical cords out of the way.  Do not use floor polish or wax that makes floors slippery. If you must use wax, use non-skid floor wax.  Do not have  throw rugs and other things on the floor that can make you trip. What can I do with my stairs?  Do not leave any items on the stairs.  Make sure that there are handrails on both sides of the stairs and use them. Fix handrails that are broken or loose. Make sure that handrails are as long as the stairways.  Check any carpeting to make sure that it is firmly attached to the stairs. Fix any carpet that is loose or worn.  Avoid having throw rugs at the top or bottom of the stairs. If you do have throw rugs, attach them to the floor with carpet tape.  Make sure that you have a light switch at the top of the stairs and the bottom of the stairs. If you do not have them, ask someone to add them for you. What else can I do to help prevent falls?  Wear shoes that:  Do not have high heels.  Have rubber bottoms.  Are comfortable and fit you well.  Are closed at the toe. Do not wear sandals.  If you use a  stepladder:  Make sure that it is fully opened. Do not climb a closed stepladder.  Make sure that both sides of the stepladder are locked into place.  Ask someone to hold it for you, if possible.  Clearly mark and make sure that you can see:  Any grab bars or handrails.  First and last steps.  Where the edge of each step is.  Use tools that help you move around (mobility aids) if they are needed. These include:  Canes.  Walkers.  Scooters.  Crutches.  Turn on the lights when you go into a dark area. Replace any light bulbs as soon as they burn out.  Set up your furniture so you have a clear path. Avoid moving your furniture around.  If any of your floors are uneven, fix them.  If there are any pets around you, be aware of where they are.  Review your medicines with your doctor. Some medicines can make you feel dizzy. This can increase your chance of falling. Ask your doctor what other things that you can do to help prevent falls. This information is not intended to replace advice given to you by your health care provider. Make sure you discuss any questions you have with your health care provider. Document Released: 09/15/2009 Document Revised: 04/26/2016 Document Reviewed: 12/24/2014 Elsevier Interactive Patient Education  2017 [employer].

## 2020-12-26 NOTE — Progress Notes (Addendum)
Subjective:   MELISHA PRISK is a 65 y.o. female who presents for an Initial Medicare Annual Wellness Visit.  Virtual Visit via Video Note  I connected with Elham Sebastiani Twyman on 12/26/20 at  1:15 PM EST by a video enabled telemedicine application and verified that I am speaking with the correct person using two identifiers.  Location: Patient: Home  Provider: Office  Persons on the visit: Patient and provider    I discussed the limitations of evaluation and management by telemedicine and the availability of in person appointments. The patient expressed understanding and agreed to proceed.    Theodora Blow, LPN    Review of Systems    N/A Cardiac Risk Factors include: advanced age (>59men, >16 women)     Objective:    Today's Vitals   There is no height or weight on file to calculate BMI.  Advanced Directives 12/26/2020 06/14/2020 06/03/2020 06/19/2018 06/16/2018  Does Patient Have a Medical Advance Directive? Yes Yes Yes No No  Type of Estate agent of [place];Living will Living will;Healthcare Power of [employer] Power of [place];Living will - -  Does patient want to make changes to medical advance directive? No - Patient declined No - Patient declined - - -  Copy of Healthcare Power of Attorney in Chart? No - copy requested No - copy requested - - -  Would patient like information on creating a medical advance directive? - - - Yes (MAU/Ambulatory/Procedural Areas - Information given) Yes (MAU/Ambulatory/Procedural Areas - Information given)    Current Medications (verified) Outpatient Encounter Medications as of 12/26/2020  Medication Sig  . carvedilol (COREG) 12.5 MG tablet Take 1 tablet (12.5 mg total) by mouth 2 (two) times daily.  . hydrochlorothiazide (HYDRODIURIL) 25 MG tablet TAKE 1 TABLET BY MOUTH EVERY DAY  . potassium chloride (KLOR-CON) 10 MEQ tablet TAKE 1 TABLET BY MOUTH EVERY DAY   No facility-administered encounter  medications on file as of 12/26/2020.    Allergies (verified) Adhesive [tape]   History: Past Medical History:  Diagnosis Date  . Atypical mole 04/11/2007   SEVERE LEFT MID BACK- WIDER SHAVE  . Atypical mole 01/15/2014   MODERATE-LEFT THIGH UPPER- NO TREATMENT  . Atypical mole 08/03/2015   MODERATE-LEFT LOWER BACK- NO TREATMENT  . Atypical mole 08/03/2015   SEVERE-LEFT UPPER PARASPINAL- WIDER SHAVE  . Atypical mole 08/03/2015   MILD-RIGHT INNER KNEE-NO TREATMENT  . Atypical mole 11/15/2015   SEVERE-RIGHT POST THIGH-WIDER SHAVE  . Atypical mole 12/07/2016   MILD-LEFT LOW BACK- NO TREATMENT  . B12 deficiency 04/17/2019  . Basal cell carcinoma 01/15/2014   nodular chest-cx3,5FU  . BCC (basal cell carcinoma of skin) 03/07/2017   NODULAR CHEST-TX AFTER BIOPSY  . Essential hypertension 11/19/2014  . Heart murmur   . HTN (hypertension)   . Hypercholesteremia   . Melanoma in situ (HCC) 01/15/2014   LOWER LEFT BACK- MOHS DR. Lorn Junes  . Melanoma of skin (HCC) 08/25/2015   sees dermatologist at least twice per year, Dr. Jorja Loa  . Osteoarthritis 11/19/2014  . Osteoarthrosis, unspecified whether generalized or localized, involving lower leg 11/19/2014   of knees, s/p knee replacements bilat, sees [facility]  . Osteopenia 01/04/2017  . Palpitations 08/25/2015  . Palpitations   . Pneumonia   . Pure hypercholesterolemia 11/19/2014  . SCCA (squamous cell carcinoma) of skin 11/13/2017   MID CHEST-C X 3, 5FU  . Vaginal atrophy 08/25/2015   Past Surgical History:  Procedure Laterality Date  . APPENDECTOMY  2009  . CESAREAN SECTION    . JOINT REPLACEMENT Bilateral    knees  . KNEE SURGERY  2009   left  . OPEN REDUCTION INTERNAL FIXATION (ORIF) METACARPAL Left 06/19/2018   Procedure: LEFT SMALL FINGER OPEN REDUCTION INTERNAL FIXATION (ORIF) CARPOMETACARPAL FUSION;  Surgeon: Leanora Cover, MD;  Location: [facility];  Service: Orthopedics;  Laterality: Left;  . TOTAL HIP  ARTHROPLASTY Right 06/14/2020   Procedure: TOTAL HIP ARTHROPLASTY ANTERIOR APPROACH;  Surgeon: Paralee Cancel, MD;  Location: [facility];  Service: Orthopedics;  Laterality: Right;  70 mins  . WRIST SURGERY  2007   right   Family History  Problem Relation Age of Onset  . Heart disease Other    Social History   Socioeconomic History  . Marital status: Married    Spouse name: Not on file  . Number of children: 1  . Years of education: Not on file  . Highest education level: Not on file  Occupational History  . Occupation: Tree surgeon  Tobacco Use  . Smoking status: Never Smoker  . Smokeless tobacco: Never Used  Vaping Use  . Vaping Use: Never used  Substance and Sexual Activity  . Alcohol use: Yes    Alcohol/week: 3.0 standard drinks    Types: 3 Glasses of wine per week    Comment: social  . Drug use: Never  . Sexual activity: Not Currently  Other Topics Concern  . Not on file  Social History Narrative   Work or School: Nurse, adult Situation: lives with husband      Spiritual Beliefs: catholic      Lifestyle: very active with cycling and tennis; healthy diet      Social Determinants of Health   Financial Resource Strain: Low Risk   . Difficulty of Paying Living Expenses: Not hard at all  Food Insecurity: No Food Insecurity  . Worried About Charity fundraiser in the Last Year: Never true  . Ran Out of Food in the Last Year: Never true  Transportation Needs: No Transportation Needs  . Lack of Transportation (Medical): No  . Lack of Transportation (Non-Medical): No  Physical Activity: Sufficiently Active  . Days of Exercise per Week: 5 days  . Minutes of Exercise per Session: 90 min  Stress: No Stress Concern Present  . Feeling of Stress : Not at all  Social Connections: Moderately Integrated  . Frequency of Communication with Friends and Family: More than three times a week  . Frequency of Social Gatherings with Friends and Family: Once a week  . Attends  Religious Services: 1 to 4 times per year  . Active Member of Clubs or Organizations: No  . Attends Archivist Meetings: Never  . Marital Status: Married    Tobacco Counseling Counseling given: Not Answered   Clinical Intake:  Pre-visit preparation completed: Yes  Pain : No/denies pain     Nutritional Risks: None Diabetes: No  How often do you need to have someone help you when you read instructions, pamphlets, or other written materials from your doctor or pharmacy?: 1 - Never What is the last grade level you completed in school?: High School  Diabetic?No  Interpreter Needed?: No  Information entered by :: [place] of Daily Living In your present state of health, do you have any difficulty performing the following activities: 12/26/2020 06/14/2020  Hearing? N N  Vision? N N  Difficulty concentrating or making decisions? -  N  Walking or climbing stairs? N N  Dressing or bathing? N N  Doing errands, shopping? N N  Preparing Food and eating ? N -  Using the Toilet? N -  In the past six months, have you accidently leaked urine? N -  Do you have problems with loss of bowel control? N -  Managing your Medications? N -  Managing your Finances? N -  Housekeeping or managing your Housekeeping? N -  Some recent data might be hidden    Patient Care Team: Caren Macadam, MD as PCP - General (Family Medicine) Nahser, Wonda Cheng, MD as PCP - Cardiology (Cardiology) Warren Danes, PA-C as Physician Assistant (Dermatology)  Indicate any recent Medical Services you may have received from other than [facility] providers in the past year (date may be approximate).     Assessment:   This is a routine wellness examination for [place].  Hearing/Vision screen  Hearing Screening   125Hz  250Hz  500Hz  1000Hz  2000Hz  3000Hz  4000Hz  6000Hz  8000Hz   Right ear:           Left ear:           Vision Screening Comments: Patient states gets eyes examined every 2  years. Has glasses   Dietary issues and exercise activities discussed: Current Exercise Habits: Home exercise routine, Type of exercise: walking;stretching;strength training/weights, Time (Minutes): 60, Frequency (Times/Week): 5, Weekly Exercise (Minutes/Week): 300, Intensity: Mild  Goals    . Patient Stated     I will continue to go to the gym 5 days a week 1.5 hrs       Depression Screen PHQ 2/9 Scores 12/26/2020 07/03/2019 03/11/2018 03/21/2017  PHQ - 2 Score 0 0 0 0    Fall Risk Fall Risk  12/26/2020  Falls in the past year? 0  Number falls in past yr: 0  Injury with Fall? 0  Risk for fall due to : No Fall Risks  Follow up Falls evaluation completed;Falls prevention discussed    FALL RISK PREVENTION PERTAINING TO THE HOME:  Any stairs in or around the home? Yes  If so, are there any without handrails? No  Home free of loose throw rugs in walkways, pet beds, electrical cords, etc? Yes  Adequate lighting in your home to reduce risk of falls? Yes   ASSISTIVE DEVICES UTILIZED TO PREVENT FALLS:  Life alert? No  Use of a cane, walker or w/c? No  Grab bars in the bathroom? No  Shower chair or bench in shower? No  Elevated toilet seat or a handicapped toilet? No    Cognitive Function:        Immunizations Immunization History  Administered Date(s) Administered  . Influenza Split 09/07/2013  . Influenza, Seasonal, Injecte, Preservative Fre 09/15/2014  . Influenza,inj,Quad PF,6+ Mos 09/05/2016, 09/16/2017, 09/26/2018  . Influenza-Unspecified 07/12/2019, 08/29/2020  . PFIZER(Purple Top)SARS-COV-2 Vaccination 01/09/2020, 02/03/2020, 08/29/2020  . Pneumococcal Conjugate-13 07/03/2019  . Pneumococcal Polysaccharide-23 09/16/2020  . Tdap 10/25/2012, 04/12/2018  . Zoster 02/27/2016  . Zoster Recombinat (Shingrix) 11/22/2019, 02/25/2020    TDAP status: Up to date  Flu Vaccine status: Up to date  Pneumococcal vaccine status: Up to date  Covid-19 vaccine status: Completed  vaccines  Qualifies for Shingles Vaccine? Yes   Zostavax completed Yes   Shingrix Completed?: Yes  Screening Tests Health Maintenance  Topic Date Due  . COVID-19 Vaccine (4 - Booster for Pfizer series) 02/26/2021  . COLONOSCOPY (Pts 45-62yrs Insurance coverage will need to be confirmed)  05/03/2021  .  MAMMOGRAM  05/09/2022  . TETANUS/TDAP  04/12/2028  . INFLUENZA VACCINE  Completed  . DEXA SCAN  Completed  . Hepatitis C Screening  Completed  . PNA vac Low Risk Adult  Completed    Health Maintenance  There are no preventive care reminders to display for this patient.  Colorectal cancer screening: Type of screening: Colonoscopy. Completed 05/03/2018. Repeat every 3 years  Mammogram status: Completed 05/09/2020. Repeat every year  Bone Density status: Completed 05/09/2020. Results reflect: Bone density results: OSTEOPOROSIS. Repeat every 2 years.  Lung Cancer Screening: (Low Dose CT Chest recommended if Age 42-80 years, 30 pack-year currently smoking OR have quit w/in 15years.) does not qualify.   Lung Cancer Screening Referral: N/A   Additional Screening:  Hepatitis C Screening: does qualify;   Vision Screening: Recommended annual ophthalmology exams for early detection of glaucoma and other disorders of the eye. Is the patient up to date with their annual eye exam?  Yes  Who is the provider or what is the name of the office in which the patient attends annual eye exams? [facility] If pt is not established with a provider, would they like to be referred to a provider to establish care? No .   Dental Screening: Recommended annual dental exams for proper oral hygiene  Community Resource Referral / Chronic Care Management: CRR required this visit?  No   CCM required this visit?  No      Plan:     I have personally reviewed and noted the following in the patient's chart:   . Medical and social history . Use of alcohol, tobacco or illicit drugs  . Current  medications and supplements . Functional ability and status . Nutritional status . Physical activity . Advanced directives . List of other physicians . Hospitalizations, surgeries, and ER visits in previous 12 months . Vitals . Screenings to include cognitive, depression, and falls . Referrals and appointments  In addition, I have reviewed and discussed with patient certain preventive protocols, quality metrics, and best practice recommendations. A written personalized care plan for preventive services as well as general preventive health recommendations were provided to patient.     Ofilia Neas, LPN   [id number]   Nurse Notes: None

## 2021-02-22 ENCOUNTER — Other Ambulatory Visit: Payer: Self-pay

## 2021-02-22 ENCOUNTER — Ambulatory Visit (INDEPENDENT_AMBULATORY_CARE_PROVIDER_SITE_OTHER): Payer: Medicare Other | Admitting: Physician Assistant

## 2021-02-22 ENCOUNTER — Encounter: Payer: Self-pay | Admitting: Physician Assistant

## 2021-02-22 DIAGNOSIS — L570 Actinic keratosis: Secondary | ICD-10-CM

## 2021-02-22 DIAGNOSIS — D485 Neoplasm of uncertain behavior of skin: Secondary | ICD-10-CM | POA: Diagnosis not present

## 2021-02-22 DIAGNOSIS — Z86006 Personal history of melanoma in-situ: Secondary | ICD-10-CM | POA: Diagnosis not present

## 2021-02-22 DIAGNOSIS — Z85828 Personal history of other malignant neoplasm of skin: Secondary | ICD-10-CM

## 2021-02-22 DIAGNOSIS — Z1283 Encounter for screening for malignant neoplasm of skin: Secondary | ICD-10-CM

## 2021-02-22 NOTE — Progress Notes (Signed)
   Follow-Up Visit   Subjective  Elizabeth Armstrong is a 66 y.o. female who presents for the following: Annual Exam (No new concerns).   The following portions of the chart were reviewed this encounter and updated as appropriate:  Tobacco  Allergies  Meds  Problems  Med Hx  Surg Hx  Fam Hx      Objective  Well appearing patient in no apparent distress; mood and affect are within normal limits.  A full examination was performed including scalp, head, eyes, ears, nose, lips, neck, chest, axillae, abdomen, back, buttocks, bilateral upper extremities, bilateral lower extremities, hands, feet, fingers, toes, fingernails, and toenails. All findings within normal limits unless otherwise noted below.  Objective  Right Upper Back: Dark macule       Objective  Head - to toe: No atypical nevi No signs of non-mole skin cancer.   Objective  Left Lower Back: Scar clear. No LAD  Objective  Chest - Medial [facility]): Scar clear  Objective  Chest - Medial (Center) (4), Left Forearm - Posterior (2), Left Forehead, Right Forearm - Posterior (6): Erythematous patches with gritty scale.   Assessment & Plan  Neoplasm of uncertain behavior of skin Right Upper Back  Skin / nail biopsy Type of biopsy: tangential   Informed consent: discussed and consent obtained   Timeout: patient name, date of birth, surgical site, and procedure verified   Procedure prep:  Patient was prepped and draped in usual sterile fashion (Non sterile) Prep type:  Chlorhexidine Anesthesia: the lesion was anesthetized in a standard fashion   Anesthetic:  1% lidocaine w/ epinephrine 1-100,000 local infiltration Instrument used: flexible razor blade   Hemostasis achieved with: aluminum chloride and electrodesiccation   Outcome: patient tolerated procedure well   Post-procedure details: sterile dressing applied and wound care instructions given   Dressing type: bandage and petrolatum    Specimen 1 -  Surgical pathology Differential Diagnosis: R/O Atypia Cautery after biopsy Check Margins: No  Skin exam for malignant neoplasm Head - to toe  History of melanoma in situ Left Lower Back  Yearly skin exams  History of basal cell carcinoma (BCC) Chest - Medial (Center)  Yearly skin exam  AK (actinic keratosis) (13) Left Forearm - Posterior (2); Right Forearm - Posterior (6); Chest - Medial (Center) (4); Left Forehead  Destruction of lesion - Chest - Medial (Center), Left Forearm - Posterior, Left Forehead, Right Forearm - Posterior Complexity: simple   Destruction method: cryotherapy   Informed consent: discussed and consent obtained   Timeout:  patient name, date of birth, surgical site, and procedure verified Lesion destroyed using liquid nitrogen: Yes   Cryotherapy cycles:  1 Outcome: patient tolerated procedure well with no complications   Post-procedure details: wound care instructions given      I, Delayne Sanzo, PA-C, have reviewed all documentation's for this visit.  The documentation on 02/22/21 for the exam, diagnosis, procedures and orders are all accurate and complete.

## 2021-02-22 NOTE — Patient Instructions (Signed)

## 2021-03-14 ENCOUNTER — Ambulatory Visit: Payer: Medicare Other | Admitting: Physician Assistant

## 2021-04-30 IMAGING — RF DG C-ARM 1-60 MIN-NO REPORT
1 series · 4 of 4 positions shown · non-contrast
Comparison: None.

CLINICAL DATA: Right total hip replacement.

EXAM:
OPERATIVE RIGHT HIP (WITH PELVIS IF PERFORMED)
TECHNIQUE: Fluoroscopic spot image(s) were submitted for interpretation
post-operatively.

[Series 1: unknown protocol · 0.20mm/px · 4 of 4 slices shown]
[im 1/4]
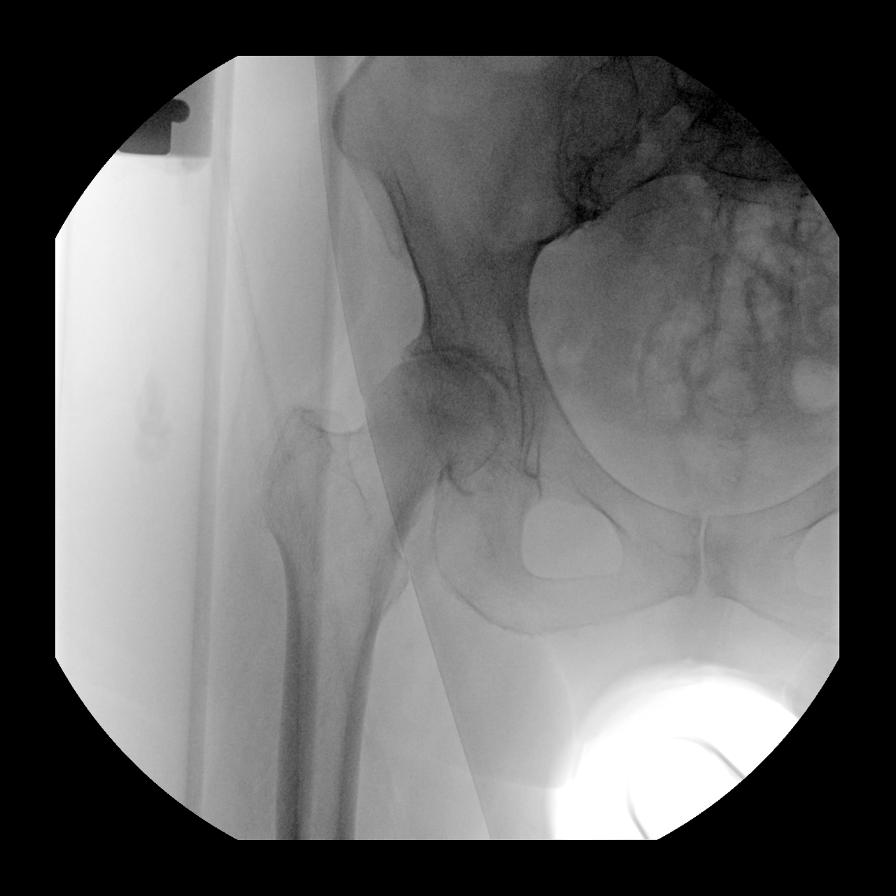
[im 2/4]
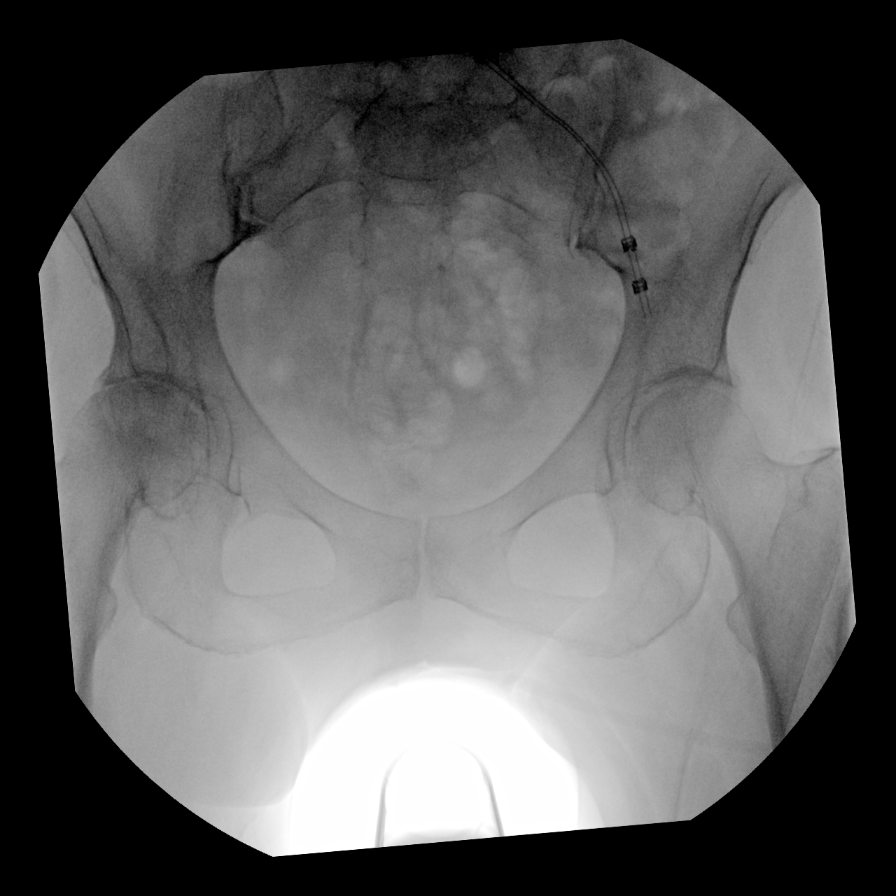
[im 3/4]
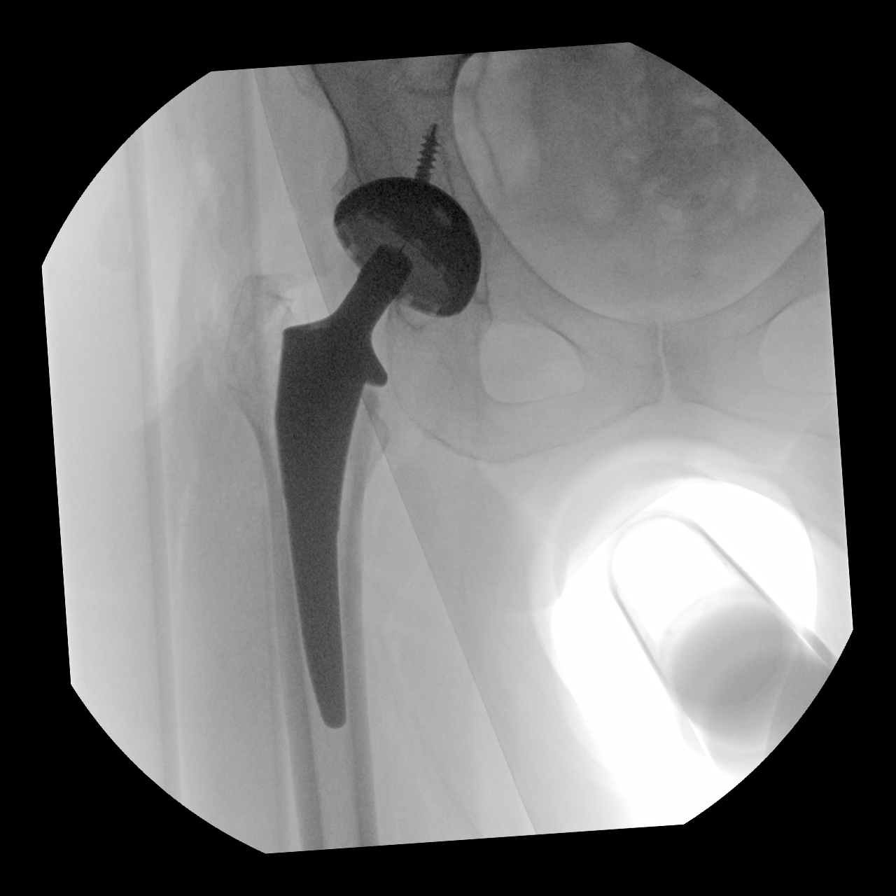
[im 4/4]
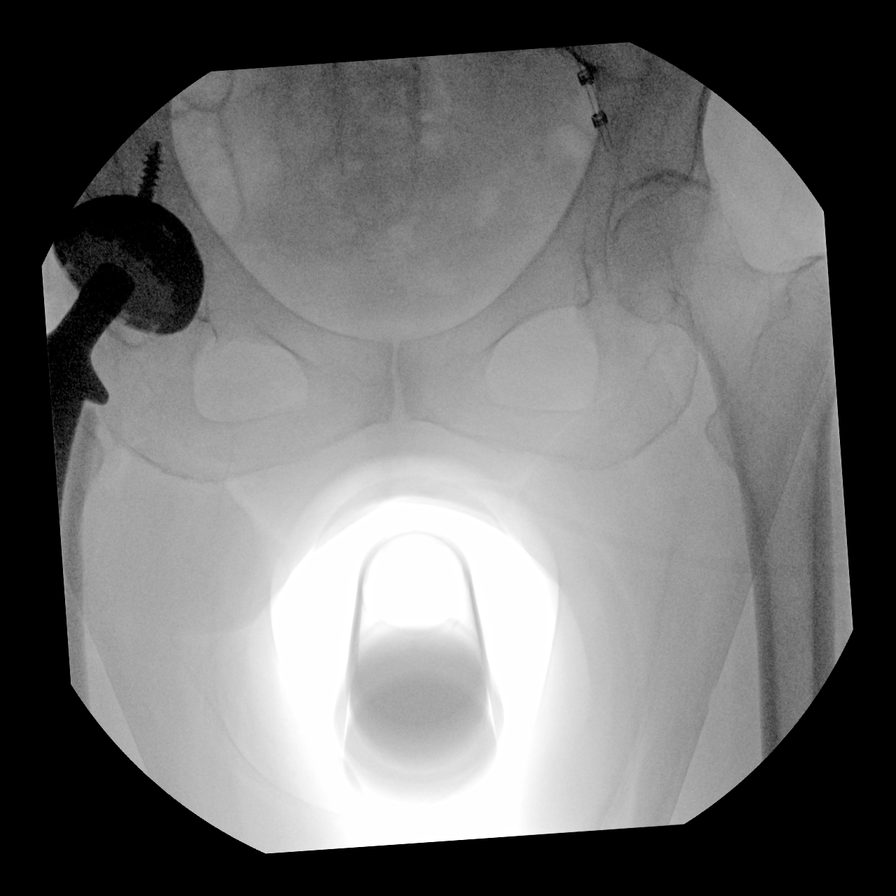

[4 of 4 positions shown; findings below may reference images not displayed]

FINDINGS: Four fluoroscopic spot views obtained in the operating room.
Interval right hip arthroplasty. Total fluoroscopy time 12 seconds.
Total dose 1.6 mGy.
IMPRESSION: Procedural fluoroscopy during right hip arthroplasty.

## 2021-04-30 IMAGING — RF DG HIP (WITH PELVIS) OPERATIVE*R*
1 series · 4 of 4 positions shown · non-contrast
Comparison: None.

CLINICAL DATA: Right total hip replacement.

EXAM:
OPERATIVE RIGHT HIP (WITH PELVIS IF PERFORMED)
TECHNIQUE: Fluoroscopic spot image(s) were submitted for interpretation
post-operatively.

[Series 1: unknown protocol · 0.20mm/px · 4 of 4 slices shown]
[im 1/4]
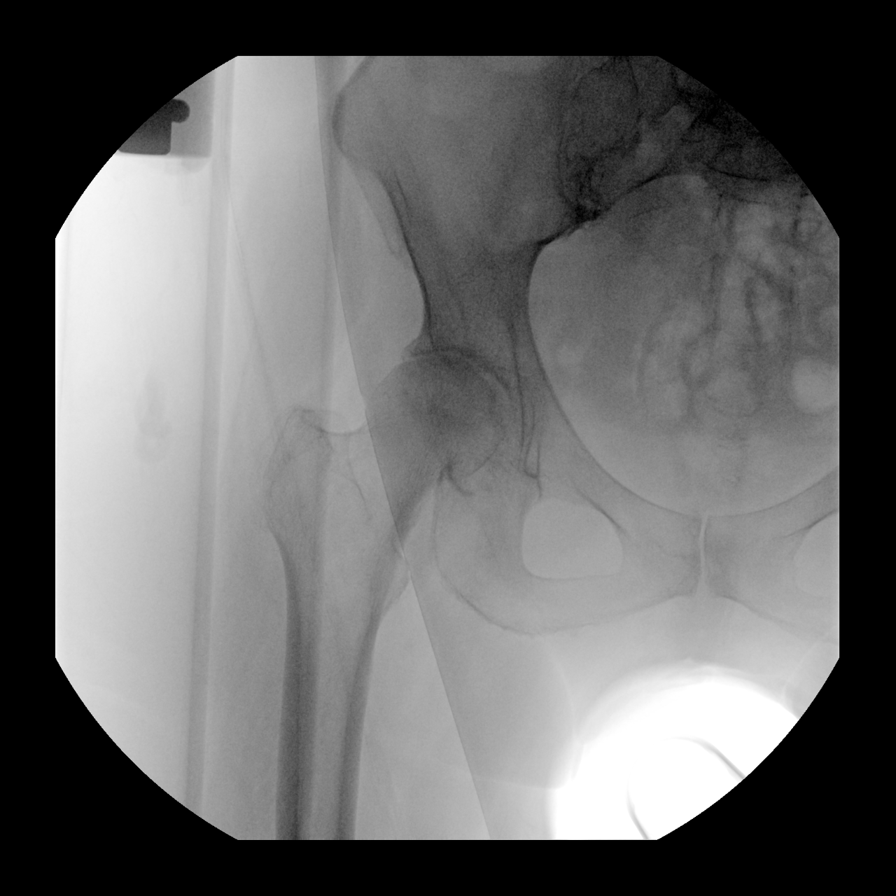
[im 2/4]
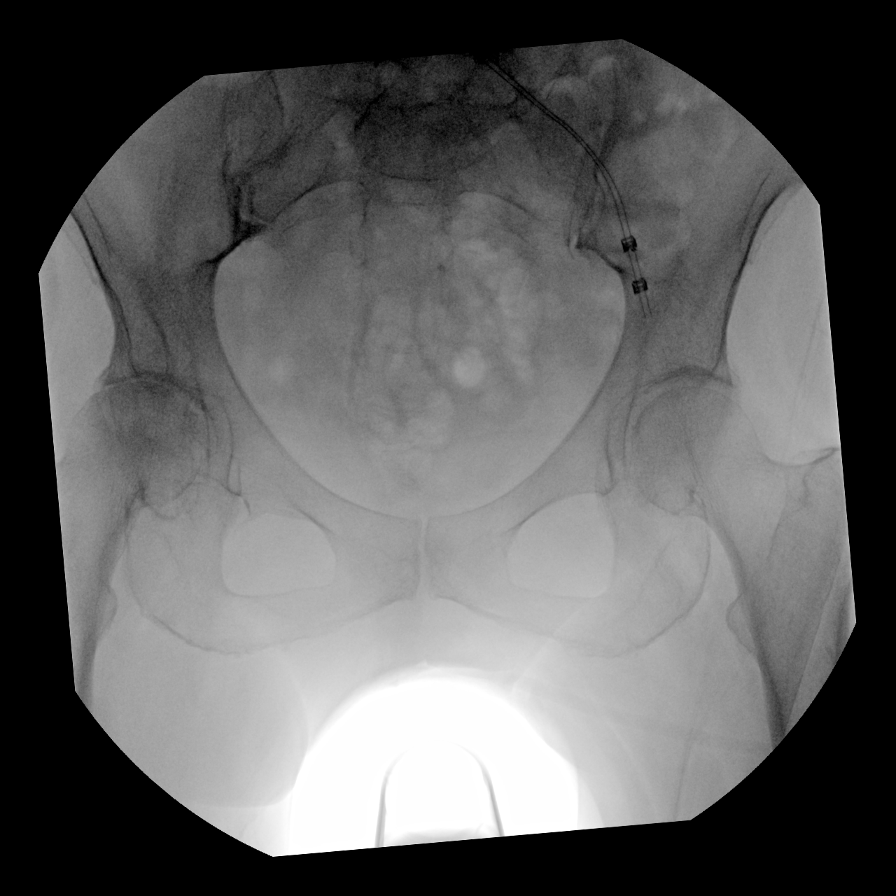
[im 3/4]
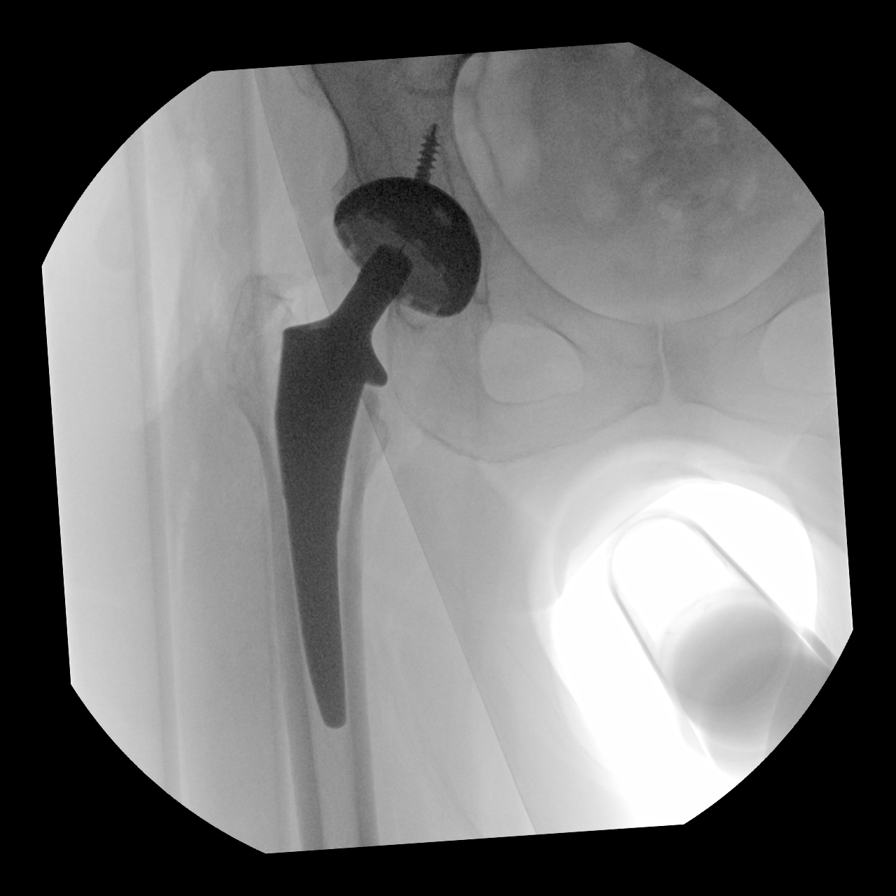
[im 4/4]
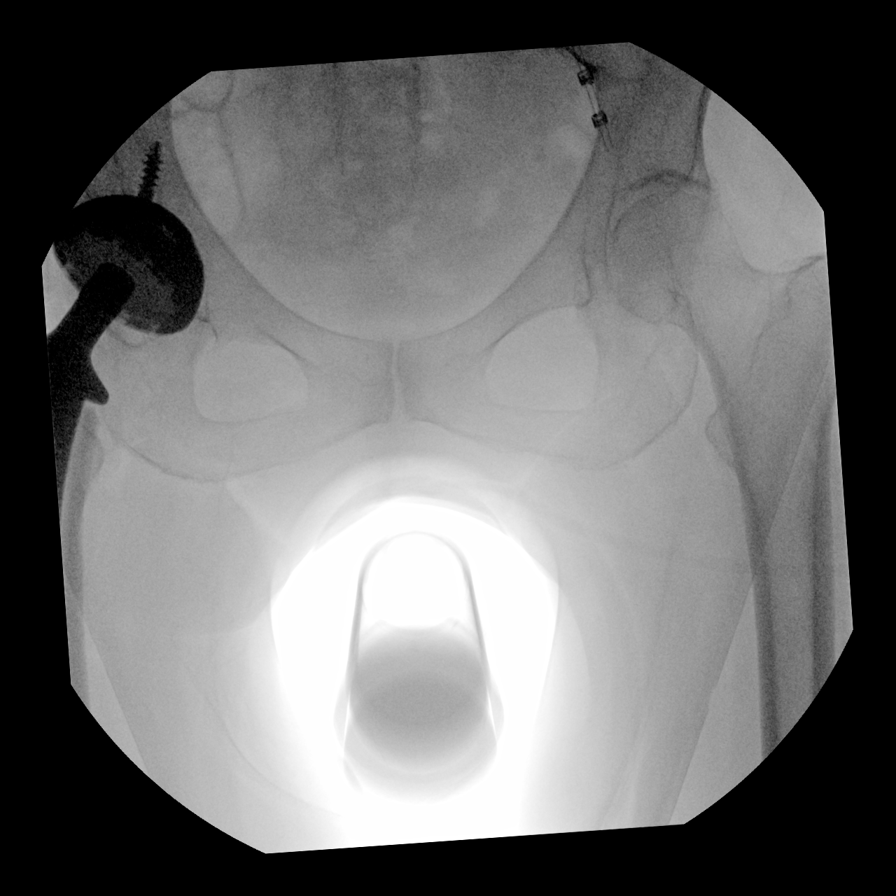

[4 of 4 positions shown; findings below may reference images not displayed]

FINDINGS: Four fluoroscopic spot views obtained in the operating room.
Interval right hip arthroplasty. Total fluoroscopy time 12 seconds.
Total dose 1.6 mGy.
IMPRESSION: Procedural fluoroscopy during right hip arthroplasty.

## 2021-05-22 LAB — HM MAMMOGRAPHY

## 2021-05-31 ENCOUNTER — Encounter: Payer: Self-pay | Admitting: Family Medicine

## 2021-05-31 LAB — HM MAMMOGRAPHY

## 2021-06-09 ENCOUNTER — Encounter: Payer: Self-pay | Admitting: Family Medicine

## 2021-07-04 ENCOUNTER — Encounter: Payer: Self-pay | Admitting: Cardiovascular Disease

## 2021-07-04 NOTE — Progress Notes (Signed)
Cardiology Office Note:    Date:  07/05/2021   ID:  SHALAYAH Armstrong, DOB 19-Aug-1956, MRN [id number]  PCP:  Caren Macadam, MD  Cardiologist:  Neill Jurewicz   Electrophysiologist:  None   Referring MD: Caren Macadam, MD   Chief Complaint  Patient presents with   Hypertension    History of Present Illness:    Elizabeth Armstrong is a 65 y.o. female with a hx of HTN is been having some palpitations for the past several weeks.  HR was elevated at baseline.   Felt flushed.  Occurred at work  BP was elevated several weeks ago  Noticed that her HR was "racing" for a few seconds.   Would last for few seconds .plays golf several tiems a week Light cardio and weights several times a week. Rides stationary bike   Has tried Bystolic ( from Dr. Doreatha Lew) .  Insurance stopped paying for it and she changed to amlodipine a year ago Has gained some weight .   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  Dec, 14,  2020   [name] is seen today for follow up of her HTN. BP has been a bit elevated Is exercising some  On Coreg 12. 5 BID,  Is off amlodipine  Takes Mobic 7.5 mg about every day  We will add HCTZ 25 mg a day and Kdur 10 meq a day    Jan. 4, 2022: [name] is seen for follow up of her HTN No cp, no dyspnea Has been visiting family for the past several months  Exercising well Playing lots of golf  Hip replacement in mid July   Aug. 3, 2022 [name] is seen today for follow up of her HTN She had poor R wave progression at her last EKG.  We performed an echocardiogram to evaluate her for the possibility of silent anterior wall myocardial infarction. Echocardiogram from December 08, 2020 reveals normal left ventricular systolic function.  She has grade 1 diastolic dysfunction.  Trivial mitral regurgitation.  Past  Medical History:  Diagnosis Date   Atypical mole 04/11/2007   SEVERE LEFT MID BACK- WIDER SHAVE   Atypical mole 01/15/2014   MODERATE-LEFT THIGH UPPER- NO TREATMENT   Atypical mole 08/03/2015   MODERATE-LEFT LOWER BACK- NO TREATMENT   Atypical mole 08/03/2015   SEVERE-LEFT UPPER PARASPINAL- WIDER SHAVE   Atypical mole 08/03/2015   MILD-RIGHT INNER KNEE-NO TREATMENT   Atypical mole 11/15/2015   SEVERE-RIGHT POST THIGH-WIDER SHAVE   Atypical mole 12/07/2016   MILD-LEFT LOW BACK- NO TREATMENT   B12 deficiency 04/17/2019   Basal cell carcinoma 01/15/2014   nodular chest-cx3,5FU   BCC (basal cell carcinoma of skin) 03/07/2017   NODULAR CHEST-TX AFTER BIOPSY   Essential hypertension 11/19/2014   Heart murmur    HTN (hypertension)    Hypercholesteremia    Melanoma in situ ([place]) 01/15/2014   LOWER LEFT BACK- MOHS DR. MERRITT   Melanoma of skin ([place]) 08/25/2015   sees dermatologist at least twice per year, Dr. Denna Haggard   Osteoarthritis 11/19/2014   Osteoarthrosis, unspecified whether generalized or localized, involving lower leg 11/19/2014   of knees, s/p knee replacements bilat, sees [facility]   Osteopenia 01/04/2017   Palpitations 08/25/2015   Palpitations    Pneumonia    Pure hypercholesterolemia 11/19/2014  SCCA (squamous cell carcinoma) of skin 11/13/2017   MID CHEST-C X 3, 5FU   Vaginal atrophy 08/25/2015    Past Surgical History:  Procedure Laterality Date   APPENDECTOMY  2009   CESAREAN SECTION     JOINT REPLACEMENT Bilateral    knees   KNEE SURGERY  2009   left   OPEN REDUCTION INTERNAL FIXATION (ORIF) METACARPAL Left 06/19/2018   Procedure: LEFT SMALL FINGER OPEN REDUCTION INTERNAL FIXATION (ORIF) CARPOMETACARPAL FUSION;  Surgeon: Leanora Cover, MD;  Location: [facility];  Service: Orthopedics;  Laterality: Left;   TOTAL HIP ARTHROPLASTY Right 06/14/2020   Procedure: TOTAL HIP ARTHROPLASTY ANTERIOR APPROACH;  Surgeon: Paralee Cancel, MD;  Location: [facility];   Service: Orthopedics;  Laterality: Right;  70 mins   WRIST SURGERY  2007   right    Current Medications: Current Meds  Medication Sig   carvedilol (COREG) 12.5 MG tablet Take 1 tablet (12.5 mg total) by mouth 2 (two) times daily.   hydrochlorothiazide (HYDRODIURIL) 25 MG tablet TAKE 1 TABLET BY MOUTH EVERY DAY   potassium chloride (KLOR-CON) 10 MEQ tablet TAKE 1 TABLET BY MOUTH EVERY DAY     Allergies:   Adhesive [tape]   Social History   Socioeconomic History   Marital status: Married    Spouse name: Not on file   Number of children: 1   Years of education: Not on file   Highest education level: Not on file  Occupational History   Occupation: Tree surgeon  Tobacco Use   Smoking status: Never   Smokeless tobacco: Never  Vaping Use   Vaping Use: Never used  Substance and Sexual Activity   Alcohol use: Yes    Alcohol/week: 3.0 standard drinks    Types: 3 Glasses of wine per week    Comment: social   Drug use: Never   Sexual activity: Not Currently  Other Topics Concern   Not on file  Social History Narrative   Work or School: Nurse, adult Situation: lives with husband      Spiritual Beliefs: catholic      Lifestyle: very active with cycling and tennis; healthy diet      Social Determinants of Health   Financial Resource Strain: Low Risk    Difficulty of Paying Living Expenses: Not hard at all  Food Insecurity: No Food Insecurity   Worried About Charity fundraiser in the Last Year: Never true   Arboriculturist in the Last Year: Never true  Transportation Needs: No Transportation Needs   Lack of Transportation (Medical): No   Lack of Transportation (Non-Medical): No  Physical Activity: Sufficiently Active   Days of Exercise per Week: 5 days   Minutes of Exercise per Session: 90 min  Stress: No Stress Concern Present   Feeling of Stress : Not at all  Social Connections: Moderately Integrated   Frequency of Communication with Friends and Family: More  than three times a week   Frequency of Social Gatherings with Friends and Family: Once a week   Attends Religious Services: 1 to 4 times per year   Active Member of [employer] or Organizations: No   Attends Archivist Meetings: Never   Marital Status: Married     Family History: The patient's family history includes Heart disease in an other family member.  ROS:   Please see the history of present illness.     All other systems reviewed and are negative.  EKGs/Labs/Other Studies Reviewed:    The following studies were reviewed today:    Recent Labs: 07/15/2020: ALT 9; BUN 14; Creat 0.81; Hemoglobin 12.5; Platelets 260; Potassium 4.5; Sodium 133  Recent Lipid Panel    Component Value Date/Time   CHOL 219 (H) 07/15/2020 0844   TRIG 64 07/15/2020 0844   HDL 82 07/15/2020 0844   CHOLHDL 2.7 07/15/2020 0844   VLDL 13.2 06/26/2019 0814   LDLCALC 121 (H) 07/15/2020 0844    Physical Exam:    Physical Exam: Blood pressure 140/88, pulse 60, height 5' 4.5" (1.638 m), weight 158 lb (71.7 kg), SpO2 98 %.  GEN:  Well nourished, well developed in no acute distress HEENT: Normal NECK: No JVD; No carotid bruits LYMPHATICS: No lymphadenopathy CARDIAC: RRR , no murmurs, rubs, gallops RESPIRATORY:  Clear to auscultation without rales, wheezing or rhonchi  ABDOMEN: Soft, non-tender, non-distended MUSCULOSKELETAL:  No edema; No deformity  SKIN: Warm and dry NEUROLOGIC:  Alert and oriented x 3   EKG:   July 05, 2021: Normal sinus rhythm.  Left bundle branch block.   ASSESSMENT:    1. DOE (dyspnea on exertion)   2. Left bundle branch block (LBBB)     PLAN:        Hypertension: Blood pressure is typically well controlled.  It is a little higher than normal today.  She has been on vacation and had a few hot dogs while on vacation.  I reminded her to continue to exercise.  Continue to watch her salt.  2.  LBBB : Today her EKG appears to be more consistent with a left  bundle branch block.  She has normal left ventricular systolic function by echo.  I would like to get his [place] study for further evaluation and to rule out ischemia.  She also has some symptoms of dyspnea on exertion on occasion.  We will see her again in 1 year for follow-up visit.  Medication Adjustments/Labs and Tests Ordered: Current medicines are reviewed at length with the patient today.  Concerns regarding medicines are outlined above.  Orders Placed This Encounter  Procedures   MYOCARDIAL PERFUSION IMAGING   EKG 12-Lead    No orders of the defined types were placed in this encounter.    Patient Instructions  Medication Instructions:  Your physician recommends that you continue on your current medications as directed. Please refer to the Current Medication list given to you today.  *If you need a refill on your cardiac medications before your next appointment, please call your pharmacy*   Lab Work: None Ordered If you have labs (blood work) drawn today and your tests are completely normal, you will receive your results only by: [place] (if you have MyChart) OR A paper copy in the mail If you have any lab test that is abnormal or we need to change your treatment, we will call you to review the results.   Testing/Procedures: Your physician has requested that you have a lexiscan myoview. For further information please visit [email or web address]. Please follow instruction sheet, as given.    Follow-Up: At [facility], you and your health needs are our priority.  As part of our continuing mission to provide you with exceptional heart care, we have created designated Provider Care Teams.  These Care Teams include your primary Cardiologist (physician) and Advanced Practice Providers (APPs -  Physician Assistants and Nurse Practitioners) who all work together to provide you with the care you need, when you need it.  Your next appointment:   1  year(s)  The format for your next appointment:   In Person  Provider:   You may see Mertie Moores, MD or one of the following Advanced Practice Providers on your designated Care Team:   Richardson Dopp, PA-C Robbie Lis, [place]     Signed, Mertie Moores, MD  07/05/2021 6:21 PM    [place]

## 2021-07-05 ENCOUNTER — Encounter: Payer: Self-pay | Admitting: Cardiovascular Disease

## 2021-07-05 ENCOUNTER — Ambulatory Visit (INDEPENDENT_AMBULATORY_CARE_PROVIDER_SITE_OTHER): Payer: Medicare Other | Admitting: Cardiovascular Disease

## 2021-07-05 ENCOUNTER — Other Ambulatory Visit: Payer: Self-pay

## 2021-07-05 ENCOUNTER — Encounter: Payer: Self-pay | Admitting: Nurse Practitioner

## 2021-07-05 VITALS — BP 140/88 | HR 60 | Ht 64.5 in | Wt 158.0 lb

## 2021-07-05 DIAGNOSIS — R0609 Other forms of dyspnea: Secondary | ICD-10-CM

## 2021-07-05 DIAGNOSIS — I10 Essential (primary) hypertension: Secondary | ICD-10-CM | POA: Diagnosis not present

## 2021-07-05 DIAGNOSIS — I447 Left bundle-branch block, unspecified: Secondary | ICD-10-CM

## 2021-07-05 DIAGNOSIS — R0600 Dyspnea, unspecified: Secondary | ICD-10-CM | POA: Diagnosis not present

## 2021-07-05 NOTE — Patient Instructions (Signed)
Medication Instructions:  Your physician recommends that you continue on your current medications as directed. Please refer to the Current Medication list given to you today.  *If you need a refill on your cardiac medications before your next appointment, please call your pharmacy*   Lab Work: None Ordered If you have labs (blood work) drawn today and your tests are completely normal, you will receive your results only by: [place] (if you have MyChart) OR A paper copy in the mail If you have any lab test that is abnormal or we need to change your treatment, we will call you to review the results.   Testing/Procedures: Your physician has requested that you have a lexiscan myoview. For further information please visit [email or web address]. Please follow instruction sheet, as given.    Follow-Up: At [facility], you and your health needs are our priority.  As part of our continuing mission to provide you with exceptional heart care, we have created designated Provider Care Teams.  These Care Teams include your primary Cardiologist (physician) and Advanced Practice Providers (APPs -  Physician Assistants and Nurse Practitioners) who all work together to provide you with the care you need, when you need it.   Your next appointment:   1 year(s)  The format for your next appointment:   In Person  Provider:   You may see Mertie Moores, MD or one of the following Advanced Practice Providers on your designated Care Team:   Richardson Dopp, PA-C [place], [place]

## 2021-07-06 ENCOUNTER — Telehealth (HOSPITAL_COMMUNITY): Payer: Self-pay | Admitting: *Deleted

## 2021-07-06 NOTE — Telephone Encounter (Signed)
.  tc

## 2021-07-07 ENCOUNTER — Telehealth: Payer: Self-pay | Admitting: *Deleted

## 2021-07-07 NOTE — Addendum Note (Signed)
Addended by: Emmaline Life on: 07/07/2021 07:55 AM   Modules accepted: Orders

## 2021-07-07 NOTE — Addendum Note (Signed)
Addended by: Thayer Headings on: 07/07/2021 11:12 AM   Modules accepted: Orders

## 2021-07-07 NOTE — Telephone Encounter (Signed)
   [facility] Pre-operative Risk Assessment    Patient Name: Elizabeth Armstrong  DOB: 01/08/1956 MRN: [id number]  HEARTCARE STAFF:  - IMPORTANT!!!!!! Under Visit Info/Reason for Call, type in Other and utilize the format Clearance MM/DD/YY or Clearance TBD. Do not use dashes or single digits. - Please review there is not already an duplicate clearance open for this procedure. - If request is for dental extraction, please clarify the # of teeth to be extracted. - If the patient is currently at the dentist's office, call Pre-Op Callback Staff (MA/nurse) to input urgent request.  - If the patient is not currently in the dentist office, please route to the Pre-Op pool.  Request for surgical clearance:  What type of surgery is being performed?  COLONOSCOPY  When is this surgery scheduled?  08/15/2021  What type of clearance is required (medical clearance vs. Pharmacy clearance to hold med vs. Both)?  MEDICAL  Are there any medications that need to be held prior to surgery and how long?  N/A  Practice name and name of physician performing surgery?  [facility] / DR. HUNG  What is the office phone number?  [phone]   7.   What is the office fax number?  [phone]  8.   Anesthesia type (None, local, MAC, general) ?  PROPOFOL   Khamil, [place] 07/07/2021, 3:29 PM  _________________________________________________________________   (provider comments below)

## 2021-07-11 NOTE — Telephone Encounter (Signed)
Pending myoview 8/12

## 2021-07-14 ENCOUNTER — Other Ambulatory Visit: Payer: Self-pay

## 2021-07-14 ENCOUNTER — Ambulatory Visit (HOSPITAL_COMMUNITY): Payer: Medicare Other | Attending: Cardiology

## 2021-07-14 DIAGNOSIS — R0600 Dyspnea, unspecified: Secondary | ICD-10-CM | POA: Diagnosis not present

## 2021-07-14 DIAGNOSIS — I447 Left bundle-branch block, unspecified: Secondary | ICD-10-CM | POA: Diagnosis not present

## 2021-07-14 DIAGNOSIS — R0609 Other forms of dyspnea: Secondary | ICD-10-CM

## 2021-07-14 LAB — MYOCARDIAL PERFUSION IMAGING
LV dias vol: 79 mL (ref 46–106)
LV sys vol: 24 mL
Peak HR: 83 {beats}/min
Rest HR: 64 {beats}/min
SDS: 0
SRS: 0
SSS: 0
TID: 0.89

## 2021-07-14 MED ORDER — TECHNETIUM TC 99M TETROFOSMIN IV KIT
10.6000 | PACK | Freq: Once | INTRAVENOUS | Status: AC | PRN
  Administered 2021-07-14: 10.6 via INTRAVENOUS
  Filled 2021-07-14: qty 11

## 2021-07-14 MED ORDER — TECHNETIUM TC 99M TETROFOSMIN IV KIT
30.6000 | PACK | Freq: Once | INTRAVENOUS | Status: AC | PRN
  Administered 2021-07-14: 30.6 via INTRAVENOUS
  Filled 2021-07-14: qty 31

## 2021-07-14 MED ORDER — REGADENOSON 0.4 MG/5ML IV SOLN
0.4000 mg | Freq: Once | INTRAVENOUS | Status: AC
  Administered 2021-07-14: 0.4 mg via INTRAVENOUS

## 2021-07-18 NOTE — Telephone Encounter (Signed)
    Patient Name: Elizabeth Armstrong  DOB: 1956-11-20 MRN: [id number]  Primary Cardiologist: Mertie Moores, MD  Chart reviewed as part of pre-operative protocol coverage. She had a nuclear stress test 07/14/21 which was low risk and without ischemia, therefore Elizabeth Armstrong would be at acceptable risk for the planned procedure without further cardiovascular testing.   I will route this recommendation to the requesting party via Epic fax function and remove from pre-op pool.  Please call with questions.  Abigail Butts, PA-C 07/18/2021, 3:51 PM

## 2021-08-15 LAB — HM COLONOSCOPY

## 2021-08-20 ENCOUNTER — Encounter: Payer: Self-pay | Admitting: Family Medicine

## 2021-09-13 ENCOUNTER — Encounter: Payer: Self-pay | Admitting: Family Medicine

## 2021-10-08 ENCOUNTER — Encounter (HOSPITAL_BASED_OUTPATIENT_CLINIC_OR_DEPARTMENT_OTHER): Payer: Self-pay | Admitting: Obstetrics and Gynecology

## 2021-10-08 ENCOUNTER — Emergency Department (HOSPITAL_BASED_OUTPATIENT_CLINIC_OR_DEPARTMENT_OTHER)
Admission: EM | Admit: 2021-10-08 | Discharge: 2021-10-08 | Disposition: A | Discharge status: Home / Self Care | Payer: Medicare Other | Attending: Emergency Medicine | Admitting: Emergency Medicine

## 2021-10-08 ENCOUNTER — Other Ambulatory Visit: Payer: Self-pay

## 2021-10-08 ENCOUNTER — Emergency Department (HOSPITAL_BASED_OUTPATIENT_CLINIC_OR_DEPARTMENT_OTHER): Payer: Medicare Other

## 2021-10-08 DIAGNOSIS — Z96641 Presence of right artificial hip joint: Secondary | ICD-10-CM | POA: Diagnosis not present

## 2021-10-08 DIAGNOSIS — Z79899 Other long term (current) drug therapy: Secondary | ICD-10-CM | POA: Diagnosis not present

## 2021-10-08 DIAGNOSIS — Z85820 Personal history of malignant melanoma of skin: Secondary | ICD-10-CM | POA: Diagnosis not present

## 2021-10-08 DIAGNOSIS — I10 Essential (primary) hypertension: Secondary | ICD-10-CM | POA: Diagnosis not present

## 2021-10-08 DIAGNOSIS — R42 Dizziness and giddiness: Secondary | ICD-10-CM | POA: Diagnosis present

## 2021-10-08 LAB — BASIC METABOLIC PANEL
Anion gap: 7 (ref 5–15)
BUN: 15 mg/dL (ref 8–23)
CO2: 30 mmol/L (ref 22–32)
Calcium: 9.8 mg/dL (ref 8.9–10.3)
Chloride: 97 mmol/L — ABNORMAL LOW (ref 98–111)
Creatinine, Ser: 0.62 mg/dL (ref 0.44–1.00)
GFR, Estimated: >60 mL/min (ref >60)
Glucose, Bld: 107 mg/dL — ABNORMAL HIGH (ref 70–99)
Potassium: 3.8 mmol/L (ref 3.5–5.1)
Sodium: 134 mmol/L — ABNORMAL LOW (ref 135–145)

## 2021-10-08 LAB — CBC
HCT: 39.4 % (ref 36.0–46.0)
Hemoglobin: 13.6 g/dL (ref 12.0–15.0)
MCH: 33.7 pg (ref 26.0–34.0)
MCHC: 34.5 g/dL (ref 30.0–36.0)
MCV: 97.8 fL (ref 80.0–100.0)
Platelets: 294 10*3/uL (ref 150–400)
RBC: 4.03 MIL/uL (ref 3.87–5.11)
RDW: 12.9 % (ref 11.5–15.5)
WBC: 6.3 10*3/uL (ref 4.0–10.5)
nRBC: 0 % (ref 0.0–0.2)

## 2021-10-08 MED ORDER — AMLODIPINE BESYLATE 5 MG PO TABS
5.0000 mg | ORAL_TABLET | Freq: Once | ORAL | Status: DC
  Filled 2021-10-08: qty 1

## 2021-10-08 MED ORDER — AMLODIPINE BESYLATE 5 MG PO TABS: 5.0000 mg | ORAL_TABLET | Freq: Every day | ORAL | 0 refills | Status: DC

## 2021-10-08 MED ORDER — MECLIZINE HCL 25 MG PO TABS: 25.0000 mg | ORAL_TABLET | Freq: Three times a day (TID) | ORAL | 0 refills | Status: DC | PRN

## 2021-10-08 NOTE — Discharge Instructions (Signed)
Take the medications as needed for dizziness.  Follow-up with your primary care doctor or cardiologist to check on your blood pressure.  Return as needed for worsening symptoms

## 2021-10-08 NOTE — ED Notes (Signed)
Bp 146/87, will hold amlodipine.

## 2021-10-08 NOTE — ED Provider Notes (Signed)
[facility] EMERGENCY DEPT Provider Note   CSN: [id number] Arrival date & time: 10/08/21  1212     History Chief Complaint  Patient presents with   Dizziness    Elizabeth Armstrong is a 66 y.o. female.   Dizziness  Patient presented to the ED for evaluation of dizziness.  Patient states she noticed this morning that she was having trouble with ringing in her ears feeling dizzy mild headache.  She denied any nausea or emesis.  Patient took her blood pressure and it was elevated.  She was concerned about the unsteadiness and was worried that she might be developing a stroke.  She is not have any trouble with her vision.  No trouble with her speech.  She has not noticed any focal numbness or weakness.  Past Medical History:  Diagnosis Date   Atypical mole 04/11/2007   SEVERE LEFT MID BACK- WIDER SHAVE   Atypical mole 01/15/2014   MODERATE-LEFT THIGH UPPER- NO TREATMENT   Atypical mole 08/03/2015   MODERATE-LEFT LOWER BACK- NO TREATMENT   Atypical mole 08/03/2015   SEVERE-LEFT UPPER PARASPINAL- WIDER SHAVE   Atypical mole 08/03/2015   MILD-RIGHT INNER KNEE-NO TREATMENT   Atypical mole 11/15/2015   SEVERE-RIGHT POST THIGH-WIDER SHAVE   Atypical mole 12/07/2016   MILD-LEFT LOW BACK- NO TREATMENT   B12 deficiency 04/17/2019   Basal cell carcinoma 01/15/2014   nodular chest-cx3,5FU   BCC (basal cell carcinoma of skin) 03/07/2017   NODULAR CHEST-TX AFTER BIOPSY   Essential hypertension 11/19/2014   Heart murmur    HTN (hypertension)    Hypercholesteremia    Melanoma in situ ([place]) 01/15/2014   LOWER LEFT BACK- MOHS DR. MERRITT   Melanoma of skin ([place]) 08/25/2015   sees dermatologist at least twice per year, Dr. Denna Haggard   Osteoarthritis 11/19/2014   Osteoarthrosis, unspecified whether generalized or localized, involving lower leg 11/19/2014   of knees, s/p knee replacements bilat, sees [facility]   Osteopenia 01/04/2017   Palpitations 08/25/2015   Palpitations     Pneumonia    Pure hypercholesterolemia 11/19/2014   SCCA (squamous cell carcinoma) of skin 11/13/2017   MID CHEST-C X 3, 5FU   Vaginal atrophy 08/25/2015    Patient Active Problem List   Diagnosis Date Noted   LBBB (left bundle branch block) 07/05/2021   Nonspecific abnormal electrocardiogram (ECG) (EKG) 12/06/2020   Overweight (BMI 25.0-29.9) 06/15/2020   Osteoarthritis of right hip 06/14/2020   Status post right hip replacement 06/14/2020   B12 deficiency 04/17/2019   Osteoporosis 03/12/2017   Melanoma of skin ([place]) 08/25/2015   Palpitations 08/25/2015   Vaginal atrophy 08/25/2015   Essential hypertension 11/19/2014   Pure hypercholesterolemia 11/19/2014   Osteoarthritis 11/19/2014    Past Surgical History:  Procedure Laterality Date   APPENDECTOMY  2009   CESAREAN SECTION     JOINT REPLACEMENT Bilateral    knees   KNEE SURGERY  2009   left   OPEN REDUCTION INTERNAL FIXATION (ORIF) METACARPAL Left 06/19/2018   Procedure: LEFT SMALL FINGER OPEN REDUCTION INTERNAL FIXATION (ORIF) CARPOMETACARPAL FUSION;  Surgeon: Leanora Cover, MD;  Location: [facility];  Service: Orthopedics;  Laterality: Left;   TOTAL HIP ARTHROPLASTY Right 06/14/2020   Procedure: TOTAL HIP ARTHROPLASTY ANTERIOR APPROACH;  Surgeon: Paralee Cancel, MD;  [facility];  Service: Orthopedics;  Laterality: Right;  70 mins   WRIST SURGERY  2007   right     OB History   No obstetric history on file.  Family History  Problem Relation Age of Onset   Heart disease Other     Social History   Tobacco Use   Smoking status: Never   Smokeless tobacco: Never  Vaping Use   Vaping Use: Never used  Substance Use Topics   Alcohol use: Yes    Alcohol/week: 3.0 standard drinks    Types: 3 Glasses of wine per week    Comment: social   Drug use: Never    Home Medications Prior to Admission medications   Medication Sig Start Date End Date Taking? Authorizing Provider  amLODipine (NORVASC)  5 MG tablet Take 1 tablet (5 mg total) by mouth daily. 10/08/21 11/07/21 Yes Dorie Rank, MD  carvedilol (COREG) 12.5 MG tablet Take 1 tablet (12.5 mg total) by mouth 2 (two) times daily. 09/08/20  Yes Nahser, Wonda Cheng, MD  hydrochlorothiazide (HYDRODIURIL) 25 MG tablet TAKE 1 TABLET BY MOUTH EVERY DAY 09/08/20  Yes Nahser, Wonda Cheng, MD  meclizine (ANTIVERT) 25 MG tablet Take 1 tablet (25 mg total) by mouth 3 (three) times daily as needed for dizziness. 10/08/21  Yes Dorie Rank, MD  potassium chloride (KLOR-CON) 10 MEQ tablet TAKE 1 TABLET BY MOUTH EVERY DAY 09/08/20   Nahser, Wonda Cheng, MD    Allergies    Adhesive [tape]  Review of Systems   Review of Systems  Neurological:  Positive for dizziness.  All other systems reviewed and are negative.  Physical Exam Updated Vital Signs BP (!) 158/74   Pulse (!) 58   Temp 98 F (36.7 C) (Oral)   Resp 17   SpO2 98%   Physical Exam Vitals and nursing note reviewed.  Constitutional:      General: She is not in acute distress.    Appearance: She is well-developed.  HENT:     Head: Normocephalic and atraumatic.     Right Ear: External ear normal.     Left Ear: External ear normal.  Eyes:     General: No scleral icterus.       Right eye: No discharge.        Left eye: No discharge.     Conjunctiva/sclera: Conjunctivae normal.  Neck:     Trachea: No tracheal deviation.  Cardiovascular:     Rate and Rhythm: Normal rate and regular rhythm.  Pulmonary:     Effort: Pulmonary effort is normal. No respiratory distress.     Breath sounds: Normal breath sounds. No stridor. No wheezing or rales.  Abdominal:     General: Bowel sounds are normal. There is no distension.     Palpations: Abdomen is soft.     Tenderness: There is no abdominal tenderness. There is no guarding or rebound.  Musculoskeletal:        General: No tenderness.     Cervical back: Neck supple.  Skin:    General: Skin is warm and dry.     Findings: No rash.  Neurological:      Mental Status: She is alert and oriented to person, place, and time.     Cranial Nerves: No cranial nerve deficit (No facial droop, extraocular movements intact, tongue midline ).     Sensory: No sensory deficit.     Motor: No abnormal muscle tone or seizure activity.     Coordination: Coordination normal.     Comments: No pronator drift bilateral upper extrem, able to hold both legs off bed for 5 seconds, sensation intact in all extremities, no visual field cuts, no left or  right sided neglect, normal finger-nose exam bilaterally, no nystagmus noted     ED Results / Procedures / Treatments   Labs (all labs ordered are listed, but only abnormal results are displayed) Labs Reviewed  BASIC METABOLIC PANEL - Abnormal; Notable for the following components:      Result Value   Sodium 134 (*)    Chloride 97 (*)    Glucose, Bld 107 (*)    All other components within normal limits  CBC    EKG EKG Interpretation  Date/Time:  Sunday October 08 2021 13:29:02 EST Ventricular Rate:  62 PR Interval:  160 QRS Duration: 130 QT Interval:  485 QTC Calculation: 493 R Axis:   -7 Text Interpretation: Sinus rhythm IVCD, consider atypical LBBB No significant change since last tracing Confirmed by Dorie Rank [phone]) on 10/08/2021 1:52:43 PM  Radiology CT Head Wo Contrast  Result Date: 10/08/2021 CLINICAL DATA:  Acute neurologic deficit. Stroke suspected. Dizziness and hypertension. EXAM: CT HEAD WITHOUT CONTRAST TECHNIQUE: Contiguous axial images were obtained from the base of the skull through the vertex without intravenous contrast. COMPARISON:  04/12/2018 FINDINGS: Brain: No evidence of acute infarction, hemorrhage, hydrocephalus, extra-axial collection, or mass lesion/mass effect. Vascular:  No hyperdense vessel or other acute findings. Skull: No evidence of fracture or other significant bone abnormality. Sinuses/Orbits:  No acute findings. Other: None. IMPRESSION: Negative noncontrast head CT.  Electronically Signed   By: Marlaine Hind M.D.   On: 10/08/2021 14:55    Procedures Procedures   Medications Ordered in ED Medications  amLODipine (NORVASC) tablet 5 mg (has no administration in time range)    ED Course  I have reviewed the triage vital signs and the nursing notes.  Pertinent labs & imaging results that were available during my care of the patient were reviewed by me and considered in my medical decision making (see chart for details).  Clinical Course as of 10/08/21 1539  Sun Oct 08, 2021  1516 Patient CBC and metabolic panel unremarkable.  CT scan of the head without acute findings [VZ]  8588 Blood pressure has improved without intervention. [JK]    Clinical Course User Index [JK] Dorie Rank, MD   MDM Rules/Calculators/A&P                           Patient presented with dizziness and hypertension.  Patient was concerned about the possibility of stroke.  No focal neurodeficits noted on my exam.  Head CT does not show hemorrhage or other abnormality.  Patient is able to ambulate without difficulties.  At this time I have low suspicion for occult stroke.  MRI is not available at this freestanding emergency room but I do not feel the patient requires transfer to have an MRI performed based on her exam and findings.  Patient was noted to be hypertensive.  Her blood pressure has improved.  It is possible her dizziness and discomfort triggered her hypertension versus hypertension causing the symptoms.  Patient is having some ear ringing and worsening of the symptoms with head movement.  We will try course of meclizine discussed Epley's maneuvers.  Patient will follow up with her PCP.  Warning signs precautions discussed Final Clinical Impression(s) / ED Diagnoses Final diagnoses:  Hypertension, unspecified type  Vertigo    Rx / DC Orders ED Discharge Orders          Ordered    meclizine (ANTIVERT) 25 MG tablet  3 times daily PRN  10/08/21 1536    amLODipine  (NORVASC) 5 MG tablet  Daily        10/08/21 1536             Dorie Rank, MD 10/08/21 1539

## 2021-10-08 NOTE — ED Notes (Signed)
Patient transported to CT 

## 2021-10-08 NOTE — ED Triage Notes (Signed)
Patient reports to the ER for high blood pressure, dizziness, ringing in her ears, and denies nausea or emesis.

## 2021-10-10 ENCOUNTER — Other Ambulatory Visit: Payer: Self-pay | Admitting: Cardiovascular Disease

## 2021-12-25 ENCOUNTER — Encounter: Payer: Self-pay | Admitting: Cardiovascular Disease

## 2021-12-25 NOTE — Progress Notes (Signed)
Cardiology Office Note:    Date:  12/26/2021   ID:  Elizabeth Armstrong, DOB Dec 16, 1955, MRN [id number]  PCP:  Caren Macadam, MD  Cardiologist:  Fynn Adel   Electrophysiologist:  None   Referring MD: Caren Macadam, MD   Chief Complaint  Patient presents with   Hypertension        Shortness of Breath         History of Present Illness:    Elizabeth Armstrong is a 66 y.o. female with a hx of HTN is been having some palpitations for the past several weeks.  HR was elevated at baseline.   Felt flushed.  Occurred at work  BP was elevated several weeks ago  Noticed that her HR was "racing" for a few seconds.   Would last for few seconds .plays golf several tiems a week Light cardio and weights several times a week. Rides stationary bike   Has tried Bystolic ( from Dr. Doreatha Lew) .  Insurance stopped paying for it and she changed to amlodipine a year ago Has gained some weight .   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  Dec, 14,  2020   Elizabeth Armstrong is seen today for follow up of her HTN. BP has been a bit elevated Is exercising some  On Coreg 12. 5 BID,  Is off amlodipine  Takes Mobic 7.5 mg about every day  We will add HCTZ 25 mg a day and Kdur 10 meq a day    Jan. 4, 2022: Elizabeth Armstrong is seen for follow up of her HTN No cp, no dyspnea Has been visiting family for the past several months  Exercising well Playing lots of golf  Hip replacement in mid July   Aug. 3, 2022 Elizabeth Armstrong is seen today for follow up of her HTN She had poor R wave progression at her last EKG.  We performed an echocardiogram to evaluate her for the possibility of silent anterior wall myocardial infarction. Echocardiogram from December 08, 2020 reveals normal left ventricular systolic function.  She has grade 1 diastolic dysfunction.   Trivial mitral regurgitation.  Jan. 24, 2023: Elizabeth Armstrong is seen today for follow up of her HTN and LBBB Lexiscan myoview showed no ischemia and no infarction Is having some dizziness, Also has tinnitis - worsened recently  Woke up dizzy one morning, worsened Unable to walk without assistance  Is able to exercise now Has been in [place] for 2 months  - visiting daughter .           Past Medical History:  Diagnosis Date   Atypical mole 04/11/2007   SEVERE LEFT MID BACK- WIDER SHAVE   Atypical mole 01/15/2014   MODERATE-LEFT THIGH UPPER- NO TREATMENT   Atypical mole 08/03/2015   MODERATE-LEFT LOWER BACK- NO TREATMENT   Atypical mole 08/03/2015   SEVERE-LEFT UPPER PARASPINAL- WIDER SHAVE   Atypical mole 08/03/2015   MILD-RIGHT INNER KNEE-NO TREATMENT   Atypical mole 11/15/2015   SEVERE-RIGHT POST THIGH-WIDER SHAVE   Atypical mole 12/07/2016   MILD-LEFT LOW BACK- NO TREATMENT   B12 deficiency 04/17/2019   Basal cell carcinoma 01/15/2014   nodular chest-cx3,5FU   BCC (basal cell carcinoma of skin) 03/07/2017   NODULAR CHEST-TX AFTER BIOPSY   Essential hypertension 11/19/2014   Heart murmur    HTN (hypertension)  Hypercholesteremia    Melanoma in situ ([facility]) 01/15/2014   LOWER LEFT BACK- MOHS DR. Manley Mason   Melanoma of skin ([facility]) 08/25/2015   sees dermatologist at least twice per year, Dr. Denna Haggard   Osteoarthritis 11/19/2014   Osteoarthrosis, unspecified whether generalized or localized, involving lower leg 11/19/2014   of knees, s/p knee replacements bilat, sees [facility] orth[facility]   Osteopenia 01/04/2017   Palpitations 08/25/2015   Palpitations    Pneumonia    Pure hypercholesterolemia 11/19/2014   SCCA (squamous cell carcinoma) of skin 11/13/2017   MID CHEST-C X 3, 5FU   Vaginal atrophy 08/25/2015    Past Surgical History:  Procedure Laterality Date   APPENDECTOMY  2009   CESAREAN SECTION     JOINT REPLACEMENT Bilateral    knees   KNEE SURGERY  2009   left   OPEN  REDUCTION INTERNAL FIXATION (ORIF) METACARPAL Left 06/19/2018   Procedure: LEFT SMALL FINGER OPEN REDUCTION INTERNAL FIXATION (ORIF) CARPOMETACARPAL FUSION;  Surgeon: Leanora Cover, MD;  Location: [facility];  Service: Orthopedics;  Laterality: Left;   TOTAL HIP ARTHROPLASTY Right 06/14/2020   Procedure: TOTAL HIP ARTHROPLASTY ANTERIOR APPROACH;  Surgeon: Paralee Cancel, MD;  Location: [facility];  Service: Orthopedics;  Laterality: Right;  70 mins   WRIST SURGERY  2007   right    Current Medications: Current Meds  Medication Sig   carvedilol (COREG) 12.5 MG tablet TAKE 1 TABLET BY MOUTH 2 TIMES DAILY.   Cyanocobalamin (VITAMIN B-12 PO) Take by mouth.   hydrochlorothiazide (HYDRODIURIL) 25 MG tablet TAKE 1 TABLET BY MOUTH EVERY DAY   potassium chloride (KLOR-CON) 10 MEQ tablet TAKE 1 TABLET BY MOUTH EVERY DAY   VITAMIN D PO Take by mouth.     Allergies:   Adhesive [tape]   Social History   Socioeconomic History   Marital status: Married    Spouse name: Not on file   Number of children: 1   Years of education: Not on file   Highest education level: Not on file  Occupational History   Occupation: Tree surgeon  Tobacco Use   Smoking status: Never   Smokeless tobacco: Never  Vaping Use   Vaping Use: Never used  Substance and Sexual Activity   Alcohol use: Yes    Alcohol/week: 3.0 standard drinks    Types: 3 Glasses of wine per week    Comment: social   Drug use: Never   Sexual activity: Not Currently  Other Topics Concern   Not on file  Social History Narrative   Work or School: Nurse, adult Situation: lives with husband      Spiritual Beliefs: catholic      Lifestyle: very active with cycling and tennis; healthy diet      Social Determinants of Health   Financial Resource Strain: Low Risk    Difficulty of Paying Living Expenses: Not hard at all  Food Insecurity: No Food Insecurity   Worried About Charity fundraiser in the Last Year: Never true    Arboriculturist in the Last Year: Never true  Transportation Needs: No Transportation Needs   Lack of Transportation (Medical): No   Lack of Transportation (Non-Medical): No  Physical Activity: Sufficiently Active   Days of Exercise per Week: 5 days   Minutes of Exercise per Session: 90 min  Stress: No Stress Concern Present   Feeling of Stress : Not at all  Social Connections: Moderately Integrated   Frequency  of Communication with Friends and Family: More than three times a week   Frequency of Social Gatherings with Friends and Family: Once a week   Attends Religious Services: 1 to 4 times per year   Active Member of [employer] or Organizations: No   Attends Archivist Meetings: Never   Marital Status: Married     Family History: The patient's family history includes Heart disease in an other family member.  ROS:   Please see the history of present illness.     All other systems reviewed and are negative.  EKGs/Labs/Other Studies Reviewed:    The following studies were reviewed today:    Recent Labs: 10/08/2021: BUN 15; Creatinine, Ser 0.62; Hemoglobin 13.6; Platelets 294; Potassium 3.8; Sodium 134  Recent Lipid Panel    Component Value Date/Time   CHOL 219 (H) 07/15/2020 0844   TRIG 64 07/15/2020 0844   HDL 82 07/15/2020 0844   CHOLHDL 2.7 07/15/2020 0844   VLDL 13.2 06/26/2019 0814   LDLCALC 121 (H) 07/15/2020 0844    Physical Exam:    Physical Exam: Blood pressure 136/82, pulse 67, height 5' 4.5" (1.638 m), weight 153 lb 6.4 oz (69.6 kg), SpO2 99 %.  GEN:  Well nourished, well developed in no acute distress HEENT: Normal NECK: No JVD; No carotid bruits LYMPHATICS: No lymphadenopathy CARDIAC: RRR , no murmurs, rubs, gallops RESPIRATORY:  Clear to auscultation without rales, wheezing or rhonchi  ABDOMEN: Soft, non-tender, non-distended MUSCULOSKELETAL:  No edema; No deformity  SKIN: Warm and dry NEUROLOGIC:  Alert and oriented x 3    EKG:       ASSESSMENT:    1. Essential hypertension   2. LBBB (left bundle branch block)      PLAN:        Hypertension: BP is well controlled.  Cont current meds.   2.  LBBB :   stable.  Her LV function is well preserved.  Cont coreg. HCTZ  Follow up with APP or me in 1 year   Medication Adjustments/Labs and Tests Ordered: Current medicines are reviewed at length with the patient today.  Concerns regarding medicines are outlined above.  No orders of the defined types were placed in this encounter.   No orders of the defined types were placed in this encounter.     Patient Instructions  Medication Instructions:  Your physician recommends that you continue on your current medications as directed. Please refer to the Current Medication list given to you today.  *If you need a refill on your cardiac medications before your next appointment, please call your pharmacy*   Lab Work: None ordered  If you have labs (blood work) drawn today and your tests are completely normal, you will receive your results only by: [place] (if you have MyChart) OR A paper copy in the mail If you have any lab test that is abnormal or we need to change your treatment, we will call you to review the results.   Testing/Procedures: None ordered   Follow-Up: At [facility], you and your health needs are our priority.  As part of our continuing mission to provide you with exceptional heart care, we have created designated Provider Care Teams.  These Care Teams include your primary Cardiologist (physician) and Advanced Practice Providers (APPs -  Physician Assistants and Nurse Practitioners) who all work together to provide you with the care you need, when you need it.  We recommend signing up for the patient portal called "MyChart".  Sign up information is provided on this After Visit Summary.  MyChart is used to connect with patients for Virtual Visits (Telemedicine).  Patients are able to  view lab/test results, encounter notes, upcoming appointments, etc.  Non-urgent messages can be sent to your provider as well.   To learn more about what you can do with MyChart, go to [email or web address].    Your next appointment:   12 month(s)  The format for your next appointment:   In Person  Provider:   Mertie Moores, MD  or Robbie Lis, PA-C, Christen Bame, NP, or Richardson Dopp, [place]         Other Instructions     Signed, Mertie Moores, MD  12/26/2021 10:31 AM    [place]

## 2021-12-26 ENCOUNTER — Other Ambulatory Visit: Payer: Self-pay

## 2021-12-26 ENCOUNTER — Encounter: Payer: Self-pay | Admitting: Cardiovascular Disease

## 2021-12-26 ENCOUNTER — Ambulatory Visit (INDEPENDENT_AMBULATORY_CARE_PROVIDER_SITE_OTHER): Payer: Medicare Other | Admitting: Cardiovascular Disease

## 2021-12-26 VITALS — BP 136/82 | HR 67 | Ht 64.5 in | Wt 153.4 lb

## 2021-12-26 DIAGNOSIS — I447 Left bundle-branch block, unspecified: Secondary | ICD-10-CM

## 2021-12-26 DIAGNOSIS — I10 Essential (primary) hypertension: Secondary | ICD-10-CM

## 2021-12-26 NOTE — Patient Instructions (Signed)
Medication Instructions:  Your physician recommends that you continue on your current medications as directed. Please refer to the Current Medication list given to you today.  *If you need a refill on your cardiac medications before your next appointment, please call your pharmacy*   Lab Work: None ordered  If you have labs (blood work) drawn today and your tests are completely normal, you will receive your results only by: [place] (if you have MyChart) OR A paper copy in the mail If you have any lab test that is abnormal or we need to change your treatment, we will call you to review the results.   Testing/Procedures: None ordered   Follow-Up: At [facility], you and your health needs are our priority.  As part of our continuing mission to provide you with exceptional heart care, we have created designated Provider Care Teams.  These Care Teams include your primary Cardiologist (physician) and Advanced Practice Providers (APPs -  Physician Assistants and Nurse Practitioners) who all work together to provide you with the care you need, when you need it.  We recommend signing up for the patient portal called "MyChart".  Sign up information is provided on this After Visit Summary.  MyChart is used to connect with patients for Virtual Visits (Telemedicine).  Patients are able to view lab/test results, encounter notes, upcoming appointments, etc.  Non-urgent messages can be sent to your provider as well.   To learn more about what you can do with MyChart, go to [email or web address].    Your next appointment:   12 month(s)  The format for your next appointment:   In Person  Provider:   Mertie Moores, MD  or Robbie Lis, PA-C, Christen Bame, NP, or Richardson Dopp, PA-C         Other Instructions

## 2022-02-19 ENCOUNTER — Encounter: Payer: Self-pay | Admitting: Family Medicine

## 2022-02-27 ENCOUNTER — Other Ambulatory Visit: Payer: Self-pay

## 2022-02-27 ENCOUNTER — Encounter: Payer: Self-pay | Admitting: Physician Assistant

## 2022-02-27 ENCOUNTER — Ambulatory Visit (INDEPENDENT_AMBULATORY_CARE_PROVIDER_SITE_OTHER): Payer: Medicare Other | Admitting: Physician Assistant

## 2022-02-27 DIAGNOSIS — Z86018 Personal history of other benign neoplasm: Secondary | ICD-10-CM

## 2022-02-27 DIAGNOSIS — D0439 Carcinoma in situ of skin of other parts of face: Secondary | ICD-10-CM | POA: Diagnosis not present

## 2022-02-27 DIAGNOSIS — Z85828 Personal history of other malignant neoplasm of skin: Secondary | ICD-10-CM | POA: Diagnosis not present

## 2022-02-27 DIAGNOSIS — Z1283 Encounter for screening for malignant neoplasm of skin: Secondary | ICD-10-CM | POA: Diagnosis not present

## 2022-02-27 DIAGNOSIS — Z85820 Personal history of malignant melanoma of skin: Secondary | ICD-10-CM | POA: Diagnosis not present

## 2022-02-27 DIAGNOSIS — D485 Neoplasm of uncertain behavior of skin: Secondary | ICD-10-CM

## 2022-02-27 DIAGNOSIS — L570 Actinic keratosis: Secondary | ICD-10-CM | POA: Diagnosis not present

## 2022-02-27 DIAGNOSIS — C44329 Squamous cell carcinoma of skin of other parts of face: Secondary | ICD-10-CM

## 2022-02-27 NOTE — Progress Notes (Signed)
? ?  Follow-Up Visit ?  ?Subjective  ?Elizabeth Armstrong is a 66 y.o. female who presents for the following: Annual Exam (Patient here today for yearly skin check, no new concerns. Personal history of atypical moles, melanoma and non mole skin cancer. Family history of  non mole skin cancer. ). ? ? ?The following portions of the chart were reviewed this encounter and updated as appropriate:  Tobacco  Allergies  Meds  Problems  Med Hx  Surg Hx  Fam Hx   ?  ? ?Objective  ?Well appearing patient in no apparent distress; mood and affect are within normal limits. ? ?A full examination was performed including scalp, head, eyes, ears, nose, lips, neck, chest, axillae, abdomen, back, buttocks, bilateral upper extremities, bilateral lower extremities, hands, feet, fingers, toes, fingernails, and toenails. All findings within normal limits unless otherwise noted below. ? ?head to toe ?No atypical nevi  ? ?lower left back ?White scar- clear ? ?Glabella ?Hyperkeratotic scale with pink base  ? ? ? ? ? ? ?Dorsum of Nose ?Hyperkeratotic scale with pink base  ? ? ? ? ? ? ?Left Eyebrow, Left Malar Cheek, Right Buccal Cheek ?Erythematous patches with gritty scale. ? ? ?Assessment & Plan  ?Encounter for screening for malignant neoplasm of skin ?head to toe ? ?Yearly skin examination ? ?Personal history of malignant melanoma of skin ?lower left back ? ?Yearly skin examination ? ?Neoplasm of uncertain behavior of skin (2) ?Glabella ? ?Skin / nail biopsy ?Type of biopsy: tangential   ?Informed consent: discussed and consent obtained   ?Timeout: patient name, date of birth, surgical site, and procedure verified   ?Anesthesia: the lesion was anesthetized in a standard fashion   ?Anesthetic:  1% lidocaine w/ epinephrine 1-100,000 local infiltration ?Instrument used: flexible razor blade   ?Hemostasis achieved with: aluminum chloride and electrodesiccation   ?Outcome: patient tolerated procedure well   ?Post-procedure details: wound  care instructions given   ? ?Specimen 1 - Surgical pathology ?Differential Diagnosis: bcc vs scc ? ?Check Margins: yes ? ?Dorsum of Nose ? ?Skin / nail biopsy ?Type of biopsy: tangential   ?Informed consent: discussed and consent obtained   ?Timeout: patient name, date of birth, surgical site, and procedure verified   ?Procedure prep:  Patient was prepped and draped in usual sterile fashion (Non sterile) ?Prep type:  Chlorhexidine ?Anesthesia: the lesion was anesthetized in a standard fashion   ?Anesthetic:  1% lidocaine w/ epinephrine 1-100,000 local infiltration ?Instrument used: flexible razor blade   ?Outcome: patient tolerated procedure well   ?Post-procedure details: wound care instructions given   ? ?Specimen 2 - Surgical pathology ?Differential Diagnosis: bcc vs scc ? ?Check Margins: yes ? ?AK (actinic keratosis) (3) ?Left Eyebrow; Left Malar Cheek; Right Buccal Cheek ? ?Destruction of lesion - Left Eyebrow, Left Malar Cheek, Right Buccal Cheek ?Complexity: simple   ?Destruction method: cryotherapy   ?Informed consent: discussed and consent obtained   ?Timeout:  patient name, date of birth, surgical site, and procedure verified ?Lesion destroyed using liquid nitrogen: Yes   ?Cryotherapy cycles:  3 ?Outcome: patient tolerated procedure well with no complications   ? ? ? ?I, Georgian Mcclory, PA-C, have reviewed all documentation's for this visit.  The documentation on 02/27/22 for the exam, diagnosis, procedures and orders are all accurate and complete. ?

## 2022-02-27 NOTE — Patient Instructions (Signed)

## 2022-03-05 ENCOUNTER — Telehealth: Payer: Self-pay

## 2022-03-05 NOTE — Telephone Encounter (Signed)
Path to patient and she is ok with the skin surgery center for the glabella and nose with [facility]  ?

## 2022-03-05 NOTE — Telephone Encounter (Signed)
-----   Message from Warren Danes, [place] sent at 03/01/2022  2:19 PM EDT ----- ?Glabella -- mohs ?Nose Edand C ?

## 2022-03-12 ENCOUNTER — Encounter: Payer: Self-pay | Admitting: Family Medicine

## 2022-03-30 ENCOUNTER — Encounter: Payer: Self-pay | Admitting: Family Medicine

## 2022-03-30 ENCOUNTER — Ambulatory Visit (INDEPENDENT_AMBULATORY_CARE_PROVIDER_SITE_OTHER): Payer: Medicare Other | Admitting: Family Medicine

## 2022-03-30 VITALS — BP 130/80 | HR 60 | Temp 98.0°F | Ht 64.5 in | Wt 154.1 lb

## 2022-03-30 DIAGNOSIS — E559 Vitamin D deficiency, unspecified: Secondary | ICD-10-CM

## 2022-03-30 DIAGNOSIS — Z23 Encounter for immunization: Secondary | ICD-10-CM

## 2022-03-30 DIAGNOSIS — Z13220 Encounter for screening for lipoid disorders: Secondary | ICD-10-CM | POA: Diagnosis not present

## 2022-03-30 DIAGNOSIS — I10 Essential (primary) hypertension: Secondary | ICD-10-CM

## 2022-03-30 DIAGNOSIS — E538 Deficiency of other specified B group vitamins: Secondary | ICD-10-CM

## 2022-03-30 DIAGNOSIS — M545 Low back pain, unspecified: Secondary | ICD-10-CM

## 2022-03-30 DIAGNOSIS — R5383 Other fatigue: Secondary | ICD-10-CM

## 2022-03-30 LAB — CBC WITH DIFFERENTIAL/PLATELET
Basophils Absolute: 0.1 10*3/uL (ref 0.0–0.1)
Basophils Relative: 1 % (ref 0.0–3.0)
Eosinophils Absolute: 0.1 10*3/uL (ref 0.0–0.7)
Eosinophils Relative: 2.5 % (ref 0.0–5.0)
HCT: 39.4 % (ref 36.0–46.0)
Hemoglobin: 13.4 g/dL (ref 12.0–15.0)
Lymphocytes Relative: 25.4 % (ref 12.0–46.0)
Lymphs Abs: 1.4 10*3/uL (ref 0.7–4.0)
MCHC: 34.1 g/dL (ref 30.0–36.0)
MCV: 99.8 fl (ref 78.0–100.0)
Monocytes Absolute: 0.6 10*3/uL (ref 0.1–1.0)
Monocytes Relative: 11.8 % (ref 3.0–12.0)
Neutro Abs: 3.2 10*3/uL (ref 1.4–7.7)
Neutrophils Relative %: 59.3 % (ref 43.0–77.0)
Platelets: 265 10*3/uL (ref 150.0–400.0)
RBC: 3.95 Mil/uL (ref 3.87–5.11)
RDW: 13.7 % (ref 11.5–15.5)
WBC: 5.4 10*3/uL (ref 4.0–10.5)

## 2022-03-30 LAB — COMPREHENSIVE METABOLIC PANEL
ALT: 11 U/L (ref 0–35)
AST: 17 U/L (ref 0–37)
Albumin: 4.5 g/dL (ref 3.5–5.2)
Alkaline Phosphatase: 58 U/L (ref 39–117)
BUN: 12 mg/dL (ref 6–23)
CO2: 30 mEq/L (ref 19–32)
Calcium: 9.8 mg/dL (ref 8.4–10.5)
Chloride: 96 mEq/L (ref 96–112)
Creatinine, Ser: 0.72 mg/dL (ref 0.40–1.20)
GFR: 86.21 mL/min (ref >60.00)
Glucose, Bld: 102 mg/dL — ABNORMAL HIGH (ref 70–99)
Potassium: 4.1 mEq/L (ref 3.5–5.1)
Sodium: 133 mEq/L — ABNORMAL LOW (ref 135–145)
Total Bilirubin: 0.7 mg/dL (ref 0.2–1.2)
Total Protein: 6.9 g/dL (ref 6.0–8.3)

## 2022-03-30 LAB — LIPID PANEL
Cholesterol: 232 mg/dL — ABNORMAL HIGH (ref 0–200)
HDL: 102.1 mg/dL (ref >39.00)
LDL Cholesterol: 116 mg/dL — ABNORMAL HIGH (ref 0–99)
NonHDL: 129.51
Total CHOL/HDL Ratio: 2
Triglycerides: 69 mg/dL (ref 0.0–149.0)
VLDL: 13.8 mg/dL (ref 0.0–40.0)

## 2022-03-30 LAB — FOLATE: Folate: 9.2 ng/mL (ref >5.9)

## 2022-03-30 LAB — VITAMIN D 25 HYDROXY (VIT D DEFICIENCY, FRACTURES): VITD: 64.45 ng/mL (ref 30.00–100.00)

## 2022-03-30 LAB — TSH: TSH: 1.22 u[IU]/mL (ref 0.35–5.50)

## 2022-03-30 LAB — VITAMIN B12: Vitamin B-12: 665 pg/mL (ref 211–911)

## 2022-03-30 MED ORDER — MELOXICAM 7.5 MG PO TABS: 7.5000 mg | ORAL_TABLET | Freq: Every day | ORAL | 1 refills | Status: DC | PRN

## 2022-03-30 NOTE — Patient Instructions (Signed)
*  watch blood pressure at home - if regularly 208+ systolic or 13+[id number] then let us know. Continue to monitor as you work on healthy eating and exercise.  ?

## 2022-03-30 NOTE — Progress Notes (Signed)
?Elizabeth Armstrong ?DOB: [birth year] ?Encounter date: 03/30/2022 ? ?This is a 66 y.o. female who presents with ?Chief Complaint  ?Patient presents with  ? Fatigue  ?  X2 months, states initially suspected the vitamin D dose of 2000 units daily was too much and she discontinued taking this  ? ? ?History of present illness: ?Last visit with me was 06/21/2020 and was a virtual visit.  She is preparing to complete a hip replacement at that time. Everything went well. No problems with this.  ? ?Hypertension: hasn't checked at home in awhile. Will check if not feeling well. Had issue in November, jan with inner ear situation - hit suddenly. Bp spiked with this episode - then started with tinnitus. Went to ED because she was worried about this.had eval including CT head - normal. completely resolved after week. Saw ENT yesterday and said could be crystal that got loose. Didn't recommend anything different. Hearing test was stable.  ? ?B12 deficiency: 1075mg daily. ? ?Osteoporosis: Has declined medication treatment in the past. She got off track with taking vitamin D and had bought increased dose when out visiting daughter in [place] so she stopped taking this and then felt better in a week or two. ? ?Sleep is hit or miss. Goes to bed at 9, but then up early and then will read or do something and can fall back asleep. Energy level feels fine now. Does cycling, golf, regular walking.  ? ?Colonoscopy was done this year; states was given 5 year repeat.  ? ? ?Allergies  ?Allergen Reactions  ? Adhesive [Tape] Rash  ? ?Current Meds  ?Medication Sig  ? carvedilol (COREG) 12.5 MG tablet TAKE 1 TABLET BY MOUTH 2 TIMES DAILY.  ? Cyanocobalamin (VITAMIN B-12 PO) Take by mouth.  ? hydrochlorothiazide (HYDRODIURIL) 25 MG tablet TAKE 1 TABLET BY MOUTH EVERY DAY  ? meloxicam (MOBIC) 7.5 MG tablet Take 1 tablet (7.5 mg total) by mouth daily as needed for pain.  ? potassium chloride (KLOR-CON) 10 MEQ tablet TAKE 1 TABLET BY MOUTH EVERY DAY   ? ? ?Review of Systems  ?Constitutional:  Negative for activity change, appetite change, chills, fatigue, fever and unexpected weight change.  ?HENT:  Negative for congestion, ear pain, hearing loss, sinus pressure, sinus pain, sore throat and trouble swallowing.   ?Eyes:  Negative for pain and visual disturbance.  ?Respiratory:  Negative for cough, chest tightness, shortness of breath and wheezing.   ?Cardiovascular:  Negative for chest pain, palpitations and leg swelling.  ?Gastrointestinal:  Negative for abdominal pain, blood in stool, constipation, diarrhea, nausea and vomiting.  ?Genitourinary:  Negative for difficulty urinating and menstrual problem.  ?Musculoskeletal:  Negative for arthralgias and back pain.  ?Skin:  Negative for rash.  ?Neurological:  Negative for dizziness, weakness, numbness and headaches.  ?Hematological:  Negative for adenopathy. Does not bruise/bleed easily.  ?Psychiatric/Behavioral:  Negative for sleep disturbance and suicidal ideas. The patient is not nervous/anxious.   ? ?Objective: ? ?BP 130/80 (BP Location: Left Arm, Patient Position: Sitting, Cuff Size: Normal)   Pulse 60   Temp 98 ?F (36.7 ?C) (Oral)   Ht 5' 4.5" (1.638 m)   Wt 154 lb 1.6 oz (69.9 kg)   SpO2 98%   BMI 26.04 kg/m?   Weight: 154 lb 1.6 oz (69.9 kg)  ? ?BP Readings from Last 3 Encounters:  ?03/30/22 130/80  ?12/26/21 136/82  ?10/08/21 (!) 158/74  ? ?Wt Readings from Last 3 Encounters:  ?03/30/22 154 lb 1.6 oz (  69.9 kg)  ?12/26/21 153 lb 6.4 oz (69.6 kg)  ?07/14/21 158 lb (71.7 kg)  ? ? ?Physical Exam ?Constitutional:   ?   General: She is not in acute distress. ?   Appearance: She is well-developed.  ?HENT:  ?   Head: Normocephalic and atraumatic.  ?   Right Ear: External ear normal.  ?   Left Ear: External ear normal.  ?   Mouth/Throat:  ?   Pharynx: No oropharyngeal exudate.  ?Eyes:  ?   Conjunctiva/sclera: Conjunctivae normal.  ?   Pupils: Pupils are equal, round, and reactive to light.  ?Neck:  ?    Thyroid: No thyromegaly.  ?Cardiovascular:  ?   Rate and Rhythm: Normal rate and regular rhythm.  ?   Heart sounds: Normal heart sounds. No murmur heard. ?  No friction rub. No gallop.  ?Pulmonary:  ?   Effort: Pulmonary effort is normal.  ?   Breath sounds: Normal breath sounds.  ?Abdominal:  ?   General: Bowel sounds are normal. There is no distension.  ?   Palpations: Abdomen is soft. There is no mass.  ?   Tenderness: There is no abdominal tenderness. There is no guarding.  ?   Hernia: No hernia is present.  ?Musculoskeletal:     ?   General: No tenderness or deformity. Normal range of motion.  ?   Cervical back: Normal range of motion and neck supple.  ?Lymphadenopathy:  ?   Cervical: No cervical adenopathy.  ?Skin: ?   General: Skin is warm and dry.  ?   Findings: No rash.  ?Neurological:  ?   Mental Status: She is alert and oriented to person, place, and time.  ?   Deep Tendon Reflexes: Reflexes normal.  ?   Reflex Scores: ?     Tricep reflexes are 2+ on the right side and 2+ on the left side. ?     Bicep reflexes are 2+ on the right side and 2+ on the left side. ?     Brachioradialis reflexes are 2+ on the right side and 2+ on the left side. ?     Patellar reflexes are 2+ on the right side and 2+ on the left side. ?Psychiatric:     ?   Speech: Speech normal.     ?   Behavior: Behavior normal.     ?   Thought Content: Thought content normal.  ? ? ?Assessment/Plan ? ?1. Fatigue, unspecified type ?This has improved with decreasing her vitamin D supplementation. ?- TSH; Future ? ?2. Essential hypertension ?Blood pressures been stable.  Continue Coreg 25 mg twice daily, hydrochlorothiazide 25 mg daily. ?- Comprehensive metabolic panel; Future ?- CBC with Differential/Platelet; Future ? ?3. B12 deficiency ?She is supplementing. ?- Vitamin B12; Future ?- Folate; Future ? ?4. Vitamin D deficiency ?Is felt better since cutting back on supplementation.  Levels have been normal. ?- VITAMIN D 25 Hydroxy (Vit-D  Deficiency, Fractures); Future ? ?5. Lipid screening ?- Lipid panel; Future ? ?6. Low back pain without sciatica, unspecified back pain laterality, unspecified chronicity ?Uses meloxicam on occasion to help maintain activity level.  She would like to get a refill on this today.  Advised taking with food and limiting use to as needed. ?- meloxicam (MOBIC) 7.5 MG tablet; Take 1 tablet (7.5 mg total) by mouth daily as needed for pain.  Dispense: 90 tablet; Refill: 1 ? ? ? ?Return in about 6 months (around 09/29/2022) for Chronic condition  visit. ? ? ? ? ?Micheline Rough, MD ?

## 2022-04-03 ENCOUNTER — Encounter: Payer: Self-pay | Admitting: Family Medicine

## 2022-04-05 ENCOUNTER — Encounter: Payer: Self-pay | Admitting: Family Medicine

## 2022-04-11 ENCOUNTER — Ambulatory Visit: Payer: Medicare Other | Admitting: Family Medicine

## 2022-05-16 ENCOUNTER — Ambulatory Visit (INDEPENDENT_AMBULATORY_CARE_PROVIDER_SITE_OTHER): Payer: Medicare Other

## 2022-05-16 VITALS — Ht 64.5 in | Wt 154.0 lb

## 2022-05-16 DIAGNOSIS — Z0000 Encounter for general adult medical examination without abnormal findings: Secondary | ICD-10-CM

## 2022-05-16 NOTE — Progress Notes (Signed)
Subjective:   Elizabeth Armstrong is a 66 y.o. female who presents for Medicare Annual (Subsequent) preventive examination.  Review of Systems    Virtual Visit via Telephone Note  I connected with  Elizabeth Armstrong on 05/16/22 at  1:30 PM EDT by telephone and verified that I am speaking with the correct person using two identifiers.  Location: Patient: Home Provider: Office Persons participating in the virtual visit: patient/Nurse Health Advisor   I discussed the limitations, risks, security and privacy concerns of performing an evaluation and management service by telephone and the availability of in person appointments. The patient expressed understanding and agreed to proceed.  Interactive audio and video telecommunications were attempted between this nurse and patient, however failed, due to patient having technical difficulties OR patient did not have access to video capability.  We continued and completed visit with audio only.  Some vital signs may be absent or patient reported.   Criselda Peaches, LPN  Cardiac Risk Factors include: advanced age (>39mn, >>37women);hypertension     Objective:    Today's Vitals   05/16/22 1343  Weight: 154 lb (69.9 kg)  Height: 5' 4.5" (1.638 m)   Body mass index is 26.03 kg/m.     05/16/2022    1:53 PM 10/08/2021    1:09 PM 12/26/2020    1:22 PM 06/14/2020    1:48 PM 06/03/2020   10:00 AM 06/19/2018    7:58 AM 06/16/2018   12:42 PM  Advanced Directives  Does Patient Have a Medical Advance Directive? No No Yes Yes Yes No No  Type of AScientist, physiologicalof ABuckhallLiving will Living will;Healthcare Power of [place]ick[place]Living will    Does patient want to make changes to medical advance directive?   No - Patient declined No - Patient declined     Copy of HWatsonin Chart?   No - copy requested No - copy requested     Would patient like information on creating a medical  advance directive? No - Patient declined No - Patient declined    Yes (MAU/Ambulatory/Procedural Areas - Information given) Yes (MAU/Ambulatory/Procedural Areas - Information given)    Current Medications (verified) Outpatient Encounter Medications as of 05/16/2022  Medication Sig   carvedilol (COREG) 12.5 MG tablet TAKE 1 TABLET BY MOUTH 2 TIMES DAILY.   Cyanocobalamin (VITAMIN B-12 PO) Take by mouth.   hydrochlorothiazide (HYDRODIURIL) 25 MG tablet TAKE 1 TABLET BY MOUTH EVERY DAY   meloxicam (MOBIC) 7.5 MG tablet Take 1 tablet (7.5 mg total) by mouth daily as needed for pain.   potassium chloride (KLOR-CON) 10 MEQ tablet TAKE 1 TABLET BY MOUTH EVERY DAY   No facility-administered encounter medications on file as of 05/16/2022.    Allergies (verified) Adhesive [tape]   History: Past Medical History:  Diagnosis Date   Atypical mole 04/11/2007   SEVERE LEFT MID BACK- WIDER SHAVE   Atypical mole 01/15/2014   MODERATE-LEFT THIGH UPPER- NO TREATMENT   Atypical mole 08/03/2015   MODERATE-LEFT LOWER BACK- NO TREATMENT   Atypical mole 08/03/2015   SEVERE-LEFT UPPER PARASPINAL- WIDER SHAVE   Atypical mole 08/03/2015   MILD-RIGHT INNER KNEE-NO TREATMENT   Atypical mole 11/15/2015   SEVERE-RIGHT POST THIGH-WIDER SHAVE   Atypical mole 12/07/2016   MILD-LEFT LOW BACK- NO TREATMENT   B12 deficiency 04/17/2019   Basal cell carcinoma 01/15/2014   nodular chest-cx3,5FU   BCC (basal cell carcinoma of skin) 03/07/2017  NODULAR CHEST-TX AFTER BIOPSY   Essential hypertension 11/19/2014   Heart murmur    HTN (hypertension)    Hypercholesteremia    Melanoma in situ ([place]) 01/15/2014   LOWER LEFT BACK- MOHS DR. Manley Mason   Melanoma of skin ([place]) 08/25/2015   sees dermatologist at least twice per year, Dr. Denna Haggard   Osteoarthritis 11/19/2014   Osteoarthrosis, unspecified whether generalized or localized, involving lower leg 11/19/2014   of knees, s/p knee replacements bilat, sees [facility] orth[facility]    Osteopenia 01/04/2017   Palpitations 08/25/2015   Palpitations    Pneumonia    Pure hypercholesterolemia 11/19/2014   SCCA (squamous cell carcinoma) of skin 11/13/2017   MID CHEST-C X 3, 5FU   Vaginal atrophy 08/25/2015   Past Surgical History:  Procedure Laterality Date   APPENDECTOMY  2009   CESAREAN SECTION     JOINT REPLACEMENT Bilateral    knees   KNEE SURGERY  2009   left   OPEN REDUCTION INTERNAL FIXATION (ORIF) METACARPAL Left 06/19/2018   Procedure: LEFT SMALL FINGER OPEN REDUCTION INTERNAL FIXATION (ORIF) CARPOMETACARPAL FUSION;  Surgeon: Leanora Cover, MD;  Location: [place];  Service: Orthopedics;  Laterality: Left;   TOTAL HIP ARTHROPLASTY Right 06/14/2020   Procedure: TOTAL HIP ARTHROPLASTY ANTERIOR APPROACH;  Surgeon: Paralee Cancel, MD;  Location: [facility];  Service: Orthopedics;  Laterality: Right;  70 mins   WRIST SURGERY  2007   right   Family History  Problem Relation Age of Onset   Heart disease Other    Social History   Socioeconomic History   Marital status: Married    Spouse name: Not on file   Number of children: 1   Years of education: Not on file   Highest education level: 12th grade  Occupational History   Occupation: Tree surgeon  Tobacco Use   Smoking status: Never   Smokeless tobacco: Never  Vaping Use   Vaping Use: Never used  Substance and Sexual Activity   Alcohol use: Yes    Alcohol/week: 3.0 standard drinks of alcohol    Types: 3 Glasses of wine per week    Comment: social   Drug use: Never   Sexual activity: Not Currently  Other Topics Concern   Not on file  Social History Narrative   Work or School: Nurse, adult Situation: lives with husband      Spiritual Beliefs: catholic      Lifestyle: very active with cycling and tennis; healthy diet      Social Determinants of Health   Financial Resource Strain: Low Risk  (05/16/2022)   Overall Financial Resource Strain (CARDIA)    Difficulty of Paying Living  Expenses: Not hard at all  Food Insecurity: No Food Insecurity (05/16/2022)   Hunger Vital Sign    Worried About Running Out of Food in the Last Year: Never true    [place] in the Last Year: Never true  Transportation Needs: No Transportation Needs (05/16/2022)   PRAPARE - Hydrologist (Medical): No    Lack of Transportation (Non-Medical): No  Physical Activity: Sufficiently Active (05/16/2022)   Exercise Vital Sign    Days of Exercise per Week: 7 days    Minutes of Exercise per Session: 60 min  Stress: No Stress Concern Present (05/16/2022)   [place]    Feeling of Stress : Not at all  Social Connections: [place] (  05/16/2022)   Social Connection and Isolation Panel [NHANES]    Frequency of Communication with Friends and Family: More than three times a week    Frequency of Social Gatherings with Friends and Family: More than three times a week    Attends Religious Services: More than 4 times per year    Active Member of [employer] or Organizations: No    Attends Music therapist: More than 4 times per year    Marital Status: Married      Clinical Intake:  Pre-visit preparation completed: No  Pain : No/denies pain     BMI - recorded: 26.05 Nutritional Status: BMI 25 -29 Overweight Nutritional Risks: None Diabetes: No  How often do you need to have someone help you when you read instructions, pamphlets, or other written materials from your doctor or pharmacy?: 1 - Never  Diabetic?  No  Interpreter Needed?: No  Activities of Daily Living    05/16/2022    1:50 PM  In your present state of health, do you have any difficulty performing the following activities:  Hearing? 0  Vision? 0  Difficulty concentrating or making decisions? 0  Walking or climbing stairs? 0  Dressing or bathing? 0  Doing errands, shopping? 0  Preparing Food and eating ? N   Using the Toilet? N  In the past six months, have you accidently leaked urine? N  Do you have problems with loss of bowel control? N  Managing your Medications? N  Managing your Finances? N    Patient Care Team: Caren Macadam, MD (Inactive) as PCP - General (Family Medicine) Nahser, Wonda Cheng, MD as PCP - Cardiology (Cardiology) Warren Danes, PA-C as Physician Assistant (Dermatology)  Indicate any recent Medical Services you may have received from other than [facility] providers in the past year (date may be approximate).     Assessment:   This is a routine wellness examination for [place].  Hearing/Vision screen Hearing Screening - Comments:: No hearing difficulty Vision Screening - Comments:: Wears glasses. Followed by [facility] issues and exercise activities discussed: Exercise limited by: None identified   Goals Addressed               This Visit's Progress     Maintain current activity (pt-stated)        I will continue to go to the gym 5 days a week.       Depression Screen    05/16/2022    1:48 PM 03/30/2022   10:30 AM 12/26/2020    1:24 PM 07/03/2019    8:56 AM 03/11/2018    2:54 PM 03/21/2017    7:31 AM  PHQ 2/9 Scores  PHQ - 2 Score 0 0 0 0 0 0  PHQ- 9 Score 0 2        Fall Risk    05/16/2022    1:51 PM 03/30/2022   10:34 AM 03/29/2022    8:42 AM 12/26/2020    1:23 PM  Fall Risk   Falls in the past year? 0 0 0 0  Number falls in past yr: 0 0  0  Injury with Fall? 0 0  0  Risk for fall due to : No Fall Risks No Fall Risks  No Fall Risks  Follow up  Falls evaluation completed  Falls evaluation completed;Falls prevention discussed    FALL RISK PREVENTION PERTAINING TO THE HOME:  Any stairs in or around the home? Yes  If so,  are there any without handrails? No  Home free of loose throw rugs in walkways, pet beds, electrical cords, etc? Yes  Adequate lighting in your home to reduce risk of falls? Yes   ASSISTIVE DEVICES  UTILIZED TO PREVENT FALLS:  Life alert? No  Use of a cane, walker or w/c? No  Grab bars in the bathroom? No  Shower chair or bench in shower? No  Elevated toilet seat or a handicapped toilet? No   TIMED UP AND GO:  Was the test performed? No . Audio Visit  Cognitive Function:        05/16/2022    1:53 PM  6CIT Screen  What Year? 0 points  What month? 0 points  What time? 0 points  Count back from 20 0 points  Months in reverse 0 points  Repeat phrase 0 points  Total Score 0 points    Immunizations Immunization History  Administered Date(s) Administered   Influenza Split 09/07/2013   Influenza, High Dose Seasonal PF 09/12/2021   Influenza, Seasonal, Injecte, Preservative Fre 09/15/2014   Influenza,inj,Quad PF,6+ Mos 09/05/2016, 09/16/2017, 09/26/2018   Influenza-Unspecified 07/12/2019, 08/29/2020   PFIZER Comirnaty(Gray Top)Covid-19 Tri-Sucrose Vaccine 03/06/2021   PFIZER(Purple Top)SARS-COV-2 Vaccination 01/09/2020, 02/03/2020, 08/29/2020   PNEUMOCOCCAL CONJUGATE-20 03/30/2022   Pfizer Covid-19 Vaccine Bivalent Booster 8yr & up 09/12/2021   Pneumococcal Conjugate-13 07/03/2019   Pneumococcal Polysaccharide-23 09/16/2020   Tdap 10/25/2012, 04/12/2018   Zoster Recombinat (Shingrix) 11/22/2019, 02/25/2020   Zoster, Live 02/27/2016    TDAP status: Up to date  Flu Vaccine status: Up to date  Pneumococcal vaccine status: Up to date  Covid-19 vaccine status: Completed vaccines  Qualifies for Shingles Vaccine? Yes   Zostavax completed Yes   Shingrix Completed?: Yes  Screening Tests Health Maintenance  Topic Date Due   INFLUENZA VACCINE  07/03/2022   MAMMOGRAM  06/01/2023   COLONOSCOPY (Pts [id number] coverage will need to be confirmed)  08/15/2026   TETANUS/TDAP  04/12/2028   Pneumonia Vaccine 68Years old  Completed   DEXA SCAN  Completed   COVID-19 Vaccine  Completed   Hepatitis C Screening  Completed   Zoster Vaccines- Shingrix  Completed    HPV VACCINES  Aged Out    Health Maintenance  There are no preventive care reminders to display for this patient.  Colorectal cancer screening: Type of screening: Colonoscopy. Completed 04/14/21. Repeat every 10 years  Mammogram status: Completed 05/31/21. Repeat every year  Bone Density status: Completed 05/09/20. Results reflect: Bone density results: OSTEOPOROSIS. Repeat every 2 years.  Lung Cancer Screening: (Low Dose CT Chest recommended if Age 68-80ears, 30 pack-year currently smoking OR have quit w/in 15years.) does not qualify.     Additional Screening:  Hepatitis C Screening: does qualify; Completed 08/25/15  Vision Screening: Recommended annual ophthalmology exams for early detection of glaucoma and other disorders of the eye. Is the patient up to date with their annual eye exam?  Yes  Who is the provider or what is the name of the office in which the patient attends annual eye exams? [facility] pt is not established with a provider, would they like to be referred to a provider to establish care? No .   Dental Screening: Recommended annual dental exams for proper oral hygiene  Community Resource Referral / Chronic Care Management:   CRR required this visit?  No   CCM required this visit?  No      Plan:     I have personally  reviewed and noted the following in the patient's chart:   Medical and social history Use of alcohol, tobacco or illicit drugs  Current medications and supplements including opioid prescriptions.  Functional ability and status Nutritional status Physical activity Advanced directives List of other physicians Hospitalizations, surgeries, and ER visits in previous 12 months Vitals Screenings to include cognitive, depression, and falls Referrals and appointments  In addition, I have reviewed and discussed with patient certain preventive protocols, quality metrics, and best practice recommendations. A written personalized care plan  for preventive services as well as general preventive health recommendations were provided to patient.     Criselda Peaches, LPN   [id number]   Nurse Notes: None

## 2022-05-16 NOTE — Patient Instructions (Addendum)
Elizabeth Armstrong , Thank you for taking time to come for your Medicare Wellness Visit. I appreciate your ongoing commitment to your health goals. Please review the following plan we discussed and let me know if I can assist you in the future.   These are the goals we discussed:  Goals       Maintain current activity (pt-stated)      I will continue to go to the gym 5 days a week.        This is a list of the screening recommended for you and due dates:  Health Maintenance  Topic Date Due   Flu Shot  07/03/2022   Mammogram  06/01/2023   Colon Cancer Screening  08/15/2026   Tetanus Vaccine  04/12/2028   Pneumonia Vaccine  Completed   DEXA scan (bone density measurement)  Completed   COVID-19 Vaccine  Completed   Hepatitis C Screening: USPSTF Recommendation to screen - Ages 18-79 yo.  Completed   Zoster (Shingles) Vaccine  Completed   HPV Vaccine  Aged Out   Advanced directives: No  Conditions/risks identified: None  Next appointment: Follow up in one year for your annual wellness visit    Preventive Care 65 Years and Older, Female Preventive care refers to lifestyle choices and visits with your health care provider that can promote health and wellness. What does preventive care include? A yearly physical exam. This is also called an annual well check. Dental exams once or twice a year. Routine eye exams. Ask your health care provider how often you should have your eyes checked. Personal lifestyle choices, including: Daily care of your teeth and gums. Regular physical activity. Eating a healthy diet. Avoiding tobacco and drug use. Limiting alcohol use. Practicing safe sex. Taking low-dose aspirin every day. Taking vitamin and mineral supplements as recommended by your health care provider. What happens during an annual well check? The services and screenings done by your health care provider during your annual well check will depend on your age, overall health, lifestyle  risk factors, and family history of disease. Counseling  Your health care provider may ask you questions about your: Alcohol use. Tobacco use. Drug use. Emotional well-being. Home and relationship well-being. Sexual activity. Eating habits. History of falls. Memory and ability to understand (cognition). Work and work Statistician. Reproductive health. Screening  You may have the following tests or measurements: Height, weight, and BMI. Blood pressure. Lipid and cholesterol levels. These may be checked every 5 years, or more frequently if you are over 11 years old. Skin check. Lung cancer screening. You may have this screening every year starting at age 56 if you have a 30-pack-year history of smoking and currently smoke or have quit within the past 15 years. Fecal occult blood test (FOBT) of the stool. You may have this test every year starting at age 26. Flexible sigmoidoscopy or colonoscopy. You may have a sigmoidoscopy every 5 years or a colonoscopy every 10 years starting at age 14. Hepatitis C blood test. Hepatitis B blood test. Sexually transmitted disease (STD) testing. Diabetes screening. This is done by checking your blood sugar (glucose) after you have not eaten for a while (fasting). You may have this done every 1-3 years. Bone density scan. This is done to screen for osteoporosis. You may have this done starting at age 29. Mammogram. This may be done every 1-2 years. Talk to your health care provider about how often you should have regular mammograms. Talk with your health care provider  about your test results, treatment options, and if necessary, the need for more tests. Vaccines  Your health care provider may recommend certain vaccines, such as: Influenza vaccine. This is recommended every year. Tetanus, diphtheria, and acellular pertussis (Tdap, Td) vaccine. You may need a Td booster every 10 years. Zoster vaccine. You may need this after age 26. Pneumococcal  13-valent conjugate (PCV13) vaccine. One dose is recommended after age 32. Pneumococcal polysaccharide (PPSV23) vaccine. One dose is recommended after age 24. Talk to your health care provider about which screenings and vaccines you need and how often you need them. This information is not intended to replace advice given to you by your health care provider. Make sure you discuss any questions you have with your health care provider. Document Released: 12/16/2015 Document Revised: 08/08/2016 Document Reviewed: 09/20/2015 Elsevier Interactive Patient Education  2017 [place] Prevention in the Home Falls can cause injuries. They can happen to people of all ages. There are many things you can do to make your home safe and to help prevent falls. What can I do on the outside of my home? Regularly fix the edges of walkways and driveways and fix any cracks. Remove anything that might make you trip as you walk through a door, such as a raised step or threshold. Trim any bushes or trees on the path to your home. Use bright outdoor lighting. Clear any walking paths of anything that might make someone trip, such as rocks or tools. Regularly check to see if handrails are loose or broken. Make sure that both sides of any steps have handrails. Any raised decks and porches should have guardrails on the edges. Have any leaves, snow, or ice cleared regularly. Use sand or salt on walking paths during winter. Clean up any spills in your garage right away. This includes oil or grease spills. What can I do in the bathroom? Use night lights. Install grab bars by the toilet and in the tub and shower. Do not use towel bars as grab bars. Use non-skid mats or decals in the tub or shower. If you need to sit down in the shower, use a plastic, non-slip stool. Keep the floor dry. Clean up any water that spills on the floor as soon as it happens. Remove soap buildup in the tub or shower regularly. Attach  bath mats securely with double-sided non-slip rug tape. Do not have throw rugs and other things on the floor that can make you trip. What can I do in the bedroom? Use night lights. Make sure that you have a light by your bed that is easy to reach. Do not use any sheets or blankets that are too big for your bed. They should not hang down onto the floor. Have a firm chair that has side arms. You can use this for support while you get dressed. Do not have throw rugs and other things on the floor that can make you trip. What can I do in the kitchen? Clean up any spills right away. Avoid walking on wet floors. Keep items that you use a lot in easy-to-reach places. If you need to reach something above you, use a strong step stool that has a grab bar. Keep electrical cords out of the way. Do not use floor polish or wax that makes floors slippery. If you must use wax, use non-skid floor wax. Do not have throw rugs and other things on the floor that can make you trip. What can I do  with my stairs? Do not leave any items on the stairs. Make sure that there are handrails on both sides of the stairs and use them. Fix handrails that are broken or loose. Make sure that handrails are as long as the stairways. Check any carpeting to make sure that it is firmly attached to the stairs. Fix any carpet that is loose or worn. Avoid having throw rugs at the top or bottom of the stairs. If you do have throw rugs, attach them to the floor with carpet tape. Make sure that you have a light switch at the top of the stairs and the bottom of the stairs. If you do not have them, ask someone to add them for you. What else can I do to help prevent falls? Wear shoes that: Do not have high heels. Have rubber bottoms. Are comfortable and fit you well. Are closed at the toe. Do not wear sandals. If you use a stepladder: Make sure that it is fully opened. Do not climb a closed stepladder. Make sure that both sides of the  stepladder are locked into place. Ask someone to hold it for you, if possible. Clearly mark and make sure that you can see: Any grab bars or handrails. First and last steps. Where the edge of each step is. Use tools that help you move around (mobility aids) if they are needed. These include: Canes. Walkers. Scooters. Crutches. Turn on the lights when you go into a dark area. Replace any light bulbs as soon as they burn out. Set up your furniture so you have a clear path. Avoid moving your furniture around. If any of your floors are uneven, fix them. If there are any pets around you, be aware of where they are. Review your medicines with your doctor. Some medicines can make you feel dizzy. This can increase your chance of falling. Ask your doctor what other things that you can do to help prevent falls. This information is not intended to replace advice given to you by your health care provider. Make sure you discuss any questions you have with your health care provider. Document Released: 09/15/2009 Document Revised: 04/26/2016 Document Reviewed: 12/24/2014 Elsevier Interactive Patient Education  2017 [employer].

## 2022-06-27 ENCOUNTER — Encounter: Payer: Self-pay | Admitting: Family Medicine

## 2022-07-04 ENCOUNTER — Encounter: Payer: Self-pay | Admitting: Physician Assistant

## 2022-07-06 ENCOUNTER — Ambulatory Visit: Payer: Medicare Other | Admitting: Cardiovascular Disease

## 2022-07-12 ENCOUNTER — Other Ambulatory Visit: Payer: Self-pay | Admitting: Cardiovascular Disease

## 2022-07-19 NOTE — Telephone Encounter (Signed)
Patient called and she stated 5FU was given by another provider to treat bid on the nose (CIS/AK). She was not comfortable with this so I further explained the 5Fu process that [facility] does nightly x 2 weeks and ok to take breaks if it gets painful. After this treatment iF the lesion is still red raised active we may need to still edc like Kelli originally recommended. Patient is now reassured and comfortable with the treatment.

## 2022-09-09 ENCOUNTER — Encounter: Payer: Self-pay | Admitting: Cardiovascular Disease

## 2022-09-09 NOTE — Progress Notes (Unsigned)
Cardiology Office Note:    Date:  09/10/2022   ID:  TYRONE BALASH, DOB Sep 16, 1956, MRN [id number]  PCP:  Caren Macadam, MD (Inactive)  Cardiologist:  Karaline Buresh   Electrophysiologist:  None   Referring MD: No ref. provider found   Chief Complaint  Patient presents with   Hypertension        Shortness of Breath         History of Present Illness:    Elizabeth Armstrong is a 66 y.o. female with a hx of HTN is been having some palpitations for the past several weeks.  HR was elevated at baseline.   Felt flushed.  Occurred at work  BP was elevated several weeks ago  Noticed that her HR was "racing" for a few seconds.   Would last for few seconds .plays golf several tiems a week Light cardio and weights several times a week. Rides stationary bike   Has tried Bystolic ( from Dr. Doreatha Lew) .  Insurance stopped paying for it and she changed to amlodipine a year ago Has gained some weight .   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  Dec, 14,  2020   Elizabeth Armstrong is seen today for follow up of her HTN. BP has been a bit elevated Is exercising some  On Coreg 12. 5 BID,  Is off amlodipine  Takes Mobic 7.5 mg about every day  We will add HCTZ 25 mg a day and Kdur 10 meq a day    Jan. 4, 2022: Elizabeth Armstrong is seen for follow up of her HTN No cp, no dyspnea Has been visiting family for the past several months  Exercising well Playing lots of golf  Hip replacement in mid July   Aug. 3, 2022 Elizabeth Armstrong is seen today for follow up of her HTN She had poor R wave progression at her last EKG.  We performed an echocardiogram to evaluate her for the possibility of silent anterior wall myocardial infarction. Echocardiogram from December 08, 2020 reveals normal left ventricular systolic function.  She has grade 1 diastolic  dysfunction.  Trivial mitral regurgitation.  Jan. 24, 2023: Elizabeth Armstrong is seen today for follow up of her HTN and LBBB Lexiscan myoview showed no ischemia and no infarction Is having some dizziness, Also has tinnitis - worsened recently  Woke up dizzy one morning, worsened Unable to walk without assistance  Is able to exercise now Has been in [place] for 2 months  - visiting daughter .     Oct. 9, 2023  Elizabeth Armstrong is seen for follow up HTN and her LBBB  Lexiscan myoview showed no ischemia and no infarction Hiking , is very active No CP          Past Medical History:  Diagnosis Date   Atypical mole 04/11/2007   SEVERE LEFT MID BACK- WIDER SHAVE   Atypical mole 01/15/2014   MODERATE-LEFT THIGH UPPER- NO TREATMENT   Atypical mole 08/03/2015   MODERATE-LEFT LOWER BACK- NO TREATMENT   Atypical mole 08/03/2015   SEVERE-LEFT UPPER PARASPINAL- WIDER SHAVE   Atypical mole 08/03/2015   MILD-RIGHT INNER KNEE-NO TREATMENT   Atypical mole 11/15/2015   SEVERE-RIGHT POST THIGH-WIDER SHAVE   Atypical mole 12/07/2016   MILD-LEFT LOW BACK- NO TREATMENT   B12 deficiency 04/17/2019   Basal cell carcinoma  01/15/2014   nodular chest-cx3,5FU   BCC (basal cell carcinoma of skin) 03/07/2017   NODULAR CHEST-TX AFTER BIOPSY   Essential hypertension 11/19/2014   Heart murmur    HTN (hypertension)    Hypercholesteremia    Melanoma in situ ([place]) 01/15/2014   LOWER LEFT BACK- MOHS DR. Manley Mason   Melanoma of skin ([place]) 08/25/2015   sees dermatologist at least twice per year, Dr. Denna Haggard   Osteoarthritis 11/19/2014   Osteoarthrosis, unspecified whether generalized or localized, involving lower leg 11/19/2014   of knees, s/p knee replacements bilat, sees [facility] ortho   Osteopenia 01/04/2017   Palpitations 08/25/2015   Palpitations    Pneumonia    Pure hypercholesterolemia 11/19/2014   SCCA (squamous cell carcinoma) of skin 11/13/2017   MID CHEST-C X 3, 5FU   Vaginal atrophy 08/25/2015     Past Surgical History:  Procedure Laterality Date   APPENDECTOMY  2009   CESAREAN SECTION     JOINT REPLACEMENT Bilateral    knees   KNEE SURGERY  2009   left   OPEN REDUCTION INTERNAL FIXATION (ORIF) METACARPAL Left 06/19/2018   Procedure: LEFT SMALL FINGER OPEN REDUCTION INTERNAL FIXATION (ORIF) CARPOMETACARPAL FUSION;  Surgeon: Leanora Cover, MD;  Location: [facility];  Service: Orthopedics;  Laterality: Left;   TOTAL HIP ARTHROPLASTY Right 06/14/2020   Procedure: TOTAL HIP ARTHROPLASTY ANTERIOR APPROACH;  Surgeon: Paralee Cancel, MD;  Location: [facility];  Service: Orthopedics;  Laterality: Right;  70 mins   WRIST SURGERY  2007   right    Current Medications: Current Meds  Medication Sig   carvedilol (COREG) 12.5 MG tablet TAKE 1 TABLET BY MOUTH TWICE A DAY   Cyanocobalamin (VITAMIN B-12 PO) Take by mouth.   hydrochlorothiazide (HYDRODIURIL) 25 MG tablet TAKE 1 TABLET BY MOUTH EVERY DAY   meloxicam (MOBIC) 7.5 MG tablet Take 1 tablet (7.5 mg total) by mouth daily as needed for pain.   potassium chloride (KLOR-CON) 10 MEQ tablet TAKE 1 TABLET BY MOUTH EVERY DAY     Allergies:   Adhesive [tape]   Social History   Socioeconomic History   Marital status: Married    Spouse name: Not on file   Number of children: 1   Years of education: Not on file   Highest education level: 12th grade  Occupational History   Occupation: Tree surgeon  Tobacco Use   Smoking status: Never   Smokeless tobacco: Never  Vaping Use   Vaping Use: Never used  Substance and Sexual Activity   Alcohol use: Yes    Alcohol/week: 3.0 standard drinks of alcohol    Types: 3 Glasses of wine per week    Comment: social   Drug use: Never   Sexual activity: Not Currently  Other Topics Concern   Not on file  Social History Narrative   Work or School: Nurse, adult Situation: lives with husband      Spiritual Beliefs: catholic      Lifestyle: very active with cycling and tennis;  healthy diet      Social Determinants of Health   Financial Resource Strain: Low Risk  (05/16/2022)   Overall Financial Resource Strain (CARDIA)    Difficulty of Paying Living Expenses: Not hard at all  Food Insecurity: No Food Insecurity (05/16/2022)   Hunger Vital Sign    Worried About Running Out of Food in the Last Year: Never true    [place] in the Last Year: Never  true  Transportation Needs: No Transportation Needs (05/16/2022)   PRAPARE - Hydrologist (Medical): No    Lack of Transportation (Non-Medical): No  Physical Activity: Sufficiently Active (05/16/2022)   Exercise Vital Sign    Days of Exercise per Week: 7 days    Minutes of Exercise per Session: 60 min  Stress: No Stress Concern Present (05/16/2022)   [place]    Feeling of Stress : Not at all  Social Connections: [place] (05/16/2022)   Social Connection and Isolation Panel [NHANES]    Frequency of Communication with Friends and Family: More than three times a week    Frequency of Social Gatherings with Friends and Family: More than three times a week    Attends Religious Services: More than 4 times per year    Active Member of [employer] or Organizations: No    Attends Music therapist: More than 4 times per year    Marital Status: Married     Family History: The patient's family history includes Heart disease in an other family member.  ROS:   Please see the history of present illness.     All other systems reviewed and are negative.  EKGs/Labs/Other Studies Reviewed:    The following studies were reviewed today:    Recent Labs: 03/30/2022: ALT 11; BUN 12; Creatinine, Ser 0.72; Hemoglobin 13.4; Platelets 265.0; Potassium 4.1; Sodium 133; TSH 1.22  Recent Lipid Panel    Component Value Date/Time   CHOL 232 (H) 03/30/2022 1143   TRIG 69.0 03/30/2022 1143   HDL 102.10 03/30/2022 1143    CHOLHDL 2 03/30/2022 1143   VLDL 13.8 03/30/2022 1143   LDLCALC 116 (H) 03/30/2022 1143   LDLCALC 121 (H) 07/15/2020 0844    Physical Exam:    Physical Exam: Blood pressure 128/72, pulse (!) 58, height 5' 4.5" (1.638 m), weight 155 lb (70.3 kg), SpO2 99 %.       GEN:  Well nourished, well developed in no acute distress HEENT: Normal NECK: No JVD; No carotid bruits LYMPHATICS: No lymphadenopathy CARDIAC: RRR , no murmurs, rubs, gallops RESPIRATORY:  Clear to auscultation without rales, wheezing or rhonchi  ABDOMEN: Soft, non-tender, non-distended MUSCULOSKELETAL:  No edema; No deformity  SKIN: Warm and dry NEUROLOGIC:  Alert and oriented x 3     EKG:   Sinus bradycardia at 58.  Poor R wave progression-?  Lead placement    ASSESSMENT:    1. Essential hypertension   2. LBBB (left bundle branch block)       PLAN:        Hypertension:  Bp is well controlled.  Continue current meds.   2.  LBBB :   normal LV function by echo last year.  Will see her in a year.     Medication Adjustments/Labs and Tests Ordered: Current medicines are reviewed at length with the patient today.  Concerns regarding medicines are outlined above.  Orders Placed This Encounter  Procedures   EKG 12-Lead    No orders of the defined types were placed in this encounter.     Patient Instructions  Medication Instructions:  NO CHANGES *If you need a refill on your cardiac medications before your next appointment, please call your pharmacy*   Lab Work: NONE If you have labs (blood work) drawn today and your tests are completely normal, you will receive your results only by: [place] (if you have MyChart)  OR A paper copy in the mail If you have any lab test that is abnormal or we need to change your treatment, we will call you to review the results.   Testing/Procedures: NONE   Follow-Up: At [facility], you and your health needs are our priority.  As  part of our continuing mission to provide you with exceptional heart care, we have created designated Provider Care Teams.  These Care Teams include your primary Cardiologist (physician) and Advanced Practice Providers (APPs -  Physician Assistants and Nurse Practitioners) who all work together to provide you with the care you need, when you need it.  We recommend signing up for the patient portal called "MyChart".  Sign up information is provided on this After Visit Summary.  MyChart is used to connect with patients for Virtual Visits (Telemedicine).  Patients are able to view lab/test results, encounter notes, upcoming appointments, etc.  Non-urgent messages can be sent to your provider as well.   To learn more about what you can do with MyChart, go to [email or web address].    Your next appointment:   1 year(s)  The format for your next appointment:   In Person  Provider:   Mertie Moores, MD     Other Instructions NONE  Important Information About Sugar         Signed, Mertie Moores, MD  09/10/2022 7:28 PM    [place]

## 2022-09-10 ENCOUNTER — Ambulatory Visit: Payer: Medicare Other | Attending: Cardiovascular Disease | Admitting: Cardiovascular Disease

## 2022-09-10 ENCOUNTER — Encounter: Payer: Self-pay | Admitting: Cardiovascular Disease

## 2022-09-10 VITALS — BP 128/72 | HR 58 | Ht 64.5 in | Wt 155.0 lb

## 2022-09-10 DIAGNOSIS — I447 Left bundle-branch block, unspecified: Secondary | ICD-10-CM | POA: Diagnosis present

## 2022-09-10 DIAGNOSIS — I10 Essential (primary) hypertension: Secondary | ICD-10-CM | POA: Insufficient documentation

## 2022-09-10 NOTE — Patient Instructions (Signed)
Medication Instructions:  NO CHANGES *If you need a refill on your cardiac medications before your next appointment, please call your pharmacy*   Lab Work: NONE If you have labs (blood work) drawn today and your tests are completely normal, you will receive your results only by: MyChart Message (if you have MyChart) OR A paper copy in the mail If you have any lab test that is abnormal or we need to change your treatment, we will call you to review the results.   Testing/Procedures: NONE   Follow-Up: At [facility], you and your health needs are our priority.  As part of our continuing mission to provide you with exceptional heart care, we have created designated Provider Care Teams.  These Care Teams include your primary Cardiologist (physician) and Advanced Practice Providers (APPs -  Physician Assistants and Nurse Practitioners) who all work together to provide you with the care you need, when you need it.  We recommend signing up for the patient portal called "MyChart".  Sign up information is provided on this After Visit Summary.  MyChart is used to connect with patients for Virtual Visits (Telemedicine).  Patients are able to view lab/test results, encounter notes, upcoming appointments, etc.  Non-urgent messages can be sent to your provider as well.   To learn more about what you can do with MyChart, go to [email or web address].    Your next appointment:   1 year(s)  The format for your next appointment:   In Person  Provider:   Philip Nahser, MD     Other Instructions NONE  Important Information About Sugar       

## 2022-09-25 ENCOUNTER — Other Ambulatory Visit: Payer: Self-pay | Admitting: *Deleted

## 2022-09-25 DIAGNOSIS — M545 Low back pain, unspecified: Secondary | ICD-10-CM

## 2022-09-25 MED ORDER — MELOXICAM 7.5 MG PO TABS: 7.5000 mg | ORAL_TABLET | Freq: Every day | ORAL | 0 refills | Status: AC | PRN

## 2022-09-25 NOTE — Telephone Encounter (Signed)
Ok to refill but she needs a TOC appt

## 2022-10-24 ENCOUNTER — Other Ambulatory Visit: Payer: Self-pay | Admitting: Family Medicine

## 2022-10-24 DIAGNOSIS — M545 Low back pain, unspecified: Secondary | ICD-10-CM

## 2023-01-06 ENCOUNTER — Other Ambulatory Visit: Payer: Self-pay | Admitting: Cardiovascular Disease

## 2023-02-12 ENCOUNTER — Encounter: Payer: Self-pay | Admitting: Family Medicine

## 2023-02-12 ENCOUNTER — Ambulatory Visit (INDEPENDENT_AMBULATORY_CARE_PROVIDER_SITE_OTHER): Payer: Medicare Other | Admitting: Family Medicine

## 2023-02-12 VITALS — BP 130/92 | HR 70 | Temp 98.1°F | Ht 64.5 in | Wt 161.7 lb

## 2023-02-12 DIAGNOSIS — H6691 Otitis media, unspecified, right ear: Secondary | ICD-10-CM

## 2023-02-12 DIAGNOSIS — I10 Essential (primary) hypertension: Secondary | ICD-10-CM

## 2023-02-12 DIAGNOSIS — E7800 Pure hypercholesterolemia, unspecified: Secondary | ICD-10-CM | POA: Diagnosis not present

## 2023-02-12 DIAGNOSIS — R739 Hyperglycemia, unspecified: Secondary | ICD-10-CM

## 2023-02-12 DIAGNOSIS — H60391 Other infective otitis externa, right ear: Secondary | ICD-10-CM | POA: Diagnosis not present

## 2023-02-12 DIAGNOSIS — N393 Stress incontinence (female) (male): Secondary | ICD-10-CM

## 2023-02-12 MED ORDER — AMOXICILLIN 500 MG PO CAPS: 500.0000 mg | ORAL_CAPSULE | Freq: Three times a day (TID) | ORAL | 0 refills | Status: AC

## 2023-02-12 MED ORDER — CIPROFLOXACIN-DEXAMETHASONE 0.3-0.1 % OT SUSP: 4.0000 [drp] | Freq: Two times a day (BID) | OTIC | 0 refills | Status: AC

## 2023-02-12 NOTE — Assessment & Plan Note (Signed)
Not on medication curently. Needs new lipid panel done this year. Orders placed

## 2023-02-12 NOTE — Assessment & Plan Note (Signed)
Current hypertension medications:       Sig   carvedilol (COREG) 12.5 MG tablet (Taking) TAKE 1 TABLET BY MOUTH TWICE A DAY   hydrochlorothiazide (HYDRODIURIL) 25 MG tablet (Taking) TAKE 1 TABLET BY MOUTH EVERY DAY      BP is elevated slightly today due to pain, usually her BP is well controlled, will continue medications as prescribed, she sees cardiology also.

## 2023-02-12 NOTE — Patient Instructions (Addendum)
Debrox ear drops-- drops in ear 2 times per week  Ok to take 15 mg daily of the meloxicam if needed

## 2023-02-12 NOTE — Progress Notes (Signed)
Established Patient Office Visit  Subjective   Patient ID: Elizabeth Armstrong, female    DOB: 07-18-1956  Age: 67 y.o. MRN: [id number]  Chief Complaint  Patient presents with   Establish Care   Ear Pain    Patient complains of throbbing right ear pain x1 week, worse past 4 days, using Tylenol with no relief    Pt is here for TOC visit.   Pt has an acute problem-- states that she is having severe ear pain on the right for about 1 week. States there wasn't anything that really caused it-- no fever or chills. No nasal congestion or URI symptoms. Left one is fine, she wears ear plugs  and ipods every night to help reduce sound for sleeping.   HTN -- BP in office performed and is well controlled. She  reports no side effects to the medications, no chest pain, SOB, dizziness or headaches. She has a BP cuff at home and is checking BP regularly, reports they are in the normal range.   Chronic back pain-- pt is taking meloxicam 7.5 mg once daily on occasion, takes tylenol every day 1000 mg in the morning. States she isn't sure if the meloxicam is helping or not. We discussed other medications and methods to help improve her back pain, including increasing the meloxicam to 15 mg.   Bladder leakage-- pt asking about things she can do to help reduce bladder leakage. She reports that coughing, jumping, bending over, etc is causing some bladder leakage. I gave her handouts on Kegel exercises and I offered to send her to pelvic floor therapy and she is agreeable to this.       Current Outpatient Medications  Medication Instructions   amoxicillin (AMOXIL) 500 mg, Oral, 3 times daily   carvedilol (COREG) 12.5 mg, Oral, 2 times daily   ciprofloxacin-dexamethasone (CIPRODEX) OTIC suspension 4 drops, Right EAR, 2 times daily   Cyanocobalamin (VITAMIN B-12 PO) Oral   hydrochlorothiazide (HYDRODIURIL) 25 MG tablet TAKE 1 TABLET BY MOUTH EVERY DAY   meloxicam (MOBIC) 7.5 mg, Oral, Daily PRN   potassium  chloride (KLOR-CON) 10 MEQ tablet TAKE 1 TABLET BY MOUTH EVERY DAY    Patient Active Problem List   Diagnosis Date Noted   LBBB (left bundle branch block) 07/05/2021   Nonspecific abnormal electrocardiogram (ECG) (EKG) 12/06/2020   Overweight (BMI 25.0-29.9) 06/15/2020   Osteoarthritis of right hip 06/14/2020   Status post right hip replacement 06/14/2020   B12 deficiency 04/17/2019   Osteoporosis 03/12/2017   Melanoma of skin ([place]) 08/25/2015   Palpitations 08/25/2015   Vaginal atrophy 08/25/2015   Essential hypertension 11/19/2014   Pure hypercholesterolemia 11/19/2014   Osteoarthritis 11/19/2014      Review of Systems  All other systems reviewed and are negative.     Objective:     BP (!) 130/92 (BP Location: Right Arm, Patient Position: Sitting, Cuff Size: Normal)   Pulse 70   Temp 98.1 F (36.7 C) (Oral)   Ht 5' 4.5" (1.638 m)   Wt 161 lb 11.2 oz (73.3 kg)   SpO2 98%   BMI 27.33 kg/m    Physical Exam Vitals reviewed.  Constitutional:      Appearance: Normal appearance. She is well-groomed and normal weight.  HENT:     Right Ear: Tympanic membrane is scarred and bulging.     Left Ear: Tympanic membrane normal.  Eyes:     Conjunctiva/sclera: Conjunctivae normal.  Neck:     Thyroid: No  thyromegaly.  Cardiovascular:     Rate and Rhythm: Normal rate and regular rhythm.     Pulses: Normal pulses.     Heart sounds: S1 normal and S2 normal.  Pulmonary:     Effort: Pulmonary effort is normal.     Breath sounds: Normal breath sounds and air entry.  Musculoskeletal:     Right lower leg: No edema.     Left lower leg: No edema.  Neurological:     Mental Status: She is alert and oriented to person, place, and time. Mental status is at baseline.     Gait: Gait is intact.  Psychiatric:        Mood and Affect: Mood and affect normal.        Speech: Speech normal.        Behavior: Behavior normal.        Judgment: Judgment normal.      No results found for  any visits on 02/12/23.  Last metabolic panel Lab Results  Component Value Date   GLUCOSE 102 (H) 03/30/2022   NA 133 (L) 03/30/2022   K 4.1 03/30/2022   CL 96 03/30/2022   CO2 30 03/30/2022   BUN 12 03/30/2022   CREATININE 0.72 03/30/2022   GFRNONAA >60 10/08/2021   CALCIUM 9.8 03/30/2022   PROT 6.9 03/30/2022   ALBUMIN 4.5 03/30/2022   BILITOT 0.7 03/30/2022   ALKPHOS 58 03/30/2022   AST 17 03/30/2022   ALT 11 03/30/2022   ANIONGAP 7 10/08/2021   Last lipids Lab Results  Component Value Date   CHOL 232 (H) 03/30/2022   HDL 102.10 03/30/2022   LDLCALC 116 (H) 03/30/2022   TRIG 69.0 03/30/2022   CHOLHDL 2 03/30/2022      The ASCVD Risk score (Arnett DK, et al., 2019) failed to calculate for the following reasons:   The valid HDL cholesterol range is 20 to 100 mg/dL    Assessment & Plan:   Problem List Items Addressed This Visit       Unprioritized   Essential hypertension    Current hypertension medications:       Sig   carvedilol (COREG) 12.5 MG tablet (Taking) TAKE 1 TABLET BY MOUTH TWICE A DAY   hydrochlorothiazide (HYDRODIURIL) 25 MG tablet (Taking) TAKE 1 TABLET BY MOUTH EVERY DAY     BP is elevated slightly today due to pain, usually her BP is well controlled, will continue medications as prescribed, she sees cardiology also.       Relevant Orders   CMP   TSH   Pure hypercholesterolemia    Not on medication curently. Needs new lipid panel done this year. Orders placed      Relevant Orders   Lipid Panel   Other Visit Diagnoses     Other infective acute otitis externa of right ear    -  Primary   Relevant Medications   Pt wears ear plugs every night for sleep. I recommended using ciprodex drops and she should switch to disposable ear plugs to avoid buildup of bacteria.   ciprofloxacin-dexamethasone (CIPRODEX) OTIC suspension   Right otitis media, unspecified otitis media type       Relevant Medications   TM is opacified and the canal is  very erythematous. It is difficult for me to determine if the middle ear is also infected. I will treat empirically with amoxicillin 500 mg TID for 10 days.  amoxicillin (AMOXIL) 500 MG capsule   Stress incontinence  Relevant Orders   Sending to PT for pelvic floor therapy.  Ambulatory referral to Physical Therapy   Hyperglycemia, unspecified       Relevant Orders   Seen on CMP last year which I reviewed with patient will order A1C to screen for DM.   Hemoglobin A1c       Return in about 1 year (around 02/12/2024) for HTN.    Farrel Conners, MD

## 2023-04-01 ENCOUNTER — Other Ambulatory Visit (INDEPENDENT_AMBULATORY_CARE_PROVIDER_SITE_OTHER): Payer: Medicare Other

## 2023-04-01 DIAGNOSIS — E7800 Pure hypercholesterolemia, unspecified: Secondary | ICD-10-CM

## 2023-04-01 DIAGNOSIS — I10 Essential (primary) hypertension: Secondary | ICD-10-CM

## 2023-04-01 DIAGNOSIS — R739 Hyperglycemia, unspecified: Secondary | ICD-10-CM | POA: Diagnosis not present

## 2023-04-01 LAB — LIPID PANEL
Cholesterol: 214 mg/dL — ABNORMAL HIGH (ref 0–200)
HDL: 88.3 mg/dL (ref >39.00)
LDL Cholesterol: 113 mg/dL — ABNORMAL HIGH (ref 0–99)
NonHDL: 125.58
Total CHOL/HDL Ratio: 2
Triglycerides: 65 mg/dL (ref 0.0–149.0)
VLDL: 13 mg/dL (ref 0.0–40.0)

## 2023-04-01 LAB — COMPREHENSIVE METABOLIC PANEL
ALT: 14 U/L (ref 0–35)
AST: 19 U/L (ref 0–37)
Albumin: 4.2 g/dL (ref 3.5–5.2)
Alkaline Phosphatase: 59 U/L (ref 39–117)
BUN: 13 mg/dL (ref 6–23)
CO2: 27 mEq/L (ref 19–32)
Calcium: 9.7 mg/dL (ref 8.4–10.5)
Chloride: 96 mEq/L (ref 96–112)
Creatinine, Ser: 0.74 mg/dL (ref 0.40–1.20)
GFR: 82.83 mL/min (ref >60.00)
Glucose, Bld: 92 mg/dL (ref 70–99)
Potassium: 4.3 mEq/L (ref 3.5–5.1)
Sodium: 131 mEq/L — ABNORMAL LOW (ref 135–145)
Total Bilirubin: 0.6 mg/dL (ref 0.2–1.2)
Total Protein: 6.7 g/dL (ref 6.0–8.3)

## 2023-04-01 LAB — TSH: TSH: 1.85 u[IU]/mL (ref 0.35–5.50)

## 2023-04-01 LAB — HEMOGLOBIN A1C: Hgb A1c MFr Bld: 5.2 % (ref 4.6–6.5)

## 2023-05-21 ENCOUNTER — Encounter: Payer: Medicare Other | Admitting: Family Medicine

## 2023-06-03 LAB — HM MAMMOGRAPHY

## 2023-07-04 ENCOUNTER — Other Ambulatory Visit: Payer: Self-pay | Admitting: Cardiovascular Disease

## 2023-07-05 ENCOUNTER — Other Ambulatory Visit: Payer: Self-pay | Admitting: Cardiovascular Disease

## 2023-09-14 ENCOUNTER — Other Ambulatory Visit: Payer: Self-pay | Admitting: Cardiovascular Disease

## 2023-09-17 ENCOUNTER — Other Ambulatory Visit: Payer: Self-pay | Admitting: Cardiovascular Disease

## 2023-09-17 ENCOUNTER — Encounter: Payer: Self-pay | Admitting: Family Medicine

## 2023-09-26 ENCOUNTER — Other Ambulatory Visit: Payer: Self-pay | Admitting: Cardiovascular Disease

## 2023-10-06 ENCOUNTER — Other Ambulatory Visit: Payer: Self-pay | Admitting: Cardiovascular Disease

## 2023-10-25 ENCOUNTER — Other Ambulatory Visit: Payer: Self-pay | Admitting: Cardiovascular Disease

## 2023-11-11 ENCOUNTER — Telehealth: Payer: Self-pay | Admitting: Cardiovascular Disease

## 2023-11-11 NOTE — Telephone Encounter (Signed)
*  STAT* If patient is at the pharmacy, call can be transferred to refill team.   1. Which medications need to be refilled? (please list name of each medication and dose if known) hydrochlorothiazide (HYDRODIURIL) 25 MG tablet   2. Which pharmacy/location (including street and city if local pharmacy) is medication to be sent to? [id number] [place], [place] - [address]   3. Do they need a 30 day or 90 day supply? 90  Patient was returning call. Please advise

## 2023-11-12 MED ORDER — HYDROCHLOROTHIAZIDE 25 MG PO TABS: 25.0000 mg | ORAL_TABLET | Freq: Every day | ORAL | 0 refills | Status: DC

## 2023-11-19 ENCOUNTER — Other Ambulatory Visit: Payer: Self-pay | Admitting: Cardiovascular Disease

## 2023-11-19 ENCOUNTER — Encounter: Payer: Self-pay | Admitting: Family Medicine

## 2023-11-19 ENCOUNTER — Ambulatory Visit (INDEPENDENT_AMBULATORY_CARE_PROVIDER_SITE_OTHER): Payer: Medicare Other | Admitting: Family Medicine

## 2023-11-19 DIAGNOSIS — Z0000 Encounter for general adult medical examination without abnormal findings: Secondary | ICD-10-CM | POA: Diagnosis not present

## 2023-11-19 NOTE — Progress Notes (Signed)
"  Patient was unable to self-report due to a lack of equipment at home via telehealth"

## 2023-11-19 NOTE — Progress Notes (Signed)
PATIENT CHECK-IN and HEALTH RISK ASSESSMENT QUESTIONNAIRE:  -completed by phone/video for upcoming Medicare Preventive Visit   Pre-Visit Check-in: 1)Vitals (height, wt, BP, etc) - record in vitals section for visit on day of visit Request home vitals (wt, BP, etc.) and enter into vitals, THEN update Vital Signs SmartPhrase below at the top of the HPI. See below.  2)Review and Update Medications, Allergies PMH, Surgeries, Social history in Epic 3)Hospitalizations in the last year with date/reason? No   4)Review and Update Care Team (patient's specialists) in Epic 5) Complete PHQ9 in Epic  6) Complete Fall Screening in Epic 7)Review all Health Maintenance Due and order under PCP if not done.  Medicare Wellness Patient Questionnaire:  Answer theses question about your habits: How often do you have a drink containing alcohol?yes, 2-3 times a week  How many drinks containing alcohol do you have on a typical day when you are drinking?1-2 glasses  How often do you have six or more drinks on one occasion?no  Have you ever smoked?NO  Quit date if applicable? NA  How many packs a day do/did you smoke? Na  Do you use smokeless tobacco?NO  Do you use an illicit drugs?NO  On average, how many days per week do you engage in moderate to strenuous exercise (like a brisk walk)?5 days a week  On average, how many minutes do you engage in exercise at this level? 40-60 Minutes  Are you sexually active? Yes Number of partners?one  Typical breakfast: yogurt, fruit, nuts  Typical lunch:None  Typical dinner:Meat, Vegetable, salad, potatoes Typical snacks:Nuts  Beverages: Water, coffee   Answer theses question about your everyday activities: Can you perform most household chores?Yes Are you deaf or have significant trouble hearing?NO  Do you feel that you have a problem with memory?NO  Do you feel safe at home?Yes  Last dentist visit?Last week  8. Do you have any difficulty performing your everyday  activities?No  Are you having any difficulty walking, taking medications on your own, and or difficulty managing daily home needs?NO  Do you have difficulty walking or climbing stairs?NO  Do you have difficulty dressing or bathing?NO  Do you have difficulty doing errands alone such as visiting a doctor's office or shopping?NO  Do you currently have any difficulty preparing food and eating?NO  Do you currently have any difficulty using the toilet?NO  Do you have any difficulty managing your finances?NO  Do you have any difficulties with housekeeping of managing your housekeeping?NO    Do you have Advanced Directives in place (Living Will, Healthcare Power or Attorney)? YES    Last eye Exam and location?A year ago, Unknown    Do you currently use prescribed or non-prescribed narcotic or opioid pain medications?NO   Do you have a history or close family history of breast, ovarian, tubal or peritoneal cancer or a family member with BRCA (breast cancer susceptibility 1 and 2) gene mutations?NO    Nurse/Assistant Credentials/time stamp:Leah ADelford Field CMA 4:35pm     ----------------------------------------------------------------------------------------------------------------------------------------------------------------------------------------------------------------------  Because this visit was a virtual/telehealth visit, some criteria may be missing or patient reported. Any vitals not documented were not able to be obtained and vitals that have been documented are patient reported.    MEDICARE ANNUAL PREVENTIVE VISIT WITH PROVIDER: (Welcome to Medicare, initial annual wellness or annual wellness exam)  Virtual Visit via Phone Note  I connected with Elizabeth Armstrong on 11/19/23 by phone and verified that I am speaking with the correct person using two identifiers.  Location patient: home Location provider:work or home office Persons participating in the virtual visit:  patient, provider  Concerns and/or follow up today: no concerns   See HM section in Epic for other details of completed HM.    ROS: negative for report of fevers, unintentional weight loss, vision changes, vision loss, hearing loss or change, chest pain, sob, hemoptysis, melena, hematochezia, hematuria, falls, bleeding or bruising, thoughts of suicide or self harm, memory loss  Patient-completed extensive health risk assessment - reviewed and discussed with the patient: See Health Risk Assessment completed with patient prior to the visit either above or in recent phone note. This was reviewed in detailed with the patient today and appropriate recommendations, orders and referrals were placed as needed per Summary below and patient instructions.   Review of Medical History: -PMH, PSH, Family History and current specialty and care providers reviewed and updated and listed below   Patient Care Team: Karie Georges, MD as PCP - General (Family Medicine) Nahser, Deloris Ping, MD as PCP - Cardiology (Cardiology) Glyn Ade, PA-C as Physician Assistant (Dermatology)   Past Medical History:  Diagnosis Date   Atypical mole 04/11/2007   SEVERE LEFT MID BACK- WIDER SHAVE   Atypical mole 01/15/2014   MODERATE-LEFT THIGH UPPER- NO TREATMENT   Atypical mole 08/03/2015   MODERATE-LEFT LOWER BACK- NO TREATMENT   Atypical mole 08/03/2015   SEVERE-LEFT UPPER PARASPINAL- WIDER SHAVE   Atypical mole 08/03/2015   MILD-RIGHT INNER KNEE-NO TREATMENT   Atypical mole 11/15/2015   SEVERE-RIGHT POST THIGH-WIDER SHAVE   Atypical mole 12/07/2016   MILD-LEFT LOW BACK- NO TREATMENT   B12 deficiency 04/17/2019   Basal cell carcinoma 01/15/2014   nodular chest-cx3,5FU   BCC (basal cell carcinoma of skin) 03/07/2017   NODULAR CHEST-TX AFTER BIOPSY   Essential hypertension 11/19/2014   Heart murmur    HTN (hypertension)    Hypercholesteremia    Melanoma in situ (HCC) 01/15/2014   LOWER LEFT BACK-  MOHS DR. Lorn Junes   Melanoma of skin (HCC) 08/25/2015   sees dermatologist at least twice per year, Dr. Jorja Loa   Osteoarthritis 11/19/2014   Osteoarthrosis, unspecified whether generalized or localized, involving lower leg 11/19/2014   of knees, s/p knee replacements bilat, sees [facility] orth[facility]   Osteopenia 01/04/2017   Palpitations 08/25/2015   Palpitations    Pneumonia    Pure hypercholesterolemia 11/19/2014   SCCA (squamous cell carcinoma) of skin 11/13/2017   MID CHEST-C X 3, 5FU   Vaginal atrophy 08/25/2015    Past Surgical History:  Procedure Laterality Date   APPENDECTOMY  2009   CESAREAN SECTION     JOINT REPLACEMENT Bilateral    knees   KNEE SURGERY  2009   left   OPEN REDUCTION INTERNAL FIXATION (ORIF) METACARPAL Left 06/19/2018   Procedure: LEFT SMALL FINGER OPEN REDUCTION INTERNAL FIXATION (ORIF) CARPOMETACARPAL FUSION;  Surgeon: Betha Loa, MD;  Location: [facility];  Service: Orthopedics;  Laterality: Left;   TOTAL HIP ARTHROPLASTY Right 06/14/2020   Procedure: TOTAL HIP ARTHROPLASTY ANTERIOR APPROACH;  Surgeon: Durene Romans, MD;  Location: [facility];  Service: Orthopedics;  Laterality: Right;  70 mins   WRIST SURGERY  2007   right    Social History   Socioeconomic History   Marital status: Married    Spouse name: Not on file   Number of children: 1   Years of education: Not on file   Highest education level: 12th grade  Occupational History   Occupation:  Transport planner  Tobacco Use   Smoking status: Never   Smokeless tobacco: Never  Vaping Use   Vaping status: Never Used  Substance and Sexual Activity   Alcohol use: Yes    Alcohol/week: 3.0 standard drinks of alcohol    Types: 3 Glasses of wine per week    Comment: social   Drug use: Never   Sexual activity: Not Currently  Other Topics Concern   Not on file  Social History Narrative   Work or School: Data processing manager Situation: lives with husband      Spiritual Beliefs: catholic       Lifestyle: very active with cycling and tennis; healthy diet      Social Drivers of Health   Financial Resource Strain: Low Risk  (05/16/2022)   Overall Financial Resource Strain (CARDIA)    Difficulty of Paying Living Expenses: Not hard at all  Food Insecurity: No Food Insecurity (05/16/2022)   Hunger Vital Sign    Worried About Running Out of Food in the Last Year: Never true    Ran Out of Food in the Last Year: Never true  Transportation Needs: No Transportation Needs (05/16/2022)   PRAPARE - Administrator, Civil Service (Medical): No    Lack of Transportation (Non-Medical): No  Physical Activity: Sufficiently Active (05/16/2022)   Exercise Vital Sign    Days of Exercise per Week: 7 days    Minutes of Exercise per Session: 60 min  Stress: No Stress Concern Present (05/16/2022)   [employer] of Occupational Health - Occupational Stress Questionnaire    Feeling of Stress : Not at all  Social Connections: Socially Integrated (05/16/2022)   Social Connection and Isolation Panel [NHANES]    Frequency of Communication with Friends and Family: More than three times a week    Frequency of Social Gatherings with Friends and Family: More than three times a week    Attends Religious Services: More than 4 times per year    Active Member of [employer] or Organizations: No    Attends Engineer, structural: More than 4 times per year    Marital Status: Married  Catering manager Violence: Not At Risk (05/16/2022)   Humiliation, Afraid, Rape, and Kick questionnaire    Fear of Current or Ex-Partner: No    Emotionally Abused: No    Physically Abused: No    Sexually Abused: No    Family History  Problem Relation Age of Onset   Heart disease Other     Current Outpatient Medications on File Prior to Visit  Medication Sig Dispense Refill   carvedilol (COREG) 12.5 MG tablet TAKE 1 TABLET BY MOUTH 2 TIMES DAILY. 30 tablet 0   Cyanocobalamin (VITAMIN B-12 PO) Take by mouth.      hydrochlorothiazide (HYDRODIURIL) 25 MG tablet Take 1 tablet (25 mg total) by mouth daily. 90 tablet 0   meloxicam (MOBIC) 7.5 MG tablet Take 1 tablet (7.5 mg total) by mouth daily as needed for pain. 30 tablet 0   potassium chloride (KLOR-CON) 10 MEQ tablet TAKE 1 TABLET (10 MEQ TOTAL) BY MOUTH DAILY. PLEASE CALL [phone] TO SCHEDULE AN APPOINTMENT WITH DR. Elease Hashimoto FOR FUTURE REFILLS. THANK YOU. 30 tablet 11   No current facility-administered medications on file prior to visit.    No Active Allergies      Physical Exam Vitals requested from patient and listed below if patient had equipment and was able to obtain at home for  this virtual visit: There were no vitals filed for this visit. Estimated body mass index is 27.33 kg/m as calculated from the following:   Height as of 02/12/23: 5' 4.5" (1.638 m).   Weight as of 02/12/23: 161 lb 11.2 oz (73.3 kg).  EKG (optional): deferred due to virtual visit  GENERAL: alert, oriented, no acute distress detected, full vision exam deferred due to pandemic and/or virtual encounter  PSYCH/NEURO: pleasant and cooperative, no obvious depression or anxiety, speech and thought processing grossly intact, Cognitive function grossly intact  Flowsheet Row Office Visit from 02/12/2023 in [facility] [place]  PHQ-9 Total Score 0           02/12/2023    2:41 PM 05/16/2022    1:48 PM 03/30/2022   10:30 AM 12/26/2020    1:24 PM 07/03/2019    8:56 AM  Depression screen PHQ 2/9  Decreased Interest 0 0 0 0 0  Down, Depressed, Hopeless 0 0 0 0 0  PHQ - 2 Score 0 0 0 0 0  Altered sleeping 0 0 1    Tired, decreased energy 0 0 1    Change in appetite 0 0 0    Feeling bad or failure about yourself  0 0 0    Trouble concentrating 0 0 0    Moving slowly or fidgety/restless 0 0 0    Suicidal thoughts 0 0 0    PHQ-9 Score 0 0 2    Difficult doing work/chores   Not difficult at all         03/29/2022    8:42 AM 03/30/2022   10:34  AM 05/16/2022    1:51 PM 11/19/2023    2:55 PM 11/19/2023    4:26 PM  Fall Risk  Falls in the past year? 0  0 0 0  0  Was there an injury with Fall?  0 0  0  Fall Risk Category Calculator  0 0  0  Fall Risk Category (Retired)  Low Low    (RETIRED) Patient Fall Risk Level  Low fall risk Low fall risk    Patient at Risk for Falls Due to  No Fall Risks No Fall Risks  No Fall Risks  Fall risk Follow up  Falls evaluation completed   Falls evaluation completed     Patient-reported     SUMMARY AND PLAN:  Encounter for Medicare annual wellness exam  Discussed applicable health maintenance/preventive health measures and advised and referred or ordered per patient preferences: -last bone density last year, discussed bone building exercises and adequate intake of Vit D and Calcium through various sources  Health Maintenance  Topic Date Due   COVID-19 Vaccine (8 - 2024-25 season) 12/05/2023 (Originally 11/12/2023)   Medicare Annual Wellness (AWV)  11/18/2024   MAMMOGRAM  06/02/2025   Colonoscopy  08/15/2026   DTaP/Tdap/Td (3 - Td or Tdap) 04/12/2028   Pneumonia Vaccine 49+ Years old  Completed   INFLUENZA VACCINE  Completed   DEXA SCAN  Completed   Hepatitis C Screening  Completed   Zoster Vaccines- Shingrix  Completed   HPV VACCINES  Aged Out      Education and counseling on the following was provided based on the above review of health and a plan/checklist for the patient, along with additional information discussed, was provided for the patient in the patient instructions :    -Advised and counseled on a healthy lifestyle - including the importance of a healthy diet, regular  physical activity, social connections and stress management. -Reviewed patient's current diet. Advised and counseled on a whole foods based healthy diet. A summary of a healthy diet was provided in the Patient Instructions.  -reviewed patient's current physical activity level and discussed exercise guidelines  for adults. Discussed community resources and ideas for safe exercise at home to assist in meeting exercise guideline recommendations in a safe and healthy way.  -Advise yearly dental visits at minimum and regular eye exams -Advised and counseled on alcohol safe limits, risks - advised no more than one alcoholic beverage per day  Follow up: see patient instructions     Patient Instructions  I really enjoyed getting to talk with you today! I am available on Tuesdays and Thursdays for virtual visits if you have any questions or concerns, or if I can be of any further assistance.   CHECKLIST FROM ANNUAL WELLNESS VISIT:  -Follow up (please call to schedule if not scheduled after visit):   -yearly for annual wellness visit with primary care office  Here is a list of your preventive care/health maintenance measures and the plan for each if any are due:  PLAN For any measures below that may be due:   Health Maintenance  Topic Date Due   COVID-19 Vaccine (8 - 2024-25 season) 12/05/2023 (Originally 11/12/2023)   Medicare Annual Wellness (AWV)  11/18/2024   MAMMOGRAM  06/02/2025   Colonoscopy  08/15/2026   DTaP/Tdap/Td (3 - Td or Tdap) 04/12/2028   Pneumonia Vaccine 13+ Years old  Completed   INFLUENZA VACCINE  Completed   DEXA SCAN  Completed   Hepatitis C Screening  Completed   Zoster Vaccines- Shingrix  Completed   HPV VACCINES  Aged Out    -See a dentist at least yearly  -Get your eyes checked and then per your eye specialist's recommendations  -Other issues addressed today:   -I have included below further information regarding a healthy whole foods based diet, physical activity guidelines for adults, stress management and opportunities for social connections. I hope you find this information useful.    -----------------------------------------------------------------------------------------------------------------------------------------------------------------------------------------------------------------------------------------------------------  NUTRITION: -eat real food: lots of colorful vegetables (half the plate) and fruits -5-7 servings of vegetables and fruits per day (fresh or steamed is best), exp. 2 servings of vegetables with lunch and dinner and 2 servings of fruit per day. Berries and greens such as kale and collards are great choices.  -consume on a regular basis: whole grains (make sure first ingredient on label contains the word "whole"), fresh fruits, fish, nuts, seeds, healthy oils (such as olive oil, avocado oil, grape seed oil) -may eat small amounts of dairy and lean meat on occasion, but avoid processed meats such as ham, bacon, lunch meat, etc. -drink water -try to avoid fast food and pre-packaged foods, processed meat -most experts advise limiting sodium to < 2300mg  per day, should limit further is any chronic conditions such as high blood pressure, heart disease, diabetes, etc. The American Heart Association advised that < 1500mg  is is ideal -try to avoid foods that contain any ingredients with names you do not recognize  -try to avoid sugar/sweets (except for the natural sugar that occurs in fresh fruit) -try to avoid sweet drinks -try to avoid white rice, white bread, pasta (unless whole grain), white or yellow potatoes  EXERCISE GUIDELINES FOR ADULTS: -if you wish to increase your physical activity, do so gradually and with the approval of your doctor -STOP and seek medical care immediately if you have any  chest pain, chest discomfort or trouble breathing when starting or increasing exercise  -move and stretch your body, legs, feet and arms when sitting for long periods -Physical activity guidelines for optimal health in adults: -least 150 minutes per week of  aerobic exercise (can talk, but not sing) once approved by your doctor, 20-30 minutes of sustained activity or two 10 minute episodes of sustained activity every day.  -resistance training at least 2 days per week if approved by your doctor -balance exercises 3+ days per week:   Stand somewhere where you have something sturdy to hold onto if you lose balance.    1) lift up on toes, start with 5x per day and work up to 20x   2) stand and lift on leg straight out to the side so that foot is a few inches of the floor, start with 5x each side and work up to 20x each side   3) stand on one foot, start with 5 seconds each side and work up to 20 seconds on each side  If you need ideas or help with getting more active:  -Silver sneakers https://tools.silversneakers.com  -Walk with a Doc: http://www.duncan-williams.com/  -try to include resistance (weight lifting/strength building) and balance exercises twice per week: or the following link for ideas: http://castillo-powell.com/  BuyDucts.dk  STRESS MANAGEMENT: -can try meditating, or just sitting quietly with deep breathing while intentionally relaxing all parts of your body for 5 minutes daily -if you need further help with stress, anxiety or depression please follow up with your primary doctor or contact the wonderful folks at [facility]: [phone]  SOCIAL CONNECTIONS: -options in [place] if you wish to engage in more social and exercise related activities:  -Silver sneakers https://tools.silversneakers.com  -Walk with a Doc: http://www.duncan-williams.com/  -Check out the [facility] Active Adults 50+ section on the [place] of [employer] (hiking clubs, book clubs, cards and games, chess, exercise classes, aquatic classes and much more) - see the website for  details: https://www.-[place].gov/departments/parks-recreation/active-adults50  -YouTube has lots of exercise videos for different ages and abilities as well  -[facility] (a variety of indoor and outdoor inperson activities for adults). 6051637015. [address]  -Virtual Online Classes (a variety of topics): see seniorplanet.org or call [phone]) [phone]  -consider volunteering at a school, hospice center, church, senior center or elsewhere           Terressa Koyanagi, DO

## 2023-11-19 NOTE — Patient Instructions (Signed)
I really enjoyed getting to talk with you today! I am available on Tuesdays and Thursdays for virtual visits if you have any questions or concerns, or if I can be of any further assistance.   CHECKLIST FROM ANNUAL WELLNESS VISIT:  -Follow up (please call to schedule if not scheduled after visit):   -yearly for annual wellness visit with primary care office  Here is a list of your preventive care/health maintenance measures and the plan for each if any are due:  PLAN For any measures below that may be due:   Health Maintenance  Topic Date Due   COVID-19 Vaccine (8 - 2024-25 season) 12/05/2023 (Originally 11/12/2023)   Medicare Annual Wellness (AWV)  11/18/2024   MAMMOGRAM  06/02/2025   Colonoscopy  08/15/2026   DTaP/Tdap/Td (3 - Td or Tdap) 04/12/2028   Pneumonia Vaccine 62+ Years old  Completed   INFLUENZA VACCINE  Completed   DEXA SCAN  Completed   Hepatitis C Screening  Completed   Zoster Vaccines- Shingrix  Completed   HPV VACCINES  Aged Out    -See a dentist at least yearly  -Get your eyes checked and then per your eye specialist's recommendations  -Other issues addressed today:   -I have included below further information regarding a healthy whole foods based diet, physical activity guidelines for adults, stress management and opportunities for social connections. I hope you find this information useful.   -----------------------------------------------------------------------------------------------------------------------------------------------------------------------------------------------------------------------------------------------------------  NUTRITION: -eat real food: lots of colorful vegetables (half the plate) and fruits -5-7 servings of vegetables and fruits per day (fresh or steamed is best), exp. 2 servings of vegetables with lunch and dinner and 2 servings of fruit per day. Berries and greens such as kale and collards are great choices.  -consume on a  regular basis: whole grains (make sure first ingredient on label contains the word "whole"), fresh fruits, fish, nuts, seeds, healthy oils (such as olive oil, avocado oil, grape seed oil) -may eat small amounts of dairy and lean meat on occasion, but avoid processed meats such as ham, bacon, lunch meat, etc. -drink water -try to avoid fast food and pre-packaged foods, processed meat -most experts advise limiting sodium to < 2300mg  per day, should limit further is any chronic conditions such as high blood pressure, heart disease, diabetes, etc. The American Heart Association advised that < 1500mg  is is ideal -try to avoid foods that contain any ingredients with names you do not recognize  -try to avoid sugar/sweets (except for the natural sugar that occurs in fresh fruit) -try to avoid sweet drinks -try to avoid white rice, white bread, pasta (unless whole grain), white or yellow potatoes  EXERCISE GUIDELINES FOR ADULTS: -if you wish to increase your physical activity, do so gradually and with the approval of your doctor -STOP and seek medical care immediately if you have any chest pain, chest discomfort or trouble breathing when starting or increasing exercise  -move and stretch your body, legs, feet and arms when sitting for long periods -Physical activity guidelines for optimal health in adults: -least 150 minutes per week of aerobic exercise (can talk, but not sing) once approved by your doctor, 20-30 minutes of sustained activity or two 10 minute episodes of sustained activity every day.  -resistance training at least 2 days per week if approved by your doctor -balance exercises 3+ days per week:   Stand somewhere where you have something sturdy to hold onto if you lose balance.    1) lift up on toes,  start with 5x per day and work up to 20x   2) stand and lift on leg straight out to the side so that foot is a few inches of the floor, start with 5x each side and work up to 20x each side   3)  stand on one foot, start with 5 seconds each side and work up to 20 seconds on each side  If you need ideas or help with getting more active:  -Silver sneakers https://tools.silversneakers.com  -Walk with a Doc: http://www.duncan-williams.com/  -try to include resistance (weight lifting/strength building) and balance exercises twice per week: or the following link for ideas: http://castillo-powell.com/  BuyDucts.dk  STRESS MANAGEMENT: -can try meditating, or just sitting quietly with deep breathing while intentionally relaxing all parts of your body for 5 minutes daily -if you need further help with stress, anxiety or depression please follow up with your primary doctor or contact the wonderful folks at [facility]: [phone]  SOCIAL CONNECTIONS: -options in [place] if you wish to engage in more social and exercise related activities:  -Silver sneakers https://tools.silversneakers.com  -Walk with a Doc: http://www.duncan-williams.com/  -Check out the [facility] Active Adults 50+ section on the [place] [employer] (hiking clubs, book clubs, cards and games, chess, exercise classes, aquatic classes and much more) - see the website for details: https://www.[place].gov/departments/parks-recreation/active-adults50  -YouTube has lots of exercise videos for different ages and abilities as well  -[facility] (a variety of indoor and outdoor inperson activities for adults). [phone]. [address]  -Virtual Online Classes (a variety of topics): see seniorplanet.org or call [phone]  -consider volunteering at a school, hospice center, church, senior center or elsewhere

## 2024-01-02 ENCOUNTER — Encounter: Payer: Self-pay | Admitting: Cardiovascular Disease

## 2024-01-02 NOTE — Progress Notes (Signed)
 Cardiology Office Note:    Date:  01/03/2024   ID:  Elizabeth Armstrong, DOB Feb 20, 1956, MRN [id number]  PCP:  Karie Georges, MD  Cardiologist:  Deannah Rossi   Electrophysiologist:  None   Referring MD: Karie Georges, MD   Chief Complaint  Patient presents with   Hypertension         History of Present Illness:    Elizabeth Armstrong is a 68 y.o. female with a hx of HTN is been having some palpitations for the past several weeks.  HR was elevated at baseline.   Felt flushed.  Occurred at work  BP was elevated several weeks ago  Noticed that her HR was "racing" for a few seconds.   Would last for few seconds .plays golf several tiems a week Light cardio and weights several times a week. Rides stationary bike   Has tried Bystolic ( from Dr. Deborah Chalk) .  Insurance stopped paying for it and she changed to amlodipine a year ago Has gained some weight .   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  Dec, 14,  2020   Elizabeth Armstrong is seen today for follow up of her HTN. BP has been a bit elevated Is exercising some  On Coreg 12. 5 BID,  Is off amlodipine  Takes Mobic 7.5 mg about every day  We will add HCTZ 25 mg a day and Kdur 10 meq a day    Jan. 4, 2022: Elizabeth Armstrong is seen for follow up of her HTN No cp, no dyspnea Has been visiting family for the past several months  Exercising well Playing lots of golf  Hip replacement in mid July   Aug. 3, 2022 Elizabeth Armstrong is seen today for follow up of her HTN She had poor R wave progression at her last EKG.  We performed an echocardiogram to evaluate her for the possibility of silent anterior wall myocardial infarction. Echocardiogram from December 08, 2020 reveals normal left ventricular systolic function.  She has grade 1 diastolic dysfunction.  Trivial mitral  regurgitation.  Jan. 24, 2023: Elizabeth Armstrong is seen today for follow up of her HTN and LBBB Lexiscan myoview showed no ischemia and no infarction Is having some dizziness, Also has tinnitis - worsened recently  Woke up dizzy one morning, worsened Unable to walk without assistance  Is able to exercise now Has been in [place] for 2 months  - visiting daughter .     Oct. 9, 2023  Elizabeth Armstrong is seen for follow up HTN and her LBBB  Lexiscan myoview showed no ischemia and no infarction Hiking , is very active No CP     Jan. 31, 2025 Elizabeth Armstrong is seen for follow up of her HTN, LBBB  Had a low risk myoview  Exercising regularly            Past Medical History:  Diagnosis Date   Atypical mole 04/11/2007   SEVERE LEFT MID BACK- WIDER SHAVE   Atypical mole 01/15/2014   MODERATE-LEFT THIGH UPPER- NO TREATMENT   Atypical mole 08/03/2015   MODERATE-LEFT LOWER BACK- NO TREATMENT   Atypical mole 08/03/2015   SEVERE-LEFT UPPER PARASPINAL- WIDER SHAVE   Atypical mole 08/03/2015   MILD-RIGHT INNER KNEE-NO TREATMENT   Atypical mole 11/15/2015   SEVERE-RIGHT POST THIGH-WIDER SHAVE   Atypical mole 12/07/2016  MILD-LEFT LOW BACK- NO TREATMENT   B12 deficiency 04/17/2019   Basal cell carcinoma 01/15/2014   nodular chest-cx3,5FU   BCC (basal cell carcinoma of skin) 03/07/2017   NODULAR CHEST-TX AFTER BIOPSY   Essential hypertension 11/19/2014   Heart murmur    HTN (hypertension)    Hypercholesteremia    Melanoma in situ (HCC) 01/15/2014   LOWER LEFT BACK- MOHS DR. Lorn Junes   Melanoma of skin (HCC) 08/25/2015   sees dermatologist at least twice per year, Dr. Jorja Loa   Osteoarthritis 11/19/2014   Osteoarthrosis, unspecified whether generalized or localized, involving lower leg 11/19/2014   of knees, s/p knee replacements bilat, sees [facility]   Osteopenia 01/04/2017   Palpitations 08/25/2015   Palpitations    Pneumonia    Pure hypercholesterolemia 11/19/2014   SCCA (squamous  cell carcinoma) of skin 11/13/2017   MID CHEST-C X 3, 5FU   Vaginal atrophy 08/25/2015    Past Surgical History:  Procedure Laterality Date   APPENDECTOMY  2009   CESAREAN SECTION     JOINT REPLACEMENT Bilateral    knees   KNEE SURGERY  2009   left   OPEN REDUCTION INTERNAL FIXATION (ORIF) METACARPAL Left 06/19/2018   Procedure: LEFT SMALL FINGER OPEN REDUCTION INTERNAL FIXATION (ORIF) CARPOMETACARPAL FUSION;  Surgeon: Betha Loa, MD;  Location: [facility];  Service: Orthopedics;  Laterality: Left;   TOTAL HIP ARTHROPLASTY Right 06/14/2020   Procedure: TOTAL HIP ARTHROPLASTY ANTERIOR APPROACH;  Surgeon: Durene Romans, MD;  Location: [facility];  Service: Orthopedics;  Laterality: Right;  70 mins   WRIST SURGERY  2007   right    Current Medications: Current Meds  Medication Sig   carvedilol (COREG) 12.5 MG tablet TAKE 1 TABLET BY MOUTH 2 TIMES DAILY.   Cholecalciferol (VITAMIN D3) POWD Take by mouth.   Cyanocobalamin (VITAMIN B-12 PO) Take by mouth.   hydrochlorothiazide (HYDRODIURIL) 25 MG tablet Take 1 tablet (25 mg total) by mouth daily.   meloxicam (MOBIC) 7.5 MG tablet Take 1 tablet (7.5 mg total) by mouth daily as needed for pain.   potassium chloride (KLOR-CON) 10 MEQ tablet TAKE 1 TABLET (10 MEQ TOTAL) BY MOUTH DAILY. PLEASE CALL [phone] TO SCHEDULE AN APPOINTMENT WITH DR. Elease Hashimoto FOR FUTURE REFILLS. THANK YOU.     Allergies:   Patient has no active allergies.   Social History   Socioeconomic History   Marital status: Married    Spouse name: Not on file   Number of children: 1   Years of education: Not on file   Highest education level: 12th grade  Occupational History   Occupation: Transport planner  Tobacco Use   Smoking status: Never   Smokeless tobacco: Never  Vaping Use   Vaping status: Never Used  Substance and Sexual Activity   Alcohol use: Yes    Alcohol/week: 3.0 standard drinks of alcohol    Types: 3 Glasses of wine per week    Comment:  social   Drug use: Never   Sexual activity: Not Currently  Other Topics Concern   Not on file  Social History Narrative   Work or School: Data processing manager Situation: lives with husband      Spiritual Beliefs: catholic      Lifestyle: very active with cycling and tennis; healthy diet      Social Drivers of Health   Financial Resource Strain: Low Risk  (05/16/2022)   Overall Financial Resource Strain (CARDIA)    Difficulty of  Paying Living Expenses: Not hard at all  Food Insecurity: No Food Insecurity (05/16/2022)   Hunger Vital Sign    Worried About Running Out of Food in the Last Year: Never true    Ran Out of Food in the Last Year: Never true  Transportation Needs: No Transportation Needs (05/16/2022)   PRAPARE - Administrator, Civil Service (Medical): No    Lack of Transportation (Non-Medical): No  Physical Activity: Sufficiently Active (05/16/2022)   Exercise Vital Sign    Days of Exercise per Week: 7 days    Minutes of Exercise per Session: 60 min  Stress: No Stress Concern Present (05/16/2022)   [employer] of Occupational Health - Occupational Stress Questionnaire    Feeling of Stress : Not at all  Social Connections: Socially Integrated (05/16/2022)   Social Connection and Isolation Panel [NHANES]    Frequency of Communication with Friends and Family: More than three times a week    Frequency of Social Gatherings with Friends and Family: More than three times a week    Attends Religious Services: More than 4 times per year    Active Member of [employer] or Organizations: No    Attends Engineer, structural: More than 4 times per year    Marital Status: Married     Family History: The patient's family history includes Heart disease in an other family member.  ROS:   Please see the history of present illness.     All other systems reviewed and are negative.  EKGs/Labs/Other Studies Reviewed:    The following studies were reviewed  today:    Recent Labs: 04/01/2023: ALT 14; BUN 13; Creatinine, Ser 0.74; Potassium 4.3; Sodium 131; TSH 1.85  Recent Lipid Panel    Component Value Date/Time   CHOL 214 (H) 04/01/2023 0807   TRIG 65.0 04/01/2023 0807   HDL 88.30 04/01/2023 0807   CHOLHDL 2 04/01/2023 0807   VLDL 13.0 04/01/2023 0807   LDLCALC 113 (H) 04/01/2023 0807   LDLCALC 121 (H) 07/15/2020 0844    Physical Exam:     Physical Exam: Blood pressure 120/78, pulse 74, height 5\' 4"  (1.626 m), weight 154 lb 9.6 oz (70.1 kg), SpO2 98%.       GEN:  Well nourished, well developed in no acute distress HEENT: Normal NECK: No JVD; No carotid bruits LYMPHATICS: No lymphadenopathy CARDIAC: RRR , no murmurs, rubs, gallops RESPIRATORY:  Clear to auscultation without rales, wheezing or rhonchi  ABDOMEN: Soft, non-tender, non-distended MUSCULOSKELETAL:  No edema; No deformity  SKIN: Warm and dry NEUROLOGIC:  Alert and oriented x 3     EKG:    EKG Interpretation Date/Time:  Friday January 03 2024 08:33:17 EST Ventricular Rate:  62 PR Interval:  156 QRS Duration:  120 QT Interval:  490 QTC Calculation: 497 R Axis:   47  Text Interpretation: Normal sinus rhythm Minimal voltage criteria for LVH, may be normal variant ( Cornell product ) Septal infarct , age undetermined When compared with ECG of 08-Oct-2021 13:29, No significant change since last tracing Confirmed by Kristeen Miss ([id number]) on 01/03/2024 10:02:08 AM     ASSESSMENT:    1. Essential hypertension   2. LBBB (left bundle branch block)   3. Hyperlipidemia, unspecified hyperlipidemia type        PLAN:        Hypertension:   Blood pressure is well-controlled.  Continue current medications.  2.  LBBB :    Myoview study was normal.  Echocardiogram reveals normal left ventricular systolic function.    Medication Adjustments/Labs and Tests Ordered: Current medicines are reviewed at length with the patient today.  Concerns regarding medicines  are outlined above.  Orders Placed This Encounter  Procedures   CT CARDIAC SCORING (SELF PAY ONLY)   EKG 12-Lead    No orders of the defined types were placed in this encounter.     Patient Instructions  Testing/Procedures: Your physician has requested that you have a coronary calcium score performed. This is not covered by insurance and will be an out-of-pocket cost of approximately $99.    Follow-Up: At [facility], you and your health needs are our priority.  As part of our continuing mission to provide you with exceptional heart care, we have created designated Provider Care Teams.  These Care Teams include your primary Cardiologist (physician) and Advanced Practice Providers (APPs -  Physician Assistants and Nurse Practitioners) who all work together to provide you with the care you need, when you need it.  We recommend signing up for the patient portal called "MyChart".  Sign up information is provided on this After Visit Summary.  MyChart is used to connect with patients for Virtual Visits (Telemedicine).  Patients are able to view lab/test results, encounter notes, upcoming appointments, etc.  Non-urgent messages can be sent to your provider as well.   To learn more about what you can do with MyChart, go to [email or web address].    Your next appointment:   1 year(s)  Provider:   Tessa Lerner, DO  Other Instructions   1st Floor: - Lobby - Registration  - Pharmacy  - Lab - Cafe  2nd Floor: - PV Lab - Diagnostic Testing (echo, CT, nuclear med)  3rd Floor: - Vacant  4th Floor: - TCTS (cardiothoracic surgery) - AFib Clinic - Structural Heart Clinic - Vascular Surgery  - Vascular Ultrasound  5th Floor: - HeartCare Cardiology (general and EP) - Clinical Pharmacy for coumadin, hypertension, lipid, weight-loss medications, and med management appointments    Valet parking services will be available as well.          Signed, Kristeen Miss, MD   01/03/2024 9:57 AM    [facility]

## 2024-01-03 ENCOUNTER — Other Ambulatory Visit: Payer: Self-pay | Admitting: Cardiovascular Disease

## 2024-01-03 ENCOUNTER — Encounter: Payer: Self-pay | Admitting: Cardiovascular Disease

## 2024-01-03 ENCOUNTER — Ambulatory Visit: Payer: Medicare Other | Attending: Cardiovascular Disease | Admitting: Cardiovascular Disease

## 2024-01-03 VITALS — BP 120/78 | HR 74 | Ht 64.0 in | Wt 154.6 lb

## 2024-01-03 DIAGNOSIS — I10 Essential (primary) hypertension: Secondary | ICD-10-CM | POA: Diagnosis present

## 2024-01-03 DIAGNOSIS — I447 Left bundle-branch block, unspecified: Secondary | ICD-10-CM | POA: Diagnosis present

## 2024-01-03 DIAGNOSIS — E785 Hyperlipidemia, unspecified: Secondary | ICD-10-CM | POA: Insufficient documentation

## 2024-01-03 NOTE — Patient Instructions (Signed)
Testing/Procedures: Your physician has requested that you have a coronary calcium score performed. This is not covered by insurance and will be an out-of-pocket cost of approximately $99.    Follow-Up: At [facility], you and your health needs are our priority.  As part of our continuing mission to provide you with exceptional heart care, we have created designated Provider Care Teams.  These Care Teams include your primary Cardiologist (physician) and Advanced Practice Providers (APPs -  Physician Assistants and Nurse Practitioners) who all work together to provide you with the care you need, when you need it.  We recommend signing up for the patient portal called "MyChart".  Sign up information is provided on this After Visit Summary.  MyChart is used to connect with patients for Virtual Visits (Telemedicine).  Patients are able to view lab/test results, encounter notes, upcoming appointments, etc.  Non-urgent messages can be sent to your provider as well.   To learn more about what you can do with MyChart, go to [email or web address].    Your next appointment:   1 year(s)  Provider:   Tessa Lerner, DO  Other Instructions   1st Floor: - Lobby - Registration  - Pharmacy  - Lab - Cafe  2nd Floor: - PV Lab - Diagnostic Testing (echo, CT, nuclear med)  3rd Floor: - Vacant  4th Floor: - TCTS (cardiothoracic surgery) - AFib Clinic - Structural Heart Clinic - Vascular Surgery  - Vascular Ultrasound  5th Floor: - HeartCare Cardiology (general and EP) - Clinical Pharmacy for coumadin, hypertension, lipid, weight-loss medications, and med management appointments    Valet parking services will be available as well.

## 2024-01-06 ENCOUNTER — Other Ambulatory Visit: Payer: Self-pay | Admitting: Cardiovascular Disease

## 2024-01-06 ENCOUNTER — Telehealth: Payer: Self-pay | Admitting: Cardiovascular Disease

## 2024-01-06 MED ORDER — HYDROCHLOROTHIAZIDE 25 MG PO TABS: 25.0000 mg | ORAL_TABLET | Freq: Every day | ORAL | 3 refills | Status: AC

## 2024-01-06 MED ORDER — CARVEDILOL 12.5 MG PO TABS: 12.5000 mg | ORAL_TABLET | Freq: Two times a day (BID) | ORAL | 3 refills | Status: DC

## 2024-01-06 NOTE — Telephone Encounter (Signed)
*  STAT* If patient is at the pharmacy, call can be transferred to refill team.   1. Which medications need to be refilled? (please list name of each medication and dose if known)   carvedilol (COREG) 12.5 MG tablet   2. Would you like to learn more about the convenience, safety, & potential cost savings by using the [facility]?   3. Are you open to using the [facility] (Type [facility]. ).  4. Which pharmacy/location (including street and city if local pharmacy) is medication to be sent to?  [id number] [place], [place] - [address]   5. Do they need a 30 day or 90 day supply?   90 day  Patient stated she will be out of this medication on Wednesday (2/5).

## 2024-01-07 MED ORDER — CARVEDILOL 12.5 MG PO TABS: 12.5000 mg | ORAL_TABLET | Freq: Two times a day (BID) | ORAL | 3 refills | Status: AC

## 2024-01-07 NOTE — Addendum Note (Signed)
Addended by: Adriana Simas, Desjuan Stearns L on: 01/07/2024 02:29 PM   Modules accepted: Orders

## 2024-01-10 ENCOUNTER — Ambulatory Visit (HOSPITAL_COMMUNITY)
Admission: RE | Admit: 2024-01-10 | Discharge: 2024-01-10 | Disposition: A | Discharge status: Home / Self Care | Payer: Self-pay | Source: Ambulatory Visit | Attending: Cardiovascular Disease | Admitting: Cardiovascular Disease

## 2024-01-10 DIAGNOSIS — I10 Essential (primary) hypertension: Secondary | ICD-10-CM | POA: Insufficient documentation

## 2024-01-12 ENCOUNTER — Encounter: Payer: Self-pay | Admitting: Cardiovascular Disease

## 2024-01-16 ENCOUNTER — Other Ambulatory Visit: Payer: Self-pay | Admitting: *Deleted

## 2024-01-16 ENCOUNTER — Other Ambulatory Visit (HOSPITAL_COMMUNITY): Payer: Self-pay | Admitting: *Deleted

## 2024-01-16 DIAGNOSIS — E7800 Pure hypercholesterolemia, unspecified: Secondary | ICD-10-CM

## 2024-01-16 DIAGNOSIS — Z79899 Other long term (current) drug therapy: Secondary | ICD-10-CM

## 2024-01-16 MED ORDER — EZETIMIBE 10 MG PO TABS: 10.0000 mg | ORAL_TABLET | Freq: Every day | ORAL | 3 refills | Status: DC

## 2024-04-01 ENCOUNTER — Ambulatory Visit (INDEPENDENT_AMBULATORY_CARE_PROVIDER_SITE_OTHER): Payer: Medicare Other

## 2024-04-01 ENCOUNTER — Encounter (INDEPENDENT_AMBULATORY_CARE_PROVIDER_SITE_OTHER): Payer: Self-pay

## 2024-04-01 VITALS — BP 169/83 | HR 70 | Ht 64.0 in | Wt 152.0 lb

## 2024-04-01 DIAGNOSIS — R42 Dizziness and giddiness: Secondary | ICD-10-CM | POA: Diagnosis not present

## 2024-04-01 DIAGNOSIS — H903 Sensorineural hearing loss, bilateral: Secondary | ICD-10-CM

## 2024-04-01 DIAGNOSIS — H9313 Tinnitus, bilateral: Secondary | ICD-10-CM

## 2024-04-01 DIAGNOSIS — H6121 Impacted cerumen, right ear: Secondary | ICD-10-CM

## 2024-04-01 NOTE — Progress Notes (Signed)
 Patient ID: Elizabeth Armstrong, female   DOB: 1956-06-07, 68 y.o.   MRN: [id number]  Follow-up: Recurrent dizziness, hearing loss, tinnitus  HPI: The patient is a 68 year old female who returns today for her follow-up evaluation.  The patient was previously seen for recurrent dizziness, hearing loss, and tinnitus.  She was last seen 1 year ago.  At that time, her dizziness was under controlled.  She was noted to have bilateral high-frequency hearing loss, likely secondary to presbycusis.  Her tinnitus was likely a result of the hearing loss.  The patient returns today reporting no more dizziness.  She continues to have bilateral high-pitched tinnitus.  She has not noted any significant change in her hearing.  Currently she denies any otalgia, otorrhea, or vertigo.  Exam: General: Communicates without difficulty, well nourished, no acute distress. Head: Normocephalic, no evidence injury, no tenderness, facial buttresses intact without stepoff. Face/sinus: No tenderness to palpation and percussion. Facial movement is normal and symmetric. Eyes: PERRL, EOMI. No scleral icterus, conjunctivae clear. Neuro: CN II exam reveals vision grossly intact.  No nystagmus at any point of gaze. Ears: Auricles well formed without lesions.  Right ear cerumen impaction.  The left ear canal and tympanic membrane are normal.  Nose: External evaluation reveals normal support and skin without lesions.  Dorsum is intact.  Anterior rhinoscopy reveals pink mucosa over anterior aspect of inferior turbinates and intact septum.  No purulence noted. Oral:  Oral cavity and oropharynx are intact, symmetric, without erythema or edema.  Mucosa is moist without lesions. Neck: Full range of motion without pain.  There is no significant lymphadenopathy.  No masses palpable.  Thyroid  bed within normal limits to palpation.  Parotid glands and submandibular glands equal bilaterally without mass.  Trachea is midline. Neuro:  CN 2-12 grossly intact.  Gait normal. Vestibular: No nystagmus at any point of gaze. Dix Hallpike negative. Vestibular: There is no nystagmus with pneumatic pressure on either tympanic membrane or Valsalva. The cerebellar examination is unremarkable.   Procedure: Right ear cerumen disimpaction Anesthesia: None Description: Under the operating microscope, the cerumen is carefully removed with a combination of cerumen currette, alligator forceps, and suction catheters.  After the cerumen is removed, the TMs are noted to be normal.  No mass, erythema, or lesions. The patient tolerated the procedure well.   Assessment: 1.  Right ear cerumen impaction.  After the disimpaction procedure, both tympanic membranes and middle ear spaces are noted to be normal. 2.  The patient's dizziness is currently under control.  She has not been symptomatic over the past year. 3.  Stable bilateral high-frequency sensorineural hearing loss, likely secondary to routine presbycusis. 4.  The patient's tinnitus is likely a direct result of the hearing loss.  Plan:  1.  Otomicroscopy with right ear cerumen disimpaction. 2.  The physical exam findings and the hearing test results are reviewed with the patient. 3.  The strategies to cope with tinnitus, including the use of masker, hearing aids, tinnitus retraining therapy, and avoidance of caffeine and alcohol are discussed.  4.  The patient will return for reevaluation in 1 year.

## 2024-04-02 DIAGNOSIS — H6121 Impacted cerumen, right ear: Secondary | ICD-10-CM | POA: Insufficient documentation

## 2024-04-02 DIAGNOSIS — H9313 Tinnitus, bilateral: Secondary | ICD-10-CM | POA: Insufficient documentation

## 2024-04-02 DIAGNOSIS — R42 Dizziness and giddiness: Secondary | ICD-10-CM | POA: Insufficient documentation

## 2024-04-02 DIAGNOSIS — H903 Sensorineural hearing loss, bilateral: Secondary | ICD-10-CM | POA: Insufficient documentation

## 2024-06-15 LAB — HM MAMMOGRAPHY

## 2024-06-19 ENCOUNTER — Ambulatory Visit: Payer: Self-pay | Admitting: Family Medicine

## 2024-09-30 LAB — LIPID PANEL

## 2024-10-01 LAB — LIPID PANEL
Cholesterol, Total: 219 mg/dL — AB (ref 100–199)
HDL: 111 mg/dL (ref >39)
LDL CALC COMMENT:: 2 ratio (ref 0.0–4.4)
LDL Chol Calc (NIH): 96 mg/dL (ref 0–99)
Triglycerides: 69 mg/dL (ref 0–149)
VLDL Cholesterol Cal: 12 mg/dL (ref 5–40)

## 2024-10-01 LAB — ALT: ALT: 15 IU/L (ref 0–32)

## 2024-10-05 ENCOUNTER — Ambulatory Visit: Payer: Self-pay | Admitting: Cardiology

## 2024-10-05 DIAGNOSIS — I10 Essential (primary) hypertension: Secondary | ICD-10-CM

## 2024-10-05 DIAGNOSIS — E7800 Pure hypercholesterolemia, unspecified: Secondary | ICD-10-CM

## 2024-10-12 ENCOUNTER — Ambulatory Visit (INDEPENDENT_AMBULATORY_CARE_PROVIDER_SITE_OTHER): Admitting: Family Medicine

## 2024-10-12 VITALS — BP 160/100 | HR 65 | Temp 97.4°F | Ht 64.0 in | Wt 155.2 lb

## 2024-10-12 DIAGNOSIS — J0110 Acute frontal sinusitis, unspecified: Secondary | ICD-10-CM | POA: Diagnosis not present

## 2024-10-12 DIAGNOSIS — J029 Acute pharyngitis, unspecified: Secondary | ICD-10-CM | POA: Diagnosis not present

## 2024-10-12 LAB — POCT RAPID STREP A (OFFICE): Rapid Strep A Screen: NEGATIVE

## 2024-10-12 MED ORDER — AMOXICILLIN-POT CLAVULANATE 875-125 MG PO TABS: 1.0000 | ORAL_TABLET | Freq: Two times a day (BID) | ORAL | 0 refills | Status: AC

## 2024-10-12 NOTE — Progress Notes (Signed)
 Established Patient Office Visit  Subjective   Patient ID: Elizabeth Armstrong, female    DOB: 11-07-1956  Age: 68 y.o. MRN: [id number]  Chief Complaint  Patient presents with   Cough    Non-productive x8 days, tried Mucinex with no relief   Sore Throat    X8 days, worse past 4 days    Cough Associated symptoms include headaches. Pertinent negatives include no chest pain, chills, fever or shortness of breath.  Sore Throat  Associated symptoms include coughing and headaches. Pertinent negatives include no shortness of breath.   8 day history of URI symptoms, states that it started out as a head cold and sore throat, lots of drainage but not real nasal congestion, maybe some headaches in the frontal area. No ear pain or fullness. States initially Elizabeth Armstrong got better but now the sore throat has worsened and Elizabeth Armstrong is still coughing. Elizabeth Armstrong did not test herself for COVID, did have her booster however.    Current Outpatient Medications  Medication Instructions   amoxicillin -clavulanate (AUGMENTIN) 875-125 MG tablet 1 tablet, Oral, 2 times daily   carvedilol  (COREG ) 12.5 mg, Oral, 2 times daily   Cholecalciferol (VITAMIN D3) POWD Take by mouth.   Cyanocobalamin  (VITAMIN B-12 PO) Take by mouth.   ezetimibe  (ZETIA ) 10 mg, Oral, Daily   hydrochlorothiazide  (HYDRODIURIL ) 25 mg, Oral, Daily   meloxicam  (MOBIC ) 7.5 mg, Oral, Daily PRN   potassium chloride  (KLOR-CON ) 10 MEQ tablet 10 mEq, Oral, Daily, Please call [phone] to schedule an appointment with Dr. Alveta for future refills. Thank you.    Patient Active Problem List   Diagnosis Date Noted   Impacted cerumen of right ear 04/02/2024   Dizziness 04/02/2024   Tinnitus of both ears 04/02/2024   Sensorineural hearing loss, bilateral 04/02/2024   LBBB (left bundle branch block) 07/05/2021   Nonspecific abnormal electrocardiogram (ECG) (EKG) 12/06/2020   Overweight (BMI 25.0-29.9) 06/15/2020   Osteoarthritis of right hip 06/14/2020    Status post right hip replacement 06/14/2020   B12 deficiency 04/17/2019   Osteoporosis 03/12/2017   Melanoma of skin (HCC) 08/25/2015   Palpitations 08/25/2015   Vaginal atrophy 08/25/2015   Essential hypertension 11/19/2014   Pure hypercholesterolemia 11/19/2014   Osteoarthritis 11/19/2014     Review of Systems  Constitutional:  Negative for chills and fever.  Respiratory:  Positive for cough. Negative for shortness of breath.   Cardiovascular:  Negative for chest pain.  Neurological:  Positive for headaches.  All other systems reviewed and are negative.     Objective:     BP (!) 160/100   Pulse 65   Temp (!) 97.4 F (36.3 C) (Oral)   Ht 5' 4 (1.626 m)   Wt 155 lb 3.2 oz (70.4 kg)   SpO2 100%   BMI 26.64 kg/m    Physical Exam Vitals reviewed.  Constitutional:      Appearance: Elizabeth Armstrong is well-developed and normal weight.  HENT:     Right Ear: Tympanic membrane normal.     Left Ear: Tympanic membrane normal.     Mouth/Throat:     Mouth: Mucous membranes are moist.     Pharynx: Posterior oropharyngeal erythema present. No oropharyngeal exudate.  Eyes:     Conjunctiva/sclera: Conjunctivae normal.  Cardiovascular:     Rate and Rhythm: Normal rate and regular rhythm.     Heart sounds: Murmur heard.  Pulmonary:     Effort: Pulmonary effort is normal.     Breath sounds: No wheezing or rhonchi.  Abdominal:     Palpations: Abdomen is soft.  Musculoskeletal:     Cervical back: Normal range of motion.  Neurological:     Mental Status: Elizabeth Armstrong is alert.      Results for orders placed or performed in visit on 10/12/24  POC Rapid Strep A  Result Value Ref Range   Rapid Strep A Screen Negative Negative      The ASCVD Risk score (Arnett DK, et al., 2019) failed to calculate for the following reasons:   The valid HDL cholesterol range is 20 to 100 mg/dL    Assessment & Plan:  Sore throat -     POCT rapid strep A  Acute non-recurrent frontal sinusitis -      Amoxicillin -Pot Clavulanate; Take 1 tablet by mouth 2 (two) times daily for 7 days.  Dispense: 14 tablet; Refill: 0   Strep is negative however pt's symptoms have not improved and Elizabeth Armstrong is over 1 week out from the start of her symptoms, with the acute worsening after Elizabeth Armstrong thought Elizabeth Armstrong was getting better I am concerned about secondary bacterial infection, possibly in the sinuses since Elizabeth Armstrong is having frontal headaches. I will treat empirically with augmentin for 7 days. Pt's BP is also elevated, Elizabeth Armstrong will be seeing me next month for her AWV, I advised that Elizabeth Armstrong get a BP cuff that is working to start checking her BP at home -- if Elizabeth Armstrong remains elevated then we will need to adjust her BP medication at the next visit.  Return in about 5 weeks (around 11/19/2024) for AWV -- with me in the office.    Heron CHRISTELLA Sharper, MD

## 2024-12-15 LAB — COMPREHENSIVE METABOLIC PANEL WITH GFR
ALT: 17 IU/L (ref 0–32)
AST: 19 IU/L (ref 0–40)
Albumin: 4.7 g/dL (ref 3.9–4.9)
Alkaline Phosphatase: 79 IU/L (ref 49–135)
BUN/Creatinine Ratio: 17 (ref 12–28)
BUN: 14 mg/dL (ref 8–27)
Bilirubin Total: 0.6 mg/dL (ref 0.0–1.2)
CO2: 25 mmol/L (ref 20–29)
Calcium: 10.4 mg/dL — ABNORMAL HIGH (ref 8.7–10.3)
Chloride: 93 mmol/L — ABNORMAL LOW (ref 96–106)
Creatinine, Ser: 0.82 mg/dL (ref 0.57–1.00)
Globulin, Total: 2.3 g/dL (ref 1.5–4.5)
Glucose: 112 mg/dL — ABNORMAL HIGH (ref 70–99)
Potassium: 4.4 mmol/L (ref 3.5–5.2)
Sodium: 135 mmol/L (ref 134–144)
Total Protein: 7 g/dL (ref 6.0–8.5)
eGFR: 77 mL/min/1.73

## 2024-12-15 LAB — LIPID PANEL
Chol/HDL Ratio: 1.9 ratio (ref 0.0–4.4)
Cholesterol, Total: 232 mg/dL — ABNORMAL HIGH (ref 100–199)
HDL: 125 mg/dL
LDL Chol Calc (NIH): 96 mg/dL (ref 0–99)
Triglycerides: 62 mg/dL (ref 0–149)
VLDL Cholesterol Cal: 11 mg/dL (ref 5–40)

## 2024-12-16 ENCOUNTER — Other Ambulatory Visit (HOSPITAL_COMMUNITY): Payer: Self-pay

## 2024-12-16 ENCOUNTER — Ambulatory Visit: Attending: Cardiology | Admitting: Cardiology

## 2024-12-16 ENCOUNTER — Encounter: Payer: Self-pay | Admitting: Cardiology

## 2024-12-16 VITALS — BP 124/80 | HR 67 | Resp 16 | Ht 64.0 in | Wt 158.2 lb

## 2024-12-16 DIAGNOSIS — R931 Abnormal findings on diagnostic imaging of heart and coronary circulation: Secondary | ICD-10-CM | POA: Insufficient documentation

## 2024-12-16 DIAGNOSIS — R9431 Abnormal electrocardiogram [ECG] [EKG]: Secondary | ICD-10-CM | POA: Diagnosis not present

## 2024-12-16 DIAGNOSIS — R6889 Other general symptoms and signs: Secondary | ICD-10-CM | POA: Insufficient documentation

## 2024-12-16 DIAGNOSIS — I447 Left bundle-branch block, unspecified: Secondary | ICD-10-CM | POA: Insufficient documentation

## 2024-12-16 DIAGNOSIS — I10 Essential (primary) hypertension: Secondary | ICD-10-CM | POA: Diagnosis not present

## 2024-12-16 DIAGNOSIS — E7800 Pure hypercholesterolemia, unspecified: Secondary | ICD-10-CM | POA: Diagnosis not present

## 2024-12-16 MED ORDER — METOPROLOL TARTRATE 50 MG PO TABS
50.0000 mg | ORAL_TABLET | ORAL | 0 refills | Status: AC
  Filled 2024-12-16: qty 1, 1d supply, fill #0

## 2024-12-16 NOTE — Progress Notes (Signed)
 " Cardiology Office Note:  .   Date:  12/16/2024  ID:  Elizabeth Armstrong, DOB [birth year], MRN [id number] PCP:  Ozell Heron HERO, MD  Former Cardiology Providers: Dr. Aleene Passe [facility] Providers Cardiologist:  Madonna Large, DO , [facility] (established care 12/16/2024) Electrophysiologist:  None  Click to update primary MD,subspecialty MD or APP then REFRESH:1}    Chief Complaint  Patient presents with   Follow-up    1 year - CAC and HLD    History of Present Illness: .   Elizabeth Armstrong is a 69 y.o. Caucasian female whose past medical history and cardiovascular risk factors includes: Left bundle branch block hypertension, coronary artery calcification, hyperlipidemia.  Formally under the care of Dr. Aleene Passe  who last saw Elizabeth Armstrong [facility] back in January 2025. I am seeing her for the first time to re-establishing care.   In February 2025 patient had a coronary calcium  score in the care of Dr. Passe and he recommended lipid-lowering agents to further improve her lipid profile given the presence of mild CAC.  Patient was willing to try Zetia  10 mg p.o. daily and she had repeat blood work in October 2025 which reported improvement in LDL from around 13 mg/dL to 96 mg/dL.  She presents today for 1 year follow-up visit  Since last office visit Laurynn denies any anginal chest pain or heart failure symptoms.   No hospitalizations or urgent care visits for cardiovascular reasons.   She has been compliant with her medical therapy and endorses no concerns.  Patient has noted decrease in overall physical endurance.  Still enjoys golfing 3-4 times per week when possible and back pain is limiting her ability to walk.  Denies anginal chest pain but does have shortness of breath with effort related activities.  Review of Systems: .   Review of Systems  Cardiovascular:  Positive for dyspnea on exertion. Negative for chest pain, claudication, irregular heartbeat, leg  swelling, near-syncope, orthopnea, palpitations, paroxysmal nocturnal dyspnea and syncope.  Respiratory:  Negative for shortness of breath.   Hematologic/Lymphatic: Negative for bleeding problem.    Studies Reviewed:   EKG: EKG Interpretation Date/Time:  Wednesday December 16 2024 09:01:15 EST Ventricular Rate:  66 PR Interval:  170 QRS Duration:  122 QT Interval:  476 QTC Calculation: 499 R Axis:   53  Text Interpretation: Normal sinus rhythm Non-specific intra-ventricular conduction delay Septal infarct (cited on or before 03-Jan-2024) T wave abnormality, consider inferior ischemia When compared with ECG of 03-Jan-2024 08:33, No significant change was found Confirmed by Large Madonna [phone]) on 12/16/2024 9:22:45 AM  Echocardiogram: January 2022: LVEF 63%, grade 1 diastolic dysfunction, right ventricular size and function normal, no significant valvular heart disease.  Stress Testing: August 2022: Low risk study  Cardiac calcium  score: February 2025 Total CAC 62, 68 percentile Aortic atherosclerosis Ascending aorta 38 mm Radiology over read: No significant extracardiac findings  RADIOLOGY: N/A  Risk Assessment/Calculations:   NA  Labs:       Latest Ref Rng & Units 03/30/2022   11:43 AM 10/08/2021    1:20 PM 07/15/2020    8:44 AM  CBC  WBC 4.0 - 10.5 K/uL 5.4  6.3  4.8   Hemoglobin 12.0 - 15.0 g/dL 86.5  86.3  87.4   Hematocrit 36.0 - 46.0 % 39.4  39.4  37.2   Platelets 150.0 - 400.0 K/uL 265.0  294  260        Latest Ref Rng & Units 12/14/2024  8:31 AM 04/01/2023    8:07 AM 03/30/2022   11:43 AM  BMP  Glucose 70 - 99 mg/dL 887  92  897   BUN 8 - 27 mg/dL 14  13  12    Creatinine 0.57 - 1.00 mg/dL 9.17  9.25  9.27   BUN/Creat Ratio 12 - 28 17     Sodium 134 - 144 mmol/L 135  131  133   Potassium 3.5 - 5.2 mmol/L 4.4  4.3  4.1   Chloride 96 - 106 mmol/L 93  96  96   CO2 20 - 29 mmol/L 25  27  30    Calcium  8.7 - 10.3 mg/dL 89.5  9.7  9.8       Latest Ref  Rng & Units 12/14/2024    8:31 AM 09/30/2024    9:11 AM 04/01/2023    8:07 AM  CMP  Glucose 70 - 99 mg/dL 887   92   BUN 8 - 27 mg/dL 14   13   Creatinine 9.42 - 1.00 mg/dL 9.17   9.25   Sodium 865 - 144 mmol/L 135   131   Potassium 3.5 - 5.2 mmol/L 4.4   4.3   Chloride 96 - 106 mmol/L 93   96   CO2 20 - 29 mmol/L 25   27   Calcium  8.7 - 10.3 mg/dL 89.5   9.7   Total Protein 6.0 - 8.5 g/dL 7.0   6.7   Total Bilirubin 0.0 - 1.2 mg/dL 0.6   0.6   Alkaline Phos 49 - 135 IU/L 79   59   AST 0 - 40 IU/L 19   19   ALT 0 - 32 IU/L 17  15  14      Lab Results  Component Value Date   CHOL 232 (H) 12/14/2024   HDL 125 12/14/2024   LDLCALC 96 12/14/2024   TRIG 62 12/14/2024   CHOLHDL 1.9 12/14/2024   No results for input(s): LIPOA in the last 8760 hours. No components found for: NTPROBNP No results for input(s): PROBNP in the last 8760 hours. No results for input(s): TSH in the last 8760 hours.  Physical Exam:    Today's Vitals   12/16/24 0859  BP: 124/80  Pulse: 67  Resp: 16  SpO2: 97%  Weight: 158 lb 3.2 oz (71.8 kg)  Height: 5' 4 (1.626 m)   Body mass index is 27.15 kg/m. Wt Readings from Last 3 Encounters:  12/16/24 158 lb 3.2 oz (71.8 kg)  10/12/24 155 lb 3.2 oz (70.4 kg)  04/01/24 152 lb (68.9 kg)    Physical Exam  Constitutional: No distress.  hemodynamically stable  Neck: No JVD present.  Cardiovascular: Normal rate, regular rhythm, S1 normal and S2 normal. Exam reveals no gallop, no S3 and no S4.  No murmur heard. Pulmonary/Chest: Effort normal and breath sounds normal. No stridor. She has no wheezes. She has no rales.  Musculoskeletal:        General: No edema.     Cervical back: Neck supple.  Skin: Skin is warm.     Impression & Recommendation(s):  Impression:   ICD-10-CM   1. Agatston coronary artery calcium  score less than 100  R93.1 EKG 12-Lead    metoprolol  tartrate (LOPRESSOR ) 50 MG tablet    ECHOCARDIOGRAM COMPLETE    CT CORONARY MORPH  W/CTA COR W/SCORE W/CA W/CM &/OR WO/CM    2. Worsening functional endurance  R68.89 metoprolol  tartrate (LOPRESSOR ) 50 MG tablet  ECHOCARDIOGRAM COMPLETE    CT CORONARY MORPH W/CTA COR W/SCORE W/CA W/CM &/OR WO/CM    3. Nonspecific abnormal electrocardiogram (ECG) (EKG)  R94.31 metoprolol  tartrate (LOPRESSOR ) 50 MG tablet    ECHOCARDIOGRAM COMPLETE    CT CORONARY MORPH W/CTA COR W/SCORE W/CA W/CM &/OR WO/CM    4. Pure hypercholesterolemia  E78.00     5. Essential hypertension  I10     6. LBBB (left bundle branch block)  I44.7        Recommendation(s):     Agatston coronary artery calcium  score less than 100 Worsening functional endurance Nonspecific abnormal electrocardiogram (ECG) (EKG) EKG shows subtle STT changes in inferolateral leads. No anginal pain or heart failure symptoms, but shortness of breath with exertion.  - Ordered echocardiogram and coronary CTA. - Take Lopressor  50 mg PO two hours before coronary CTA. - Instructed to withhold carvedilol  on the day of coronary CTA. - Resume carvedilol  the following day. - Scheduled follow-up in one year unless results are abnormal.  Essential hypertension Hypertension well controlled on hydrochlorothiazide . Potassium levels normal without supplements (patient states she ran out of Klor-Con  for several weeks and the labs done on 12/15/2023 reflect this change). - Continue hydrochlorothiazide  25 mg PO daily. - Discontinued potassium supplements.  Mixed hypercholesterolemia Was on Lipitor but she felt drained. LDL improved from 113 to 96mg /dL HDL is 125mg /dL, TAG 62, and Total cholesterol 232mg /dL as of 98/7973 - labs independently reviewed.  - Continue Zetia  10 mg PO daily until coronary CTA results are available.  Left bundle branch block Well-managed, no new symptoms. - Continue monitoring.     Orders Placed:  Orders Placed This Encounter  Procedures   CT CORONARY MORPH W/CTA COR W/SCORE W/CA W/CM &/OR WO/CM     Standing Status:   Future    Expected Date:   01/16/2025    Expiration Date:   12/16/2025    If indicated for the ordered procedure, I authorize the administration of contrast media per Radiology protocol:   Yes    Does the patient have a contrast media/X-ray dye allergy?:   No    [facility]    Authorization::   Dr. Michele for Abnormal EKG, BMI 27   EKG 12-Lead   ECHOCARDIOGRAM COMPLETE    Standing Status:   Future    Expected Date:   01/16/2025    Expiration Date:   12/16/2025    Where should this test be performed[facility]    Does the patient weigh less than or greater than 250 lbs?:   Patient weighs less than 250 lbs    Perflutren DEFINITY (image enhancing agent) should be administered unless hypersensitivity or allergy exist:   Administer Perflutren    Reason for exam-Echo:   Abnormal ECG  R94.31     Final Medication List:    Meds ordered this encounter  Medications   metoprolol  tartrate (LOPRESSOR ) 50 MG tablet    Sig: Take one (1) 50 mg tablet two (2) hours prior to scan.    Dispense:  1 tablet    Refill:  0    Medications Discontinued During This Encounter  Medication Reason   potassium chloride  (KLOR-CON ) 10 MEQ tablet Discontinued by provider    Current Medications[1]  Consent:   NA  Disposition:   1 year sooner based on CCTA results.   Her questions and concerns were addressed to her satisfaction. She voices understanding of the recommendations  provided during this encounter.    Signed, Madonna Michele HAS, [facility]  A Division of [facility] [address] [place], [place] [address]  12/16/2024 5:51 PM     [1]  Current Outpatient Medications:    carvedilol  (COREG ) 12.5 MG tablet, Take 1 tablet (12.5 mg total) by mouth 2 (two) times daily., Disp: 180 tablet, Rfl: 3   Cholecalciferol (VITAMIN D3) POWD, Take by mouth., Disp: , Rfl:    Cyanocobalamin  (VITAMIN B-12 PO),  Take by mouth., Disp: , Rfl:    ezetimibe  (ZETIA ) 10 MG tablet, Take 1 tablet (10 mg total) by mouth daily., Disp: 90 tablet, Rfl: 3   hydrochlorothiazide  (HYDRODIURIL ) 25 MG tablet, Take 1 tablet (25 mg total) by mouth daily., Disp: 90 tablet, Rfl: 3   meloxicam  (MOBIC ) 7.5 MG tablet, Take 1 tablet (7.5 mg total) by mouth daily as needed for pain., Disp: 30 tablet, Rfl: 0   metoprolol  tartrate (LOPRESSOR ) 50 MG tablet, Take one (1) 50 mg tablet two (2) hours prior to scan., Disp: 1 tablet, Rfl: 0  "

## 2024-12-16 NOTE — Patient Instructions (Addendum)
 Medication Instructions:  STOP taking Potassium *If you need a refill on your cardiac medications before your next appointment, please call your pharmacy*  Lab Work: None ordered If you have labs (blood work) drawn today and your tests are completely normal, you will receive your results only by: MyChart Message (if you have MyChart) OR A paper copy in the mail If you have any lab test that is abnormal or we need to change your treatment, we will call you to review the results.  Testing/Procedures: Echocardiogram  Coronary CT Angiography  Follow-Up: At [facility], you and your health needs are our priority.  As part of our continuing mission to provide you with exceptional heart care, our providers are all part of one team.  This team includes your primary Cardiologist (physician) and Advanced Practice Providers or APPs (Physician Assistants and Nurse Practitioners) who all work together to provide you with the care you need, when you need it.  Your next appointment:   1 year(s)  Provider:   Madonna Large, DO    We recommend signing up for the patient portal called MyChart.  Sign up information is provided on this After Visit Summary.  MyChart is used to connect with patients for Virtual Visits (Telemedicine).  Patients are able to view lab/test results, encounter notes, upcoming appointments, etc.  Non-urgent messages can be sent to your provider as well.   To learn more about what you can do with MyChart, go to [email or web address].   Other Instructions Your physician has requested that you have an echocardiogram. Echocardiography is a painless test that uses sound waves to create images of your heart. It provides your doctor with information about the size and shape of your heart and how well your hearts chambers and valves are working. This procedure takes approximately one hour. There are no restrictions for this procedure. Please do NOT wear cologne, perfume,  aftershave, or lotions (deodorant is allowed). Please arrive 15 minutes prior to your appointment time.  Please note: We ask at that you not bring children with you during ultrasound (echo/ vascular) testing. Due to room size and safety concerns, children are not allowed in the ultrasound rooms during exams. Our front office staff cannot provide observation of children in our lobby area while testing is being conducted. An adult accompanying a patient to their appointment will only be allowed in the ultrasound room at the discretion of the ultrasound technician under special circumstances. We apologize for any inconvenience.     Your cardiac CT will be scheduled at   [facility] [address]  [place], [place] [address]  At the [facility] at [address], please enter the parking lot using the [address] entrance and use the FREE valet service at the patient drop-off area. Enter the building and check-in with registration on the main floor.  Please follow these instructions carefully (unless otherwise directed):  An IV will be required for this test and Nitroglycerin will be given.   On the Night Before the Test: Be sure to Drink plenty of water. Do not consume any caffeinated/decaffeinated beverages or chocolate 12 hours prior to your test. Do not take any antihistamines 12 hours prior to your test.  On the Day of the Test: Drink plenty of water until 1 hour prior to the test. Do not eat any food 1 hour prior to test. You may take your regular medications prior to the test.  Take 50 mg metoprolol   tartrate (Lopressor ) two hours prior to test. Please HOLD Hydrochlorothiazide  and Carvedilol  on the morning of the test. FEMALES- please wear underwire-free bra if available, avoid dresses & tight clothing  After the Test: Drink plenty of water. After receiving IV contrast, you may experience a mild flushed feeling. This is normal. On  occasion, you may experience a mild rash up to 24 hours after the test. This is not dangerous. If this occurs, you can take Benadryl  25 mg, Zyrtec, Claritin, or Allegra and increase your fluid intake. (Patients taking Tikosyn should avoid Benadryl , and may take Zyrtec, Claritin, or Allegra) If you experience trouble breathing, this can be serious. If it is severe call 911 IMMEDIATELY. If it is mild, please call our office.  We will call to schedule your test 2-4 weeks out understanding that some insurance companies will need an authorization prior to the service being performed.   For more information and frequently asked questions, please visit our website : [email or web address]  For non-scheduling related questions, please contact the cardiac imaging nurse navigator should you have any questions/concerns: Cardiac Imaging Nurse Navigators Direct Office Dial: [phone]   For scheduling needs, including cancellations and rescheduling, please call Brittany, [phone].

## 2024-12-18 ENCOUNTER — Encounter: Payer: Self-pay | Admitting: Cardiology

## 2024-12-21 ENCOUNTER — Other Ambulatory Visit: Payer: Self-pay | Admitting: Cardiology

## 2024-12-23 ENCOUNTER — Ambulatory Visit (INDEPENDENT_AMBULATORY_CARE_PROVIDER_SITE_OTHER)

## 2024-12-23 VITALS — Ht 64.0 in | Wt 158.0 lb

## 2024-12-23 DIAGNOSIS — Z0000 Encounter for general adult medical examination without abnormal findings: Secondary | ICD-10-CM

## 2024-12-23 NOTE — Patient Instructions (Addendum)
 Elizabeth Armstrong,  Thank you for taking the time for your Medicare Wellness Visit. I appreciate your continued commitment to your health goals. Please review the care plan we discussed, and feel free to reach out if I can assist you further.  Please note that Annual Wellness Visits do not include a physical exam. Some assessments may be limited, especially if the visit was conducted virtually. If needed, we may recommend an in-person follow-up with your provider.  Ongoing Care Seeing your primary care provider every 3 to 6 months helps us  monitor your health and provide consistent, personalized care.   Referrals If a referral was made during today's visit and you haven't received any updates within two weeks, please contact the referred provider directly to check on the status.  Recommended Screenings:  Health Maintenance  Topic Date Due   Flu Shot  07/03/2024   COVID-19 Vaccine (8 - 2025-26 season) 08/03/2024   Medicare Annual Wellness Visit  12/23/2025   Breast Cancer Screening  06/15/2026   Colon Cancer Screening  08/15/2026   DTaP/Tdap/Td vaccine (3 - Td or Tdap) 04/12/2028   Pneumococcal Vaccine for age over 49  Completed   Osteoporosis screening with Bone Density Scan  Completed   Hepatitis C Screening  Completed   Zoster (Shingles) Vaccine  Completed   Meningitis B Vaccine  Aged Out       12/23/2024    8:39 AM  Advanced Directives  Does Patient Have a Medical Advance Directive? Yes  Type of Estate Agent of [place];Living will  Does patient want to make changes to medical advance directive? No - Patient declined  Copy of Healthcare Power of Attorney in Chart? No - copy requested    Vision: Annual vision screenings are recommended for early detection of glaucoma, cataracts, and diabetic retinopathy. These exams can also reveal signs of chronic conditions such as diabetes and high blood pressure.  Dental: Annual dental screenings help detect early signs  of oral cancer, gum disease, and other conditions linked to overall health, including heart disease and diabetes.  Please see the attached documents for additional preventive care recommendations.

## 2024-12-23 NOTE — Progress Notes (Signed)
 "  Chief Complaint  Patient presents with   Medicare Wellness     Subjective:   Elizabeth Armstrong is a 69 y.o. female who presents for a Medicare Annual Wellness Visit.  Visit info / Clinical Intake: Medicare Wellness Visit Type:: Subsequent Annual Wellness Visit Persons participating in visit and providing information:: patient Medicare Wellness Visit Mode:: Video Since this visit was completed virtually, some vitals may be partially provided or unavailable. Missing vitals are due to the limitations of the virtual format.: Documented vitals are patient reported If Telephone or Video please confirm:: I connected with patient using audio/video enable telemedicine. I verified patient identity with two identifiers, discussed telehealth limitations, and patient agreed to proceed. Patient Location:: Home Provider Location:: Office Interpreter Needed?: No Pre-visit prep was completed: yes AWV questionnaire completed by patient prior to visit?: no Living arrangements:: lives with spouse/significant other Patient's Overall Health Status Rating: very good Typical amount of pain: none Does pain affect daily life?: no Are you currently prescribed opioids?: no  Dietary Habits and Nutritional Risks How many meals a day?: 3 Eats fruit and vegetables daily?: yes Most meals are obtained by: preparing own meals In the last 2 weeks, have you had any of the following?: none Diabetic:: no  Functional Status Activities of Daily Living (to include ambulation/medication): Independent Ambulation: Independent with device- listed below Home Assistive Devices/Equipment: Eyeglasses Medication Administration: Independent Home Management (perform basic housework or laundry): Independent Manage your own finances?: yes Primary transportation is: driving Concerns about vision?: no *vision screening is required for WTM* Concerns about hearing?: no  Fall Screening Falls in the past year?: 0 Number of  falls in past year: 0 Was there an injury with Fall?: 0 Fall Risk Category Calculator: 0 Patient Fall Risk Level: Low Fall Risk  Fall Risk Patient at Risk for Falls Due to: No Fall Risks Fall risk Follow up: Falls evaluation completed  Home and Transportation Safety: All rugs have non-skid backing?: yes All stairs or steps have railings?: N/A, no stairs Grab bars in the bathtub or shower?: yes Have non-skid surface in bathtub or shower?: yes Good home lighting?: yes Regular seat belt use?: yes Hospital stays in the last year:: no  Cognitive Assessment Difficulty concentrating, remembering, or making decisions? : no Will 6CIT or Mini Cog be Completed: yes What year is it?: 0 points What month is it?: 0 points Give patient an address phrase to remember (5 components): [address] [place]  About what time is it?: 0 points Count backwards from 20 to 1: 0 points Say the months of the year in reverse: 0 points Repeat the address phrase from earlier: 0 points 6 CIT Score: 0 points  Advance Directives (For Healthcare) Does Patient Have a Medical Advance Directive?: Yes Does patient want to make changes to medical advance directive?: No - Patient declined Type of Advance Directive: Healthcare Power of [place]; Living will Copy of Healthcare Power of Attorney in Chart?: No - copy requested Copy of Living Will in Chart?: No - copy requested  Reviewed/Updated  Reviewed/Updated: Reviewed All (Medical, Surgical, Family, Medications, Allergies, Care Teams, Patient Goals)    Allergies (verified) Patient has no known allergies.   Current Medications (verified) Outpatient Encounter Medications as of 12/23/2024  Medication Sig   carvedilol  (COREG ) 12.5 MG tablet Take 1 tablet (12.5 mg total) by mouth 2 (two) times daily.   Cholecalciferol (VITAMIN D3) POWD Take by mouth.   Cyanocobalamin  (VITAMIN B-12 PO) Take by mouth.   ezetimibe  (ZETIA )  10 MG tablet Take 1 tablet (10 mg  total) by mouth daily.   hydrochlorothiazide  (HYDRODIURIL ) 25 MG tablet Take 1 tablet (25 mg total) by mouth daily.   meloxicam  (MOBIC ) 7.5 MG tablet Take 1 tablet (7.5 mg total) by mouth daily as needed for pain.   metoprolol  tartrate (LOPRESSOR ) 50 MG tablet Take one (1) 50 mg tablet two (2) hours prior to scan.   No facility-administered encounter medications on file as of 12/23/2024.    History: Past Medical History:  Diagnosis Date   Atypical mole 04/11/2007   SEVERE LEFT MID BACK- WIDER SHAVE   Atypical mole 01/15/2014   MODERATE-LEFT THIGH UPPER- NO TREATMENT   Atypical mole 08/03/2015   MODERATE-LEFT LOWER BACK- NO TREATMENT   Atypical mole 08/03/2015   SEVERE-LEFT UPPER PARASPINAL- WIDER SHAVE   Atypical mole 08/03/2015   MILD-RIGHT INNER KNEE-NO TREATMENT   Atypical mole 11/15/2015   SEVERE-RIGHT POST THIGH-WIDER SHAVE   Atypical mole 12/07/2016   MILD-LEFT LOW BACK- NO TREATMENT   B12 deficiency 04/17/2019   Basal cell carcinoma 01/15/2014   nodular chest-cx3,5FU   BCC (basal cell carcinoma of skin) 03/07/2017   NODULAR CHEST-TX AFTER BIOPSY   Essential hypertension 11/19/2014   Heart murmur    HTN (hypertension)    Hypercholesteremia    Melanoma in situ (HCC) 01/15/2014   LOWER LEFT BACK- MOHS DR. MERRITT   Melanoma of skin (HCC) 08/25/2015   sees dermatologist at least twice per year, Dr. Livingston   Osteoarthritis 11/19/2014   Osteoarthrosis, unspecified whether generalized or localized, involving lower leg 11/19/2014   of knees, s/p knee replacements bilat, sees [facility] ortho   Osteopenia 01/04/2017   Palpitations 08/25/2015   Palpitations    Pneumonia    Pure hypercholesterolemia 11/19/2014   SCCA (squamous cell carcinoma) of skin 11/13/2017   MID CHEST-C X 3, 5FU   Vaginal atrophy 08/25/2015   Past Surgical History:  Procedure Laterality Date   APPENDECTOMY  2009   CESAREAN SECTION     JOINT REPLACEMENT Bilateral    knees   KNEE SURGERY  2009   left    OPEN REDUCTION INTERNAL FIXATION (ORIF) METACARPAL Left 06/19/2018   Procedure: LEFT SMALL FINGER OPEN REDUCTION INTERNAL FIXATION (ORIF) CARPOMETACARPAL FUSION;  Surgeon: Murrell Drivers, MD;  Location: [facility];  Service: Orthopedics;  Laterality: Left;   TOTAL HIP ARTHROPLASTY Right 06/14/2020   Procedure: TOTAL HIP ARTHROPLASTY ANTERIOR APPROACH;  Surgeon: Ernie Cough, MD;  Location: [facility];  Service: Orthopedics;  Laterality: Right;  70 mins   WRIST SURGERY  2007   right   Family History  Problem Relation Age of Onset   Heart disease Other    Social History   Occupational History   Occupation: transport planner  Tobacco Use   Smoking status: Never   Smokeless tobacco: Never  Vaping Use   Vaping status: Never Used  Substance and Sexual Activity   Alcohol use: Yes    Alcohol/week: 3.0 standard drinks of alcohol    Types: 3 Glasses of wine per week    Comment: social   Drug use: Never   Sexual activity: Not Currently   Tobacco Counseling Counseling given: No  SDOH Screenings   Food Insecurity: No Food Insecurity (12/23/2024)  Housing: Unknown (12/23/2024)  Transportation Needs: No Transportation Needs (12/23/2024)  Utilities: Not At Risk (12/23/2024)  Alcohol Screen: Low Risk (10/12/2024)  Depression (PHQ2-9): Low Risk (12/23/2024)  Financial Resource Strain: Low Risk (10/12/2024)  Physical Activity: Sufficiently Active (  12/23/2024)  Social Connections: Socially Integrated (12/23/2024)  Recent Concern: Social Connections - Moderately Isolated (10/12/2024)  Stress: No Stress Concern Present (12/23/2024)  Tobacco Use: Low Risk (12/23/2024)  Health Literacy: Adequate Health Literacy (12/23/2024)   See flowsheets for full screening details  Depression Screen PHQ 2 & 9 Depression Scale- Over the past 2 weeks, how often have you been bothered by any of the following problems? Little interest or pleasure in doing things: 0 Feeling down, depressed, or hopeless (PHQ  Adolescent also includes...irritable): 0 PHQ-2 Total Score: 0     Goals Addressed               This Visit's Progress     Continue to Increase physical activity (pt-stated)               Objective:    Today's Vitals   12/23/24 0834  Weight: 158 lb (71.7 kg)  Height: 5' 4 (1.626 m)   Body mass index is 27.12 kg/m.  Hearing/Vision screen Hearing Screening - Comments:: Denies hearing difficulties   Vision Screening - Comments:: Wears rx glasses - up to date with routine eye exams with  [facility] Immunizations and Health Maintenance Health Maintenance  Topic Date Due   Influenza Vaccine  07/03/2024   COVID-19 Vaccine (8 - 2025-26 season) 08/03/2024   Medicare Annual Wellness (AWV)  12/23/2025   Mammogram  06/15/2026   Colonoscopy  08/15/2026   DTaP/Tdap/Td (3 - Td or Tdap) 04/12/2028   Pneumococcal Vaccine: 50+ Years  Completed   Bone Density Scan  Completed   Hepatitis C Screening  Completed   Zoster Vaccines- Shingrix  Completed   Meningococcal B Vaccine  Aged Out        Assessment/Plan:  This is a routine wellness examination for [place].  Patient Care Team: Ozell Heron HERO, MD as PCP - General (Family Medicine) Michele Richardson, DO as PCP - Cardiology (Cardiology) Porter Andrez SAUNDERS, PA-C (Inactive) as Physician Assistant (Dermatology)  I have personally reviewed and noted the following in the patients chart:   Medical and social history Use of alcohol, tobacco or illicit drugs  Current medications and supplements including opioid prescriptions. Functional ability and status Nutritional status Physical activity Advanced directives List of other physicians Hospitalizations, surgeries, and ER visits in previous 12 months Vitals Screenings to include cognitive, depression, and falls Referrals and appointments  No orders of the defined types were placed in this encounter.  In addition, I have reviewed and discussed with patient certain  preventive protocols, quality metrics, and best practice recommendations. A written personalized care plan for preventive services as well as general preventive health recommendations were provided to patient.   Rojelio LELON Blush, LPN   8/78/7973   Return in 53 weeks (on 12/29/2025).  After Visit Summary: (MyChart) Due to this being a telephonic visit, the after visit summary with patients personalized plan was offered to patient via MyChart   Nurse Notes: No voiced or noted concerns at this time "

## 2024-12-25 MED ORDER — EZETIMIBE 10 MG PO TABS: 10.0000 mg | ORAL_TABLET | Freq: Every day | ORAL | 3 refills | Status: AC

## 2024-12-30 ENCOUNTER — Encounter (HOSPITAL_COMMUNITY): Payer: Self-pay

## 2025-01-01 ENCOUNTER — Ambulatory Visit (HOSPITAL_COMMUNITY)
Admission: RE | Admit: 2025-01-01 | Discharge: 2025-01-01 | Disposition: A | Source: Ambulatory Visit | Attending: Cardiovascular Disease | Admitting: Cardiovascular Disease

## 2025-01-01 DIAGNOSIS — R931 Abnormal findings on diagnostic imaging of heart and coronary circulation: Secondary | ICD-10-CM | POA: Insufficient documentation

## 2025-01-01 DIAGNOSIS — R6889 Other general symptoms and signs: Secondary | ICD-10-CM | POA: Insufficient documentation

## 2025-01-01 DIAGNOSIS — R9431 Abnormal electrocardiogram [ECG] [EKG]: Secondary | ICD-10-CM | POA: Insufficient documentation

## 2025-01-01 MED ORDER — NITROGLYCERIN 0.4 MG SL SUBL
0.8000 mg | SUBLINGUAL_TABLET | Freq: Once | SUBLINGUAL | Status: AC
  Administered 2025-01-01: 0.8 mg via SUBLINGUAL

## 2025-01-01 MED ORDER — IOHEXOL 350 MG/ML SOLN
100.0000 mL | Freq: Once | INTRAVENOUS | Status: AC | PRN
  Administered 2025-01-01: 100 mL via INTRAVENOUS

## 2025-01-04 ENCOUNTER — Ambulatory Visit: Payer: Self-pay | Admitting: Cardiology

## 2025-01-04 DIAGNOSIS — E785 Hyperlipidemia, unspecified: Secondary | ICD-10-CM

## 2025-01-04 DIAGNOSIS — E7800 Pure hypercholesterolemia, unspecified: Secondary | ICD-10-CM

## 2025-01-04 MED ORDER — ROSUVASTATIN CALCIUM 10 MG PO TABS: 10.0000 mg | ORAL_TABLET | Freq: Every evening | ORAL | 2 refills | Status: AC

## 2025-01-12 ENCOUNTER — Ambulatory Visit (HOSPITAL_COMMUNITY): Admission: RE | Admit: 2025-01-12 | Source: Ambulatory Visit

## 2025-12-29 ENCOUNTER — Ambulatory Visit
